# Patient Record
Sex: Female | Born: 1935 | Race: White | Hispanic: No | Marital: Married | Smoking: Former smoker
Health system: Southern US, Community
[De-identification: ages and names within clinical notes are randomized; demographics above are authoritative.]

## PROBLEM LIST (undated history)

## (undated) DIAGNOSIS — M48061 Spinal stenosis, lumbar region without neurogenic claudication: Secondary | ICD-10-CM

## (undated) DIAGNOSIS — Z972 Presence of dental prosthetic device (complete) (partial): Secondary | ICD-10-CM

## (undated) DIAGNOSIS — R112 Nausea with vomiting, unspecified: Secondary | ICD-10-CM

## (undated) DIAGNOSIS — Z973 Presence of spectacles and contact lenses: Secondary | ICD-10-CM

## (undated) DIAGNOSIS — K219 Gastro-esophageal reflux disease without esophagitis: Secondary | ICD-10-CM

## (undated) DIAGNOSIS — E1140 Type 2 diabetes mellitus with diabetic neuropathy, unspecified: Secondary | ICD-10-CM

## (undated) DIAGNOSIS — E7800 Pure hypercholesterolemia, unspecified: Secondary | ICD-10-CM

## (undated) DIAGNOSIS — E039 Hypothyroidism, unspecified: Secondary | ICD-10-CM

## (undated) DIAGNOSIS — Z9889 Other specified postprocedural states: Secondary | ICD-10-CM

## (undated) DIAGNOSIS — I10 Essential (primary) hypertension: Secondary | ICD-10-CM

## (undated) DIAGNOSIS — M1990 Unspecified osteoarthritis, unspecified site: Secondary | ICD-10-CM

## (undated) HISTORY — PX: DILATION AND CURETTAGE OF UTERUS: SHX78

## (undated) HISTORY — PX: KNEE ARTHROSCOPY W/ MENISCAL REPAIR: SHX1877

## (undated) HISTORY — PX: JOINT REPLACEMENT: SHX530

## (undated) HISTORY — PX: TOOTH EXTRACTION: SHX859

## (undated) HISTORY — PX: COLONOSCOPY: SHX174

## (undated) HISTORY — PX: COLONOSCOPY W/ POLYPECTOMY: SHX1380

---

## 1976-02-23 HISTORY — PX: ABDOMINAL HYSTERECTOMY: SHX81

## 2004-08-20 ENCOUNTER — Ambulatory Visit: Payer: Self-pay | Admitting: Unknown Physician Specialty

## 2005-10-07 ENCOUNTER — Ambulatory Visit: Payer: Self-pay | Admitting: Unknown Physician Specialty

## 2005-11-01 ENCOUNTER — Ambulatory Visit: Payer: Self-pay | Admitting: Gastroenterology

## 2006-09-27 ENCOUNTER — Ambulatory Visit: Payer: Self-pay | Admitting: Unknown Physician Specialty

## 2006-11-10 ENCOUNTER — Ambulatory Visit: Payer: Self-pay | Admitting: Unknown Physician Specialty

## 2008-01-04 ENCOUNTER — Ambulatory Visit: Payer: Self-pay | Admitting: Unknown Physician Specialty

## 2008-12-04 ENCOUNTER — Ambulatory Visit: Payer: Self-pay | Admitting: Unknown Physician Specialty

## 2009-01-07 ENCOUNTER — Ambulatory Visit: Payer: Self-pay | Admitting: Unknown Physician Specialty

## 2009-01-22 ENCOUNTER — Encounter: Admission: RE | Admit: 2009-01-22 | Discharge: 2009-01-22 | Payer: Self-pay | Admitting: Neurosurgery

## 2009-03-21 ENCOUNTER — Encounter: Admission: RE | Admit: 2009-03-21 | Discharge: 2009-03-21 | Payer: Self-pay | Admitting: Neurosurgery

## 2009-05-30 ENCOUNTER — Encounter: Admission: RE | Admit: 2009-05-30 | Discharge: 2009-05-30 | Payer: Self-pay | Admitting: Neurosurgery

## 2009-07-14 ENCOUNTER — Ambulatory Visit: Payer: Self-pay | Admitting: General Practice

## 2009-08-04 ENCOUNTER — Ambulatory Visit: Payer: Self-pay | Admitting: General Practice

## 2009-08-08 ENCOUNTER — Ambulatory Visit: Payer: Self-pay | Admitting: General Practice

## 2010-02-13 ENCOUNTER — Encounter
Admission: RE | Admit: 2010-02-13 | Discharge: 2010-02-13 | Payer: Self-pay | Source: Home / Self Care | Attending: Orthopedic Surgery | Admitting: Orthopedic Surgery

## 2010-02-18 ENCOUNTER — Ambulatory Visit: Payer: Self-pay | Admitting: Unknown Physician Specialty

## 2010-03-30 ENCOUNTER — Ambulatory Visit: Payer: Self-pay | Admitting: Anesthesiology

## 2010-04-03 ENCOUNTER — Ambulatory Visit: Payer: Self-pay | Admitting: General Practice

## 2010-11-16 ENCOUNTER — Ambulatory Visit: Payer: Self-pay | Admitting: General Practice

## 2010-12-02 ENCOUNTER — Inpatient Hospital Stay: Payer: Self-pay | Admitting: General Practice

## 2011-03-02 ENCOUNTER — Ambulatory Visit: Payer: Self-pay | Admitting: Unknown Physician Specialty

## 2011-06-09 ENCOUNTER — Ambulatory Visit: Payer: Self-pay | Admitting: Internal Medicine

## 2011-06-10 ENCOUNTER — Other Ambulatory Visit: Payer: Self-pay | Admitting: Rheumatology

## 2011-06-10 ENCOUNTER — Other Ambulatory Visit: Payer: Self-pay | Admitting: *Deleted

## 2011-06-10 DIAGNOSIS — M79651 Pain in right thigh: Secondary | ICD-10-CM

## 2011-06-15 ENCOUNTER — Ambulatory Visit
Admission: RE | Admit: 2011-06-15 | Discharge: 2011-06-15 | Disposition: A | Discharge status: Home / Self Care | Payer: Medicare Other | Source: Ambulatory Visit | Attending: *Deleted | Admitting: *Deleted

## 2011-06-15 DIAGNOSIS — M79651 Pain in right thigh: Secondary | ICD-10-CM

## 2011-06-30 ENCOUNTER — Other Ambulatory Visit: Payer: Self-pay | Admitting: Rheumatology

## 2011-06-30 DIAGNOSIS — M79651 Pain in right thigh: Secondary | ICD-10-CM

## 2011-07-08 ENCOUNTER — Other Ambulatory Visit: Payer: Self-pay | Admitting: Physical Medicine and Rehabilitation

## 2011-07-08 DIAGNOSIS — M545 Low back pain: Secondary | ICD-10-CM

## 2011-07-13 ENCOUNTER — Ambulatory Visit
Admission: RE | Admit: 2011-07-13 | Discharge: 2011-07-13 | Disposition: A | Discharge status: Home / Self Care | Payer: Medicare Other | Source: Ambulatory Visit | Attending: Physical Medicine and Rehabilitation | Admitting: Physical Medicine and Rehabilitation

## 2011-07-13 DIAGNOSIS — M545 Low back pain: Secondary | ICD-10-CM

## 2011-08-13 ENCOUNTER — Encounter (HOSPITAL_COMMUNITY): Payer: Self-pay | Admitting: Pharmacy Technician

## 2011-08-13 ENCOUNTER — Other Ambulatory Visit: Payer: Self-pay | Admitting: Neurosurgery

## 2011-08-16 ENCOUNTER — Encounter (HOSPITAL_COMMUNITY)
Admission: RE | Admit: 2011-08-16 | Discharge: 2011-08-16 | Disposition: A | Discharge status: Home / Self Care | Payer: Medicare Other | Source: Ambulatory Visit | Attending: Neurosurgery | Admitting: Neurosurgery

## 2011-08-16 ENCOUNTER — Encounter (HOSPITAL_COMMUNITY): Payer: Self-pay

## 2011-08-16 HISTORY — DX: Pure hypercholesterolemia, unspecified: E78.00

## 2011-08-16 HISTORY — DX: Other specified postprocedural states: Z98.890

## 2011-08-16 HISTORY — DX: Essential (primary) hypertension: I10

## 2011-08-16 HISTORY — DX: Other specified postprocedural states: R11.2

## 2011-08-16 HISTORY — DX: Gastro-esophageal reflux disease without esophagitis: K21.9

## 2011-08-16 HISTORY — DX: Unspecified osteoarthritis, unspecified site: M19.90

## 2011-08-16 LAB — BASIC METABOLIC PANEL
CO2: 26 mEq/L (ref 19–32)
Calcium: 10 mg/dL (ref 8.4–10.5)
Creatinine, Ser: 1.17 mg/dL — ABNORMAL HIGH (ref 0.50–1.10)
GFR calc Af Amer: 52 mL/min — ABNORMAL LOW (ref >90)
GFR calc non Af Amer: 45 mL/min — ABNORMAL LOW (ref >90)

## 2011-08-16 LAB — CBC
MCV: 89.3 fL (ref 78.0–100.0)
Platelets: 397 10*3/uL (ref 150–400)
RDW: 14.1 % (ref 11.5–15.5)
WBC: 9.7 10*3/uL (ref 4.0–10.5)

## 2011-08-16 LAB — SURGICAL PCR SCREEN: MRSA, PCR: NEGATIVE

## 2011-08-16 NOTE — Pre-Procedure Instructions (Signed)
22 Paula Hansen  08/16/2011   Your procedure is scheduled on:  Tuesday August 17, 2011.  Report to [facility] at 0830 AM.  Call this number if you have problems the morning of surgery: [phone]   Remember:   Do not eat food:After Midnight.    Take these medicines the morning of surgery with A SIP OF WATER: Esomeprazole (Nexium), Gabapentin (Neurontin), and Tapentadol.   Do not wear jewelry, make-up or nail polish.  Do not wear lotions, powders, or perfumes.   Do not shave 48 hours prior to surgery.   Do not bring valuables to the hospital.  Contacts, dentures or bridgework may not be worn into surgery.  Leave suitcase in the car. After surgery it may be brought to your room.  For patients admitted to the hospital, checkout time is 11:00 AM the day of discharge.   Patients discharged the day of surgery will not be allowed to drive home.  Name and phone number of your driver:   Special Instructions: CHG Shower Use Special Wash: 1/2 bottle night before surgery and 1/2 bottle morning of surgery.   Please read over the following fact sheets that you were given: Pain Booklet, Coughing and Deep Breathing, MRSA Information and Surgical Site Infection Prevention

## 2011-08-17 ENCOUNTER — Encounter (HOSPITAL_COMMUNITY): Payer: Self-pay | Admitting: *Deleted

## 2011-08-17 ENCOUNTER — Encounter (HOSPITAL_COMMUNITY): Payer: Self-pay | Admitting: Surgery

## 2011-08-17 ENCOUNTER — Inpatient Hospital Stay (HOSPITAL_COMMUNITY)
Admission: RE | Admit: 2011-08-17 | Discharge: 2011-08-18 | DRG: 491 | Disposition: A | Discharge status: Home / Self Care | Payer: Medicare Other | Source: Ambulatory Visit | Attending: Neurosurgery | Admitting: Neurosurgery

## 2011-08-17 ENCOUNTER — Encounter (HOSPITAL_COMMUNITY)
Admission: RE | Disposition: A | Discharge status: Home / Self Care | Payer: Self-pay | Source: Ambulatory Visit | Attending: Neurosurgery

## 2011-08-17 ENCOUNTER — Inpatient Hospital Stay (HOSPITAL_COMMUNITY): Payer: Medicare Other | Admitting: *Deleted

## 2011-08-17 ENCOUNTER — Inpatient Hospital Stay (HOSPITAL_COMMUNITY): Payer: Medicare Other

## 2011-08-17 DIAGNOSIS — E119 Type 2 diabetes mellitus without complications: Secondary | ICD-10-CM | POA: Diagnosis present

## 2011-08-17 DIAGNOSIS — Z01812 Encounter for preprocedural laboratory examination: Secondary | ICD-10-CM

## 2011-08-17 DIAGNOSIS — M5126 Other intervertebral disc displacement, lumbar region: Principal | ICD-10-CM | POA: Diagnosis present

## 2011-08-17 DIAGNOSIS — K219 Gastro-esophageal reflux disease without esophagitis: Secondary | ICD-10-CM | POA: Diagnosis present

## 2011-08-17 DIAGNOSIS — I10 Essential (primary) hypertension: Secondary | ICD-10-CM | POA: Diagnosis present

## 2011-08-17 HISTORY — PX: LUMBAR LAMINECTOMY/DECOMPRESSION MICRODISCECTOMY: SHX5026

## 2011-08-17 LAB — URINE MICROSCOPIC-ADD ON

## 2011-08-17 LAB — PROTIME-INR: INR: 0.98 (ref 0.00–1.49)

## 2011-08-17 LAB — DIFFERENTIAL
Basophils Relative: 0 % (ref 0–1)
Eosinophils Absolute: 0.1 10*3/uL (ref 0.0–0.7)
Eosinophils Relative: 1 % (ref 0–5)
Monocytes Relative: 12 % (ref 3–12)
Neutrophils Relative %: 70 % (ref 43–77)

## 2011-08-17 LAB — URINALYSIS, ROUTINE W REFLEX MICROSCOPIC
Glucose, UA: NEGATIVE mg/dL
Hgb urine dipstick: NEGATIVE
Ketones, ur: 15 mg/dL — AB
Nitrite: NEGATIVE
pH: 5.5 (ref 5.0–8.0)

## 2011-08-17 LAB — GLUCOSE, CAPILLARY
Glucose-Capillary: 139 mg/dL — ABNORMAL HIGH (ref 70–99)
Glucose-Capillary: 98 mg/dL (ref 70–99)

## 2011-08-17 SURGERY — LUMBAR LAMINECTOMY/DECOMPRESSION MICRODISCECTOMY 1 LEVEL
Anesthesia: General | Site: Back | Laterality: Right | Wound class: Clean

## 2011-08-17 MED ORDER — FENTANYL CITRATE 0.05 MG/ML IJ SOLN
INTRAMUSCULAR | Status: DC | PRN
  Administered 2011-08-17 (×2): 50 ug via INTRAVENOUS
  Administered 2011-08-17: 25 ug via INTRAVENOUS
  Administered 2011-08-17: 50 ug via INTRAVENOUS
  Administered 2011-08-17: 25 ug via INTRAVENOUS
  Administered 2011-08-17: 100 ug via INTRAVENOUS
  Administered 2011-08-17 (×2): 50 ug via INTRAVENOUS
  Administered 2011-08-17: 100 ug via INTRAVENOUS

## 2011-08-17 MED ORDER — HEMOSTATIC AGENTS (NO CHARGE) OPTIME
TOPICAL | Status: DC | PRN
  Administered 2011-08-17: 1 via TOPICAL

## 2011-08-17 MED ORDER — OXYCODONE-ACETAMINOPHEN 5-325 MG PO TABS: 1.0000 | ORAL_TABLET | ORAL | Status: DC | PRN

## 2011-08-17 MED ORDER — VANCOMYCIN HCL 1000 MG IV SOLR
750.0000 mg | Freq: Two times a day (BID) | INTRAVENOUS | Status: DC
  Administered 2011-08-18: 750 mg via INTRAVENOUS
  Filled 2011-08-17 (×4): qty 750

## 2011-08-17 MED ORDER — PROMETHAZINE HCL 25 MG/ML IJ SOLN: 12.5000 mg | INTRAMUSCULAR | Status: DC | PRN

## 2011-08-17 MED ORDER — METFORMIN HCL 500 MG PO TABS
500.0000 mg | ORAL_TABLET | Freq: Two times a day (BID) | ORAL | Status: DC
  Administered 2011-08-17 – 2011-08-18 (×2): 500 mg via ORAL
  Filled 2011-08-17 (×4): qty 1

## 2011-08-17 MED ORDER — CYCLOBENZAPRINE HCL 10 MG PO TABS: 10.0000 mg | ORAL_TABLET | Freq: Three times a day (TID) | ORAL | Status: DC | PRN

## 2011-08-17 MED ORDER — ROCURONIUM BROMIDE 100 MG/10ML IV SOLN
INTRAVENOUS | Status: DC | PRN
  Administered 2011-08-17: 50 mg via INTRAVENOUS

## 2011-08-17 MED ORDER — 0.9 % SODIUM CHLORIDE (POUR BTL) OPTIME
TOPICAL | Status: DC | PRN
  Administered 2011-08-17: 1000 mL

## 2011-08-17 MED ORDER — SODIUM CHLORIDE 0.9 % IV SOLN
INTRAVENOUS | Status: AC
  Filled 2011-08-17: qty 500

## 2011-08-17 MED ORDER — METHOCARBAMOL 100 MG/ML IJ SOLN
500.0000 mg | Freq: Four times a day (QID) | INTRAMUSCULAR | Status: DC | PRN
  Filled 2011-08-17: qty 5

## 2011-08-17 MED ORDER — PANTOPRAZOLE SODIUM 40 MG PO TBEC
80.0000 mg | DELAYED_RELEASE_TABLET | Freq: Every day | ORAL | Status: DC
  Administered 2011-08-18: 80 mg via ORAL
  Filled 2011-08-17: qty 2

## 2011-08-17 MED ORDER — DROPERIDOL 2.5 MG/ML IJ SOLN: 0.6250 mg | INTRAMUSCULAR | Status: DC | PRN

## 2011-08-17 MED ORDER — KETOROLAC TROMETHAMINE 30 MG/ML IJ SOLN
30.0000 mg | Freq: Four times a day (QID) | INTRAMUSCULAR | Status: DC
  Administered 2011-08-17 – 2011-08-18 (×3): 30 mg via INTRAVENOUS
  Filled 2011-08-17 (×4): qty 1

## 2011-08-17 MED ORDER — BENAZEPRIL HCL 20 MG PO TABS
20.0000 mg | ORAL_TABLET | Freq: Every day | ORAL | Status: DC
  Administered 2011-08-17 – 2011-08-18 (×2): 20 mg via ORAL
  Filled 2011-08-17 (×2): qty 1

## 2011-08-17 MED ORDER — METHOCARBAMOL 500 MG PO TABS
500.0000 mg | ORAL_TABLET | Freq: Four times a day (QID) | ORAL | Status: DC | PRN
  Administered 2011-08-17: 500 mg via ORAL
  Filled 2011-08-17: qty 1

## 2011-08-17 MED ORDER — GABAPENTIN 300 MG PO CAPS
300.0000 mg | ORAL_CAPSULE | Freq: Three times a day (TID) | ORAL | Status: DC
  Administered 2011-08-17 – 2011-08-18 (×3): 300 mg via ORAL
  Filled 2011-08-17 (×5): qty 1

## 2011-08-17 MED ORDER — ADULT MULTIVITAMIN W/MINERALS CH
1.0000 | ORAL_TABLET | Freq: Every day | ORAL | Status: DC
  Administered 2011-08-17 – 2011-08-18 (×2): 1 via ORAL
  Filled 2011-08-17 (×2): qty 1

## 2011-08-17 MED ORDER — HYDROMORPHONE HCL PF 1 MG/ML IJ SOLN
0.2500 mg | INTRAMUSCULAR | Status: DC | PRN
  Administered 2011-08-17: 0.5 mg via INTRAVENOUS

## 2011-08-17 MED ORDER — GLYCOPYRROLATE 0.2 MG/ML IJ SOLN
INTRAMUSCULAR | Status: DC | PRN
  Administered 2011-08-17: .4 mg via INTRAVENOUS

## 2011-08-17 MED ORDER — ATORVASTATIN CALCIUM 20 MG PO TABS
20.0000 mg | ORAL_TABLET | Freq: Every day | ORAL | Status: DC
  Filled 2011-08-17 (×2): qty 1

## 2011-08-17 MED ORDER — AMLODIPINE BESYLATE 10 MG PO TABS
10.0000 mg | ORAL_TABLET | Freq: Every day | ORAL | Status: DC
  Administered 2011-08-17 – 2011-08-18 (×2): 10 mg via ORAL
  Filled 2011-08-17 (×2): qty 1

## 2011-08-17 MED ORDER — NEOSTIGMINE METHYLSULFATE 1 MG/ML IJ SOLN
INTRAMUSCULAR | Status: DC | PRN
  Administered 2011-08-17: 3 mg via INTRAVENOUS

## 2011-08-17 MED ORDER — ONDANSETRON HCL 4 MG/2ML IJ SOLN
INTRAMUSCULAR | Status: DC | PRN
  Administered 2011-08-17: 4 mg via INTRAVENOUS

## 2011-08-17 MED ORDER — ZOLPIDEM TARTRATE 5 MG PO TABS: 10.0000 mg | ORAL_TABLET | Freq: Every evening | ORAL | Status: DC | PRN

## 2011-08-17 MED ORDER — GLIPIZIDE 5 MG PO TABS
5.0000 mg | ORAL_TABLET | Freq: Two times a day (BID) | ORAL | Status: DC
  Administered 2011-08-17 – 2011-08-18 (×2): 5 mg via ORAL
  Filled 2011-08-17 (×4): qty 1

## 2011-08-17 MED ORDER — SODIUM CHLORIDE 0.9 % IJ SOLN: 3.0000 mL | INTRAMUSCULAR | Status: DC | PRN

## 2011-08-17 MED ORDER — ONDANSETRON HCL 4 MG/2ML IJ SOLN: 4.0000 mg | INTRAMUSCULAR | Status: DC | PRN

## 2011-08-17 MED ORDER — AMLODIPINE BESY-BENAZEPRIL HCL 10-20 MG PO CAPS: 1.0000 | ORAL_CAPSULE | Freq: Every day | ORAL | Status: DC

## 2011-08-17 MED ORDER — ACETAMINOPHEN 325 MG PO TABS: 650.0000 mg | ORAL_TABLET | ORAL | Status: DC | PRN

## 2011-08-17 MED ORDER — MIDAZOLAM HCL 5 MG/5ML IJ SOLN
INTRAMUSCULAR | Status: DC | PRN
  Administered 2011-08-17 (×2): 1 mg via INTRAVENOUS

## 2011-08-17 MED ORDER — POTASSIUM CHLORIDE IN NACL 20-0.9 MEQ/L-% IV SOLN
INTRAVENOUS | Status: DC
  Filled 2011-08-17 (×3): qty 1000

## 2011-08-17 MED ORDER — TRAMADOL-ACETAMINOPHEN 37.5-325 MG PO TABS
1.0000 | ORAL_TABLET | ORAL | Status: DC | PRN
  Administered 2011-08-17: 1 via ORAL
  Filled 2011-08-17: qty 2

## 2011-08-17 MED ORDER — FENTANYL 25 MCG/HR TD PT72
25.0000 ug | MEDICATED_PATCH | TRANSDERMAL | Status: DC
  Administered 2011-08-17: 25 ug via TRANSDERMAL
  Filled 2011-08-17: qty 1

## 2011-08-17 MED ORDER — DOCUSATE SODIUM 100 MG PO CAPS
100.0000 mg | ORAL_CAPSULE | Freq: Two times a day (BID) | ORAL | Status: DC
  Administered 2011-08-17 – 2011-08-18 (×3): 100 mg via ORAL
  Filled 2011-08-17 (×3): qty 1

## 2011-08-17 MED ORDER — HYDROCODONE-ACETAMINOPHEN 5-325 MG PO TABS: 1.0000 | ORAL_TABLET | ORAL | Status: DC | PRN

## 2011-08-17 MED ORDER — MAGNESIUM HYDROXIDE 400 MG/5ML PO SUSP: 30.0000 mL | Freq: Every day | ORAL | Status: DC | PRN

## 2011-08-17 MED ORDER — LIDOCAINE HCL (CARDIAC) 20 MG/ML IV SOLN
INTRAVENOUS | Status: DC | PRN
  Administered 2011-08-17: 100 mg via INTRAVENOUS

## 2011-08-17 MED ORDER — PROPOFOL 10 MG/ML IV EMUL
INTRAVENOUS | Status: DC | PRN
  Administered 2011-08-17: 180 mg via INTRAVENOUS

## 2011-08-17 MED ORDER — THROMBIN 5000 UNITS EX KIT
PACK | CUTANEOUS | Status: DC | PRN
  Administered 2011-08-17 (×2): 5000 [IU] via TOPICAL

## 2011-08-17 MED ORDER — SODIUM CHLORIDE 0.9 % IR SOLN
Status: DC | PRN
  Administered 2011-08-17: 13:00:00

## 2011-08-17 MED ORDER — ACETAMINOPHEN 650 MG RE SUPP: 650.0000 mg | RECTAL | Status: DC | PRN

## 2011-08-17 MED ORDER — HYDROMORPHONE HCL PF 1 MG/ML IJ SOLN
INTRAMUSCULAR | Status: AC
  Filled 2011-08-17: qty 1

## 2011-08-17 MED ORDER — VANCOMYCIN HCL IN DEXTROSE 1-5 GM/200ML-% IV SOLN
INTRAVENOUS | Status: AC
  Administered 2011-08-17: 1000 mg via INTRAVENOUS
  Filled 2011-08-17: qty 200

## 2011-08-17 MED ORDER — VANCOMYCIN HCL 750 MG IV SOLR: 750.0000 mg | Freq: Two times a day (BID) | INTRAVENOUS | Status: DC

## 2011-08-17 MED ORDER — SODIUM CHLORIDE 0.9 % IJ SOLN
3.0000 mL | Freq: Two times a day (BID) | INTRAMUSCULAR | Status: DC
  Administered 2011-08-17 – 2011-08-18 (×3): 3 mL via INTRAVENOUS

## 2011-08-17 MED ORDER — BACITRACIN 50000 UNITS IM SOLR
INTRAMUSCULAR | Status: AC
  Filled 2011-08-17: qty 1

## 2011-08-17 MED ORDER — DEXTROSE 5 % IV SOLN
INTRAVENOUS | Status: DC | PRN
  Administered 2011-08-17: 12:00:00 via INTRAVENOUS

## 2011-08-17 MED ORDER — LACTATED RINGERS IV SOLN
INTRAVENOUS | Status: DC | PRN
  Administered 2011-08-17 (×2): via INTRAVENOUS

## 2011-08-17 MED ORDER — LIDOCAINE-EPINEPHRINE 1 %-1:100000 IJ SOLN
INTRAMUSCULAR | Status: DC | PRN
  Administered 2011-08-17: 20 mL

## 2011-08-17 MED ORDER — ASPIRIN EC 81 MG PO TBEC
81.0000 mg | DELAYED_RELEASE_TABLET | Freq: Every day | ORAL | Status: DC
  Administered 2011-08-17 – 2011-08-18 (×2): 81 mg via ORAL
  Filled 2011-08-17 (×2): qty 1

## 2011-08-17 MED ORDER — CEFAZOLIN SODIUM 1-5 GM-% IV SOLN: 1.0000 g | INTRAVENOUS | Status: DC

## 2011-08-17 MED ORDER — KETOROLAC TROMETHAMINE 30 MG/ML IJ SOLN
INTRAMUSCULAR | Status: AC
  Administered 2011-08-17: 30 mg
  Filled 2011-08-17: qty 1

## 2011-08-17 MED ORDER — LORATADINE 10 MG PO TABS
10.0000 mg | ORAL_TABLET | Freq: Every day | ORAL | Status: DC
  Administered 2011-08-17 – 2011-08-18 (×2): 10 mg via ORAL
  Filled 2011-08-17 (×2): qty 1

## 2011-08-17 MED ORDER — PROMETHAZINE HCL 25 MG PO TABS: 12.5000 mg | ORAL_TABLET | ORAL | Status: DC | PRN

## 2011-08-17 SURGICAL SUPPLY — 53 items
BAG DECANTER FOR FLEXI CONT (MISCELLANEOUS) ×2 IMPLANT
BENZOIN TINCTURE PRP APPL 2/3 (GAUZE/BANDAGES/DRESSINGS) ×2 IMPLANT
BLADE SURG ROTATE 9660 (MISCELLANEOUS) IMPLANT
BUR ROUND FLUTED 5 RND (BURR) ×2 IMPLANT
CANISTER SUCTION 2500CC (MISCELLANEOUS) ×2 IMPLANT
CLOTH BEACON ORANGE TIMEOUT ST (SAFETY) ×2 IMPLANT
CONT SPEC 4OZ CLIKSEAL STRL BL (MISCELLANEOUS) ×2 IMPLANT
DRAPE LAPAROTOMY 100X72X124 (DRAPES) ×2 IMPLANT
DRAPE MICROSCOPE LEICA (MISCELLANEOUS) IMPLANT
DRAPE MICROSCOPE ZEISS OPMI (DRAPES) ×2 IMPLANT
DRAPE POUCH INSTRU U-SHP 10X18 (DRAPES) ×2 IMPLANT
DRAPE SURG 17X23 STRL (DRAPES) ×2 IMPLANT
DRESSING TELFA 8X3 (GAUZE/BANDAGES/DRESSINGS) ×2 IMPLANT
DURAPREP 26ML APPLICATOR (WOUND CARE) ×2 IMPLANT
ELECT REM PT RETURN 9FT ADLT (ELECTROSURGICAL) ×2
ELECTRODE REM PT RTRN 9FT ADLT (ELECTROSURGICAL) ×1 IMPLANT
GAUZE SPONGE 4X4 16PLY XRAY LF (GAUZE/BANDAGES/DRESSINGS) IMPLANT
GLOVE ECLIPSE 7.5 STRL STRAW (GLOVE) ×6 IMPLANT
GLOVE EXAM NITRILE LRG STRL (GLOVE) IMPLANT
GLOVE EXAM NITRILE MD LF STRL (GLOVE) IMPLANT
GLOVE EXAM NITRILE XL STR (GLOVE) IMPLANT
GLOVE EXAM NITRILE XS STR PU (GLOVE) IMPLANT
GLOVE INDICATOR 7.0 STRL GRN (GLOVE) ×6 IMPLANT
GLOVE INDICATOR 7.5 STRL GRN (GLOVE) ×2 IMPLANT
GLOVE SURG SS PI 6.5 STRL IVOR (GLOVE) ×4 IMPLANT
GLOVE SURG SS PI 7.0 STRL IVOR (GLOVE) ×4 IMPLANT
GOWN BRE IMP SLV AUR LG STRL (GOWN DISPOSABLE) ×4 IMPLANT
GOWN BRE IMP SLV AUR XL STRL (GOWN DISPOSABLE) ×8 IMPLANT
GOWN STRL REIN 2XL LVL4 (GOWN DISPOSABLE) IMPLANT
KIT BASIN OR (CUSTOM PROCEDURE TRAY) ×2 IMPLANT
KIT ROOM TURNOVER OR (KITS) ×2 IMPLANT
NEEDLE HYPO 18GX1.5 BLUNT FILL (NEEDLE) IMPLANT
NEEDLE HYPO 22GX1.5 SAFETY (NEEDLE) ×4 IMPLANT
NS IRRIG 1000ML POUR BTL (IV SOLUTION) ×2 IMPLANT
PACK LAMINECTOMY NEURO (CUSTOM PROCEDURE TRAY) ×2 IMPLANT
PAD ARMBOARD 7.5X6 YLW CONV (MISCELLANEOUS) ×6 IMPLANT
PATTIES SURGICAL .75X.75 (GAUZE/BANDAGES/DRESSINGS) ×2 IMPLANT
RUBBERBAND STERILE (MISCELLANEOUS) ×4 IMPLANT
SPONGE GAUZE 4X4 12PLY (GAUZE/BANDAGES/DRESSINGS) ×2 IMPLANT
SPONGE LAP 4X18 X RAY DECT (DISPOSABLE) IMPLANT
SPONGE SURGIFOAM ABS GEL SZ50 (HEMOSTASIS) ×2 IMPLANT
STRIP CLOSURE SKIN 1/2X4 (GAUZE/BANDAGES/DRESSINGS) ×2 IMPLANT
SUT PROLENE 6 0 BV (SUTURE) IMPLANT
SUT VIC AB 0 CT1 18XCR BRD8 (SUTURE) ×1 IMPLANT
SUT VIC AB 0 CT1 8-18 (SUTURE) ×1
SUT VIC AB 2-0 CP2 18 (SUTURE) ×2 IMPLANT
SUT VIC AB 3-0 SH 8-18 (SUTURE) ×2 IMPLANT
SYR 20CC LL (SYRINGE) ×2 IMPLANT
SYR 5ML LL (SYRINGE) IMPLANT
TAPE CLOTH SURG 4X10 WHT LF (GAUZE/BANDAGES/DRESSINGS) ×2 IMPLANT
TOWEL OR 17X24 6PK STRL BLUE (TOWEL DISPOSABLE) ×2 IMPLANT
TOWEL OR 17X26 10 PK STRL BLUE (TOWEL DISPOSABLE) ×2 IMPLANT
WATER STERILE IRR 1000ML POUR (IV SOLUTION) ×2 IMPLANT

## 2011-08-17 NOTE — Transfer of Care (Signed)
Immediate Anesthesia Transfer of Care Note  Patient: Paula Hansen  Procedure(s) Performed: Procedure(s) (LRB): LUMBAR LAMINECTOMY/DECOMPRESSION MICRODISCECTOMY 1 LEVEL (Right)  Patient Location: PACU  Anesthesia Type: General  Level of Consciousness: awake, alert  and oriented  Airway & Oxygen Therapy: Patient Spontanous Breathing and Patient connected to nasal cannula oxygen  Post-op Assessment: Report given to PACU RN and Post -op Vital signs reviewed and stable  Post vital signs: Reviewed and stable  Complications: No apparent anesthesia complications

## 2011-08-17 NOTE — Anesthesia Preprocedure Evaluation (Signed)
Anesthesia Evaluation  Patient identified by MRN, date of birth, ID band Patient awake    Reviewed: Allergy & Precautions, H&P , NPO status , Patient's Chart, lab work & pertinent test results  History of Anesthesia Complications (+) PONV  Airway Mallampati: I TM Distance: >3 FB Neck ROM: Full    Dental  (+) Teeth Intact, Caps, Missing and Dental Advisory Given   Pulmonary  breath sounds clear to auscultation  Pulmonary exam normal       Cardiovascular hypertension, Pt. on medications Rhythm:Regular Rate:Normal     Neuro/Psych negative neurological ROS     GI/Hepatic Neg liver ROS, GERD-  Medicated and Controlled,  Endo/Other  Diabetes mellitus-, Type 2, Oral Hypoglycemic Agents  Renal/GU negative Renal ROS     Musculoskeletal   Abdominal   Peds  Hematology   Anesthesia Other Findings   Reproductive/Obstetrics                           Anesthesia Physical Anesthesia Plan  ASA: III  Anesthesia Plan: General   Post-op Pain Management:    Induction: Intravenous  Airway Management Planned: Oral ETT  Additional Equipment:   Intra-op Plan:   Post-operative Plan:   Informed Consent: I have reviewed the patients History and Physical, chart, labs and discussed the procedure including the risks, benefits and alternatives for the proposed anesthesia with the patient or authorized representative who has indicated his/her understanding and acceptance.   Dental advisory given  Plan Discussed with: CRNA, Anesthesiologist and Surgeon  Anesthesia Plan Comments:         Anesthesia Quick Evaluation

## 2011-08-17 NOTE — H&P (Signed)
See H& P.

## 2011-08-17 NOTE — Preoperative (Signed)
Beta Blockers   Reason not to administer Beta Blockers:Not Applicable 

## 2011-08-17 NOTE — Op Note (Signed)
08/17/2011  1:44 PM  PATIENT:  Paula Hansen  77 y.o. female  PRE-OPERATIVE DIAGNOSIS:  Lumbar spondylosis, HNP, radiculopathy Right L2-3  POST-OPERATIVE DIAGNOSIS:  Lumbar spondylosis, HNP, radiculopathy Right L2-3  PROCEDURE:  Procedure(s): LUMBAR LAMINECTOMY/DECOMPRESSION MICRODISCECTOMY 1 LEVEL, right L2-3, microdisection  SURGEON:  Surgeon(s): Clydene Fake, MD    ANESTHESIA:   general  EBL:  Total I/O In: 1800 [I.V.:1800] Out: 25 [Blood:25]  BLOOD ADMINISTERED:none  DRAINS: none   SPECIMEN:  No Specimen  DICTATION: Patient with back and right leg pain rating and anterior thigh to the knee. MRI was done showing a large disc herniation at L2-3 after discussion options for her surgery for lamina discectomy she did have some weakness in right hip flexion reflex on the right side.  Patient brought into operating room general anesthesia induced patient placed in prone position Wilson frame all pressure points padded. She was prepped draped sterile fashion 7 incision injected 19 cc 1% lidocaine with epinephrine. He was placed in interspace x-rays obtained and he was putting at the 23 level incision was made centered with needle was incision taken the fascia hemostasis obtained with Bovie cauterization fascia incised and subperiosteal dissection done with 2 and 3 spinous process lamina to the facets are became retractors placed markers placed interspace another x-ray was obtained confirming or positioning at L2-3. Microscope was brought in for microdissection this point. High-speed drill was used to starting semi-hemilaminectomy medial facetectomy is completed with Kerrison punches. Explored the epidural space and found large free fragments of disc upon the L3 nerve root to caudal to the to 3 disc space these were removed with hooks and his removing this work her way up into the disc space were was a large annular tear and we entered the to incised the space without having incised and  discectomy done with pituitary rongeurs and curettes. More foraminotomy over the L3 roots we then checked the area with hooks we did decompression central canal and the L3 nerve root well decompressed. Good hemostasis we. About solution retractors removed fascia closed with 0 Vicryl interrupted sutures excess tissue closed with 021 through Vicryl interrupted sutures skin closed benzoin Steri-Strips dressing was placed patient placed back in the supine position woken from anesthesia and transferred to recovery room.  PLAN OF CARE: Admit to inpatient   PATIENT DISPOSITION:  PACU - hemodynamically stable.

## 2011-08-17 NOTE — Anesthesia Procedure Notes (Signed)
Procedure Name: Intubation Date/Time: 08/17/2011 11:52 AM Performed by: Quentin Ore Pre-anesthesia Checklist: Patient identified, Emergency Drugs available, Suction available, Patient being monitored and Timeout performed Patient Re-evaluated:Patient Re-evaluated prior to inductionOxygen Delivery Method: Circle system utilized Preoxygenation: Pre-oxygenation with 100% oxygen Intubation Type: IV induction Ventilation: Mask ventilation without difficulty Laryngoscope Size: Miller and 2 Grade View: Grade I Tube type: Oral Tube size: 7.0 mm Number of attempts: 1 Airway Equipment and Method: Stylet and LTA kit utilized Placement Confirmation: ETT inserted through vocal cords under direct vision,  positive ETCO2 and breath sounds checked- equal and bilateral Secured at: 21 cm Tube secured with: Tape Dental Injury: Teeth and Oropharynx as per pre-operative assessment

## 2011-08-17 NOTE — Consult Note (Signed)
ANTIBIOTIC CONSULT NOTE - INITIAL  Pharmacy Consult for Vancomycin Indication: s/p Lumbar Laminectomy/Decompression  Allergies  Allergen Reactions  . Amoxicillin Er Other (See Comments)    unknown  . Codeine Other (See Comments)    Chest pain  . Morphine And Related Nausea And Vomiting  . Oxycodone Nausea And Vomiting  . Shellfish Allergy Nausea And Vomiting  . Sulfa Antibiotics Other (See Comments)    unknown    Patient Measurements: Height: 5\' 4"  (162.6 cm) Weight: 156 lb (70.761 kg) IBW/kg (Calculated) : 54.7    Vital Signs: Temp: 97.5 F (36.4 C) (06/25 1455) Temp src: Oral (06/25 1455) BP: 163/68 mmHg (06/25 1455) Pulse Rate: 92  (06/25 1455)  Labs:  Basename 08/16/11 1420  WBC 9.7  HGB 13.0  PLT 397  LABCREA --  CREATININE 1.17*   Estimated Creatinine Clearance: 40.7 ml/min (by C-G formula based on Cr of 1.17).  Microbiology: Recent Results (from the past 720 hour(s))  SURGICAL PCR SCREEN     Status: Normal   Collection Time   08/16/11  2:23 PM      Component Value Range Status Comment   MRSA, PCR NEGATIVE  NEGATIVE Final    Staphylococcus aureus NEGATIVE  NEGATIVE Final     Medical History: Past Medical History  Diagnosis Date  . PONV (postoperative nausea and vomiting)   . Hypertension   . Arthritis   . Diabetes mellitus   . GERD (gastroesophageal reflux disease)   . Hypercholesteremia     Medications:  Prescriptions prior to admission  Medication Sig Dispense Refill  . amLODipine-benazepril (LOTREL) 10-20 MG per capsule Take 1 capsule by mouth daily.      Marland Kitchen aspirin EC 81 MG tablet Take 81 mg by mouth daily.      . Calcium Carbonate-Vit D-Min (CALTRATE PLUS PO) Take 1 tablet by mouth daily.      . cetirizine (ZYRTEC) 10 MG tablet Take 10 mg by mouth daily.      . Coenzyme Q10 (CO Q 10 PO) Take 1 tablet by mouth daily.      Marland Kitchen esomeprazole (NEXIUM) 40 MG capsule Take 40 mg by mouth daily before breakfast.      . fentaNYL (DURAGESIC - DOSED  MCG/HR) 50 MCG/HR Place 0.5 patches onto the skin every 3 (three) days.      Marland Kitchen FLAXSEED, LINSEED, PO Take 1 capsule by mouth daily.      Marland Kitchen gabapentin (NEURONTIN) 300 MG capsule Take 300 mg by mouth 3 (three) times daily.      Marland Kitchen glipiZIDE (GLUCOTROL) 5 MG tablet Take 5 mg by mouth 2 (two) times daily before a meal.      . metFORMIN (GLUCOPHAGE) 500 MG tablet Take 500 mg by mouth 2 (two) times daily with a meal.      . Multiple Vitamin (MULTIVITAMIN WITH MINERALS) TABS Take 1 tablet by mouth daily.      . rosuvastatin (CRESTOR) 10 MG tablet Take 10 mg by mouth every other day.      . Tapentadol HCl (NUCYNTA) 100 MG TABS Take 1 tablet by mouth 3 (three) times daily.      Marland Kitchen VITAMIN D, ERGOCALCIFEROL, PO Take 1 tablet by mouth daily.      Marland Kitchen VITAMIN E PO Take 1 capsule by mouth daily.       Scheduled:    . amLODipine  10 mg Oral Daily  . aspirin EC  81 mg Oral Daily  . atorvastatin  20 mg Oral  [id number]  . bacitracin      . benazepril  20 mg Oral Daily  . docusate sodium  100 mg Oral BID  . fentaNYL  25 mcg Transdermal Q72H  . gabapentin  300 mg Oral TID  . glipiZIDE  5 mg Oral BID AC  . HYDROmorphone      . ketorolac  30 mg Intravenous Q6H  . ketorolac      . loratadine  10 mg Oral Daily  . metFORMIN  500 mg Oral BID WC  . multivitamin with minerals  1 tablet Oral Daily  . pantoprazole  80 mg Oral Q1200  . sodium chloride      . sodium chloride  3 mL Intravenous Q12H  . vancomycin      . DISCONTD: amLODipine-benazepril  1 capsule Oral Daily  . DISCONTD:  ceFAZolin (ANCEF) IV  1 g Intravenous 60 min Pre-Op   Infusions:    . 0.9 % NaCl with KCl 20 mEq / L     Anti-infectives     Start     Dose/Rate Route Frequency Ordered Stop   08/17/11 1242   bacitracin 50,000 Units in sodium chloride irrigation 0.9 % 500 mL irrigation  Status:  Discontinued          As needed 08/17/11 1242 08/17/11 1335   08/17/11 1136   bacitracin 60454 UNITS injection     Comments: DAY, DORY: cabinet override           08/17/11 1136 08/17/11 2344   08/17/11 1128   vancomycin (VANCOCIN) 1 GM/200ML IVPB     Comments: MIRARCHI, ANGIE: cabinet override         08/17/11 1128 08/17/11 1155   08/17/11 0917   ceFAZolin (ANCEF) IVPB 1 g/50 mL premix  Status:  Discontinued        1 g 100 mL/hr over 30 Minutes Intravenous 60 min pre-op 08/17/11 0981 08/17/11 1443          Assessment:  77 y/o female s/p Lumbar Laminectomy/Decompression Microdiscectomy.  Vancomycin ordered post-op x 3 doses (36 hrs post-op antibiotic coverage).  Dose will need renal adjustment.  Estimated Creatinine Clearance: 40.7 ml/min (by C-G formula based on Cr of 1.17).   Goal of Therapy:   Vancomycin trough level 15-20 mcg/ml  Plan:   Vancomycin 750 mg IV q 12 hours x 3 doses, then discontinue.  Jaaziel Peatross, Elisha Headland, Pharm.D. 08/17/2011 3:32 PM

## 2011-08-17 NOTE — Plan of Care (Signed)
Problem: Consults Goal: Diagnosis - Spinal Surgery Outcome: Completed/Met Date Met:  08/17/11 Lumbar Laminectomy (Complex)

## 2011-08-17 NOTE — Anesthesia Postprocedure Evaluation (Signed)
Anesthesia Post Note  Patient: Paula Hansen  Procedure(s) Performed: Procedure(s) (LRB): LUMBAR LAMINECTOMY/DECOMPRESSION MICRODISCECTOMY 1 LEVEL (Right)  Anesthesia type: general  Patient location: PACU  Post pain: Pain level controlled  Post assessment: Patient's Cardiovascular Status Stable  Last Vitals:  Filed Vitals:   08/17/11 1615  BP: 157/69  Pulse: 93  Temp: 36.9 C  Resp: 18    Post vital signs: Reviewed and stable  Level of consciousness: sedated  Complications: No apparent anesthesia complications

## 2011-08-17 NOTE — Interval H&P Note (Signed)
History and Physical Interval Note:  08/17/2011 11:08 AM  Paula Hansen  has presented today for surgery, with the diagnosis of Lumbar spondylosis  The various methods of treatment have been discussed with the patient and family. After consideration of risks, benefits and other options for treatment, the patient has consented to  Procedure(s) (LRB): LUMBAR LAMINECTOMY/DECOMPRESSION MICRODISCECTOMY 1 LEVEL (Right) as a surgical intervention .  The patient's history has been reviewed, patient examined, no change in status, stable for surgery.  I have reviewed the patients' chart and labs.  Questions were answered to the patient's satisfaction.     [name] R

## 2011-08-18 MED ORDER — TRAMADOL-ACETAMINOPHEN 37.5-325 MG PO TABS: 1.0000 | ORAL_TABLET | ORAL | Status: AC | PRN

## 2011-08-18 NOTE — Discharge Instructions (Signed)
Wound Care Keep incision covered and dry for one week.  If you shower prior to then, cover incision with plastic wrap.  You may remove outer bandage after one week and shower.  Do not put any creams, lotions, or ointments on incision. Leave steri-strips on.  They will fall off by themselves. Activity Walk each and every day, increasing distance each day. No lifting greater than 5 lbs.  Avoid bending, arching, or twisting. No driving for 2 weeks; may ride as a passenger locally. If provided with back brace, wear when out of bed.  It is not necessary to wear brace in bed. Diet Resume your normal diet.  Return to Work Will be discussed at you follow up appointment. Call Your Doctor If Any of These Occur Redness, drainage, or swelling at the wound.  Temperature greater than 101 degrees. Severe pain not relieved by pain medication. Incision starts to come apart. Follow Up Appt Call today for appointment in 3-4 weeks ([phone]) or for problems.  If you have any hardware placed in your spine, you will need an x-ray before your appointment.

## 2011-08-18 NOTE — Discharge Summary (Signed)
Physician Discharge Summary  Patient ID: Paula Hansen MRN: [id number] DOB/AGE: [birth year] 77 y.o.  Admit date: 08/17/2011 Discharge date: 08/18/2011  Admission Diagnoses:Lumbar spondylosis, HNP, radiculopathy Right L2-3   Discharge Diagnoses: Lumbar spondylosis, HNP, radiculopathy Right L2-3  Active Problems:  * No active hospital problems. *    Discharged Condition: good  Hospital Course: pt admitted day of surgery - had procedure below - pt with much less leg pain - ambulating well - voiding and eating well  Consults: None  Significant Diagnostic Studies: none  Treatments: surgery: LUMBAR LAMINECTOMY/DECOMPRESSION MICRODISCECTOMY 1 LEVEL, right L2-3, microdisection   Discharge Exam: Blood pressure 119/57, pulse 88, temperature 98.9 F (37.2 C), temperature source Oral, resp. rate 16, height 5\' 4"  (1.626 m), weight 70.761 kg (156 lb), SpO2 94.00%. Wound:c/d/i  Disposition: home   Medication List  As of 08/18/2011  8:19 AM   STOP taking these medications         fentaNYL 50 MCG/HR         TAKE these medications         amLODipine-benazepril 10-20 MG per capsule   Commonly known as: LOTREL   Take 1 capsule by mouth daily.      aspirin EC 81 MG tablet   Take 81 mg by mouth daily.      CALTRATE PLUS PO   Take 1 tablet by mouth daily.      cetirizine 10 MG tablet   Commonly known as: ZYRTEC   Take 10 mg by mouth daily.      CO Q 10 PO   Take 1 tablet by mouth daily.      esomeprazole 40 MG capsule   Commonly known as: NEXIUM   Take 40 mg by mouth daily before breakfast.      FLAXSEED (LINSEED) PO   Take 1 capsule by mouth daily.      gabapentin 300 MG capsule   Commonly known as: NEURONTIN   Take 300 mg by mouth 3 (three) times daily.      glipiZIDE 5 MG tablet   Commonly known as: GLUCOTROL   Take 5 mg by mouth 2 (two) times daily before a meal.      metFORMIN 500 MG tablet   Commonly known as: GLUCOPHAGE   Take 500 mg by mouth 2 (two) times  daily with a meal.      multivitamin with minerals Tabs   Take 1 tablet by mouth daily.      NUCYNTA 100 MG Tabs   Generic drug: Tapentadol HCl   Take 1 tablet by mouth 3 (three) times daily.      rosuvastatin 10 MG tablet   Commonly known as: CRESTOR   Take 10 mg by mouth every other day.      traMADol-acetaminophen 37.5-325 MG per tablet   Commonly known as: ULTRACET   Take 1-2 tablets by mouth every 4 (four) hours as needed for pain.      VITAMIN D (ERGOCALCIFEROL) PO   Take 1 tablet by mouth daily.      VITAMIN E PO   Take 1 capsule by mouth daily.             SignedClydene Fake, MD 08/18/2011, 8:19 AM

## 2011-08-19 ENCOUNTER — Encounter (HOSPITAL_COMMUNITY): Payer: Self-pay | Admitting: Neurosurgery

## 2012-06-09 ENCOUNTER — Ambulatory Visit: Payer: Self-pay | Admitting: Unknown Physician Specialty

## 2013-08-13 DIAGNOSIS — E039 Hypothyroidism, unspecified: Secondary | ICD-10-CM | POA: Insufficient documentation

## 2013-08-22 ENCOUNTER — Ambulatory Visit: Payer: Self-pay | Admitting: Internal Medicine

## 2014-02-06 DIAGNOSIS — K219 Gastro-esophageal reflux disease without esophagitis: Secondary | ICD-10-CM | POA: Insufficient documentation

## 2014-02-06 DIAGNOSIS — E782 Mixed hyperlipidemia: Secondary | ICD-10-CM | POA: Insufficient documentation

## 2014-08-19 DIAGNOSIS — E119 Type 2 diabetes mellitus without complications: Secondary | ICD-10-CM | POA: Insufficient documentation

## 2014-08-20 ENCOUNTER — Other Ambulatory Visit: Payer: Self-pay | Admitting: Internal Medicine

## 2014-08-20 DIAGNOSIS — Z1231 Encounter for screening mammogram for malignant neoplasm of breast: Secondary | ICD-10-CM

## 2014-08-29 ENCOUNTER — Other Ambulatory Visit: Payer: Self-pay | Admitting: Internal Medicine

## 2014-08-29 ENCOUNTER — Ambulatory Visit
Admission: RE | Admit: 2014-08-29 | Discharge: 2014-08-29 | Disposition: A | Discharge status: Home / Self Care | Payer: Medicare Other | Source: Ambulatory Visit | Attending: Internal Medicine | Admitting: Internal Medicine

## 2014-08-29 DIAGNOSIS — Z1231 Encounter for screening mammogram for malignant neoplasm of breast: Secondary | ICD-10-CM

## 2014-12-01 DIAGNOSIS — Z96652 Presence of left artificial knee joint: Secondary | ICD-10-CM | POA: Insufficient documentation

## 2015-09-04 ENCOUNTER — Other Ambulatory Visit: Payer: Self-pay | Admitting: Neurological Surgery

## 2015-09-04 DIAGNOSIS — M545 Low back pain: Secondary | ICD-10-CM

## 2015-09-14 ENCOUNTER — Ambulatory Visit
Admission: RE | Admit: 2015-09-14 | Discharge: 2015-09-14 | Disposition: A | Discharge status: Home / Self Care | Payer: Medicare Other | Source: Ambulatory Visit | Attending: Neurological Surgery | Admitting: Neurological Surgery

## 2015-09-14 DIAGNOSIS — M545 Low back pain: Secondary | ICD-10-CM

## 2015-09-19 ENCOUNTER — Other Ambulatory Visit: Payer: Self-pay | Admitting: Neurological Surgery

## 2015-10-15 ENCOUNTER — Encounter (HOSPITAL_COMMUNITY): Payer: Self-pay

## 2015-10-15 ENCOUNTER — Ambulatory Visit (HOSPITAL_COMMUNITY)
Admission: RE | Admit: 2015-10-15 | Discharge: 2015-10-15 | Disposition: A | Discharge status: Home / Self Care | Payer: Medicare Other | Source: Ambulatory Visit | Attending: Neurological Surgery | Admitting: Neurological Surgery

## 2015-10-15 ENCOUNTER — Encounter (HOSPITAL_COMMUNITY)
Admission: RE | Admit: 2015-10-15 | Discharge: 2015-10-15 | Disposition: A | Discharge status: Home / Self Care | Payer: Medicare Other | Source: Ambulatory Visit | Attending: Neurological Surgery | Admitting: Neurological Surgery

## 2015-10-15 DIAGNOSIS — M48061 Spinal stenosis, lumbar region without neurogenic claudication: Secondary | ICD-10-CM

## 2015-10-15 DIAGNOSIS — Z01810 Encounter for preprocedural cardiovascular examination: Secondary | ICD-10-CM | POA: Insufficient documentation

## 2015-10-15 DIAGNOSIS — I700 Atherosclerosis of aorta: Secondary | ICD-10-CM | POA: Insufficient documentation

## 2015-10-15 DIAGNOSIS — M4806 Spinal stenosis, lumbar region: Secondary | ICD-10-CM | POA: Insufficient documentation

## 2015-10-15 HISTORY — DX: Presence of spectacles and contact lenses: Z97.3

## 2015-10-15 HISTORY — DX: Presence of dental prosthetic device (complete) (partial): Z97.2

## 2015-10-15 HISTORY — DX: Type 2 diabetes mellitus with diabetic neuropathy, unspecified: E11.40

## 2015-10-15 HISTORY — DX: Hypothyroidism, unspecified: E03.9

## 2015-10-15 HISTORY — DX: Spinal stenosis, lumbar region without neurogenic claudication: M48.061

## 2015-10-15 LAB — BASIC METABOLIC PANEL
ANION GAP: 9 (ref 5–15)
BUN: 25 mg/dL — AB (ref 6–20)
CO2: 23 mmol/L (ref 22–32)
Calcium: 9.7 mg/dL (ref 8.9–10.3)
Chloride: 106 mmol/L (ref 101–111)
Creatinine, Ser: 1.34 mg/dL — ABNORMAL HIGH (ref 0.44–1.00)
GFR calc Af Amer: 42 mL/min — ABNORMAL LOW (ref >60)
GFR calc non Af Amer: 37 mL/min — ABNORMAL LOW (ref >60)
GLUCOSE: 160 mg/dL — AB (ref 65–99)
POTASSIUM: 4.6 mmol/L (ref 3.5–5.1)
Sodium: 138 mmol/L (ref 135–145)

## 2015-10-15 LAB — CBC WITH DIFFERENTIAL/PLATELET
BASOS ABS: 0 10*3/uL (ref 0.0–0.1)
Basophils Relative: 0 %
EOS PCT: 2 %
Eosinophils Absolute: 0.1 10*3/uL (ref 0.0–0.7)
HEMATOCRIT: 44 % (ref 36.0–46.0)
Hemoglobin: 14.3 g/dL (ref 12.0–15.0)
LYMPHS ABS: 3 10*3/uL (ref 0.7–4.0)
LYMPHS PCT: 33 %
MCH: 31.5 pg (ref 26.0–34.0)
MCHC: 32.5 g/dL (ref 30.0–36.0)
MCV: 96.9 fL (ref 78.0–100.0)
MONO ABS: 1 10*3/uL (ref 0.1–1.0)
MONOS PCT: 10 %
NEUTROS ABS: 5.1 10*3/uL (ref 1.7–7.7)
Neutrophils Relative %: 55 %
Platelets: 327 10*3/uL (ref 150–400)
RBC: 4.54 MIL/uL (ref 3.87–5.11)
RDW: 12.4 % (ref 11.5–15.5)
WBC: 9.2 10*3/uL (ref 4.0–10.5)

## 2015-10-15 LAB — SURGICAL PCR SCREEN
MRSA, PCR: NEGATIVE
Staphylococcus aureus: NEGATIVE

## 2015-10-15 LAB — PROTIME-INR
INR: 0.98
Prothrombin Time: 13 seconds (ref 11.4–15.2)

## 2015-10-15 LAB — GLUCOSE, CAPILLARY: Glucose-Capillary: 192 mg/dL — ABNORMAL HIGH (ref 65–99)

## 2015-10-15 NOTE — Pre-Procedure Instructions (Signed)
Laurene Footmaneggy J Rotondo  10/15/2015      [facility] [place] - [id number] AT [facility] [address] [place] [id number] Phone: [phone] Fax: [phone]  [id number] [place], [place] - [address] [place] [id number] Phone: [phone] Fax: [phone]    Your procedure is scheduled on  Wednesday, October 22, 2015  [facility] Ad[facility] at 10:00 A.M.  Call this number if you have problems the morning of surgery:  [phone]   Remember:  Do not eat food or drink liquids after midnight Tuesday, October 21, 2015  Take these medicines the morning of surgery with A SIP OF WATER : Nexium, Gabapentin, Levothyroxine  ( Synthroid) if needed: Tramadol ( Ultram) for pain  Stop taking Aspirin, all  Vitamins, and herbal medications CO Q 10, Probiotic, Biotin, Flaxseed. Do not take any NSAIDs ie: Ibuprofen, Motrin Advil, Naproxen, BC and Goody or any medication containing Aspirin; stop now.    How to Manage Your Diabetes Before and After Surgery  Why is it important to control my blood sugar before and after surgery? . Improving blood sugar levels before and after surgery helps healing and can limit problems. . A way of improving blood sugar control is eating a healthy diet by: o  Eating less sugar and carbohydrates o  Increasing activity/exercise o  Talking with your doctor about reaching your blood sugar goals . High blood sugars (greater than 180 mg/dL) can raise your risk of infections and slow your recovery, so you will need to focus on controlling your diabetes during the weeks before surgery. . Make sure that the doctor who takes care of your diabetes knows about your planned surgery including the date and location.  How do I manage my blood sugar before surgery? . Check your blood sugar at least 4 times a day, starting 2 days before surgery, to make sure that the  level is not too high or low. o Check your blood sugar the morning of your surgery when you wake up and every 2 hours until you get to the Short Stay unit. . If your blood sugar is less than 70 mg/dL, you will need to treat for low blood sugar: o Do not take insulin. o Treat a low blood sugar (less than 70 mg/dL) with  cup of clear juice (cranberry or apple), 4 glucose tablets, OR glucose gel. o Recheck blood sugar in 15 minutes after treatment (to make sure it is greater than 70 mg/dL). If your blood sugar is not greater than 70 mg/dL on recheck, call [phone] for further instructions. . Report your blood sugar to the short stay nurse when you get to Short Stay.  . If you are admitted to the hospital after surgery: o Your blood sugar will be checked by the staff and you will probably be given insulin after surgery (instead of oral diabetes medicines) to make sure you have good blood sugar levels. o The goal for blood sugar control after surgery is 80-180 mg/d     WHAT DO I DO ABOUT MY DIABETES MEDICATION?   Marland Kitchen. Do not take oral diabetes medicines (pills) the morning of surgery such as Metformin ( Glucophage) and Glipizide  ( Glucotrol)  Other Instructions: Do not take Glipizide  (Glucotrol) the night before surgery.   Patient Signature:  Date:   Nurse Signature:  Date:   Reviewed and Endorsed by  [facility] Patient Education Committee, August 2015  Do not wear jewelry, make-up or nail polish.  Do not wear lotions, powders, or perfumes, or deoderant.  Do not shave 48 hours prior to surgery.  Men may shave face and neck.  Do not bring valuables to the hospital.  [facility] is not responsible for any belongings or valuables.  Contacts, dentures or bridgework may not be worn into surgery.  Leave your suitcase in the car.  After surgery it may be brought to your room.  For patients admitted to the hospital, discharge time will be determined by your treatment team.  Patients  discharged the day of surgery will not be allowed to drive home.   Name and phone number of your driver:   Special instructions: Shower the night before surgery and the morning of surgery with CHG  Please read over the following fact sheets that you were given. Pain Booklet, Coughing and Deep Breathing, MRSA Information and Surgical Site Infection Prevention

## 2015-10-15 NOTE — Progress Notes (Signed)
Pt denies SOB, chest pain, and being under the care of a cardiologist. Pt denies having a stress test, echo and cardiac cath. Pt denies having a chest x ray and EKG within the last year. Pt denies having labs within the last 2 weeks but stated that an A1c was recently done at PCP, [facility] The[facility]ore [facility]insSingh, [facility]; records requested. Pt stated that her fasting blood glucose usually ranges between 80'2-120's. Pt chart forwarded to anesthesia to review chest x ray, " calcification in the wall of the the thoracic aorta."

## 2015-10-17 NOTE — Progress Notes (Signed)
Anesthesia Chart Review: Patient is a 81 year old female scheduled for laminectomy and laminotomy L3, L4 on 10/22/15 by Dr. Yetta BarreJones.   History includes former smoker, post-operatively N/V, HTN, DM2 with neuropathy, GERD, hypercholesterolemia, hypothyroidism, left TKA '12, hysterectomy, L2-3 microdiscectomy '13.  PCP is Dr. Leotis ShamesJasmine Singh with [facility] (see Care Everywhere).  Meds include Lotrel, aspirin, Nexium, flaxseed oil, Neurontin, glipizide, levothyroxine, metformin, Crestor, Ultram.  BP 136/68   Pulse 84   Temp 36.6 C   Resp 20   Ht 5\' 2"  (1.575 m)   Wt 155 lb (70.3 kg)   SpO2 96%   BMI 28.35 kg/m   10/15/15 EKG: NSR.  10/15/15 CXR: IMPRESSION: There is no active cardiopulmonary disease. Aortic atherosclerosis.  Preoperative labs noted. BUN 25, Cr 1.34. CBC, PT/INR WNL. Glucose 160. A1c 8.4 on 08/13/15 (Care Everywhere). She reports fasting CBG of 80's-120's.  If no acute changes then I anticipate that she can proceed as planned.  Paula Ochsllison Hagar Sadiq, PA-C [facility] Phone [phone] 10/17/2015 3:05 PM

## 2015-10-22 ENCOUNTER — Ambulatory Visit (HOSPITAL_COMMUNITY): Payer: Medicare Other | Admitting: Vascular Surgery

## 2015-10-22 ENCOUNTER — Encounter (HOSPITAL_COMMUNITY)
Admission: RE | Disposition: A | Discharge status: Home / Self Care | Payer: Self-pay | Source: Ambulatory Visit | Attending: Neurological Surgery

## 2015-10-22 ENCOUNTER — Ambulatory Visit (HOSPITAL_COMMUNITY): Payer: Medicare Other | Admitting: Anesthesiology

## 2015-10-22 ENCOUNTER — Ambulatory Visit (HOSPITAL_COMMUNITY)
Admission: RE | Admit: 2015-10-22 | Discharge: 2015-10-22 | Disposition: A | Discharge status: Home / Self Care | Payer: Medicare Other | Source: Ambulatory Visit | Attending: Neurological Surgery | Admitting: Neurological Surgery

## 2015-10-22 ENCOUNTER — Ambulatory Visit (HOSPITAL_COMMUNITY): Payer: Medicare Other

## 2015-10-22 ENCOUNTER — Encounter (HOSPITAL_COMMUNITY): Payer: Self-pay | Admitting: Neurological Surgery

## 2015-10-22 DIAGNOSIS — M4806 Spinal stenosis, lumbar region: Secondary | ICD-10-CM | POA: Diagnosis present

## 2015-10-22 DIAGNOSIS — Z96652 Presence of left artificial knee joint: Secondary | ICD-10-CM | POA: Insufficient documentation

## 2015-10-22 DIAGNOSIS — E1140 Type 2 diabetes mellitus with diabetic neuropathy, unspecified: Secondary | ICD-10-CM | POA: Insufficient documentation

## 2015-10-22 DIAGNOSIS — Z7982 Long term (current) use of aspirin: Secondary | ICD-10-CM | POA: Insufficient documentation

## 2015-10-22 DIAGNOSIS — Z880 Allergy status to penicillin: Secondary | ICD-10-CM | POA: Insufficient documentation

## 2015-10-22 DIAGNOSIS — Z419 Encounter for procedure for purposes other than remedying health state, unspecified: Secondary | ICD-10-CM

## 2015-10-22 DIAGNOSIS — Z7984 Long term (current) use of oral hypoglycemic drugs: Secondary | ICD-10-CM | POA: Insufficient documentation

## 2015-10-22 DIAGNOSIS — I10 Essential (primary) hypertension: Secondary | ICD-10-CM | POA: Diagnosis not present

## 2015-10-22 DIAGNOSIS — M1990 Unspecified osteoarthritis, unspecified site: Secondary | ICD-10-CM | POA: Insufficient documentation

## 2015-10-22 DIAGNOSIS — E039 Hypothyroidism, unspecified: Secondary | ICD-10-CM | POA: Diagnosis not present

## 2015-10-22 DIAGNOSIS — Z9889 Other specified postprocedural states: Secondary | ICD-10-CM

## 2015-10-22 DIAGNOSIS — K219 Gastro-esophageal reflux disease without esophagitis: Secondary | ICD-10-CM | POA: Insufficient documentation

## 2015-10-22 DIAGNOSIS — E7800 Pure hypercholesterolemia, unspecified: Secondary | ICD-10-CM | POA: Insufficient documentation

## 2015-10-22 DIAGNOSIS — Z87891 Personal history of nicotine dependence: Secondary | ICD-10-CM | POA: Diagnosis not present

## 2015-10-22 HISTORY — PX: LUMBAR LAMINECTOMY/DECOMPRESSION MICRODISCECTOMY: SHX5026

## 2015-10-22 LAB — GLUCOSE, CAPILLARY
GLUCOSE-CAPILLARY: 103 mg/dL — AB (ref 65–99)
Glucose-Capillary: 161 mg/dL — ABNORMAL HIGH (ref 65–99)

## 2015-10-22 SURGERY — LUMBAR LAMINECTOMY/DECOMPRESSION MICRODISCECTOMY 1 LEVEL
Anesthesia: General | Site: Back | Laterality: Left

## 2015-10-22 MED ORDER — MEPERIDINE HCL 25 MG/ML IJ SOLN: 6.2500 mg | INTRAMUSCULAR | Status: DC | PRN

## 2015-10-22 MED ORDER — BUPIVACAINE HCL (PF) 0.25 % IJ SOLN
INTRAMUSCULAR | Status: DC | PRN
  Administered 2015-10-22: 4 mL

## 2015-10-22 MED ORDER — VANCOMYCIN HCL IN DEXTROSE 1-5 GM/200ML-% IV SOLN: 1000.0000 mg | INTRAVENOUS | Status: DC

## 2015-10-22 MED ORDER — FENTANYL CITRATE (PF) 100 MCG/2ML IJ SOLN
INTRAMUSCULAR | Status: AC
  Filled 2015-10-22: qty 2

## 2015-10-22 MED ORDER — SODIUM CHLORIDE 0.9 % IR SOLN
Status: DC | PRN
  Administered 2015-10-22: 500 mL

## 2015-10-22 MED ORDER — 0.9 % SODIUM CHLORIDE (POUR BTL) OPTIME
TOPICAL | Status: DC | PRN
  Administered 2015-10-22: 1000 mL

## 2015-10-22 MED ORDER — ONDANSETRON HCL 4 MG/2ML IJ SOLN
INTRAMUSCULAR | Status: DC | PRN
  Administered 2015-10-22: 4 mg via INTRAVENOUS

## 2015-10-22 MED ORDER — PHENYLEPHRINE HCL 10 MG/ML IJ SOLN
INTRAMUSCULAR | Status: DC | PRN
  Administered 2015-10-22 (×3): 80 ug via INTRAVENOUS

## 2015-10-22 MED ORDER — AMLODIPINE BESY-BENAZEPRIL HCL 10-20 MG PO CAPS: 1.0000 | ORAL_CAPSULE | Freq: Every day | ORAL | Status: DC

## 2015-10-22 MED ORDER — ROCURONIUM BROMIDE 10 MG/ML (PF) SYRINGE
PREFILLED_SYRINGE | INTRAVENOUS | Status: AC
  Filled 2015-10-22: qty 20

## 2015-10-22 MED ORDER — VANCOMYCIN HCL IN DEXTROSE 1-5 GM/200ML-% IV SOLN
1000.0000 mg | Freq: Once | INTRAVENOUS | Status: DC
  Filled 2015-10-22: qty 200

## 2015-10-22 MED ORDER — PROPOFOL 10 MG/ML IV BOLUS
INTRAVENOUS | Status: DC | PRN
  Administered 2015-10-22: 100 mg via INTRAVENOUS

## 2015-10-22 MED ORDER — ACETAMINOPHEN 650 MG RE SUPP: 650.0000 mg | RECTAL | Status: DC | PRN

## 2015-10-22 MED ORDER — ACETAMINOPHEN 325 MG PO TABS: 650.0000 mg | ORAL_TABLET | ORAL | Status: DC | PRN

## 2015-10-22 MED ORDER — VANCOMYCIN HCL IN DEXTROSE 1-5 GM/200ML-% IV SOLN
INTRAVENOUS | Status: AC
  Filled 2015-10-22: qty 200

## 2015-10-22 MED ORDER — SODIUM CHLORIDE 0.9% FLUSH: 3.0000 mL | Freq: Two times a day (BID) | INTRAVENOUS | Status: DC

## 2015-10-22 MED ORDER — CHLORHEXIDINE GLUCONATE CLOTH 2 % EX PADS: 6.0000 | MEDICATED_PAD | Freq: Once | CUTANEOUS | Status: DC

## 2015-10-22 MED ORDER — PHENYLEPHRINE 40 MCG/ML (10ML) SYRINGE FOR IV PUSH (FOR BLOOD PRESSURE SUPPORT)
PREFILLED_SYRINGE | INTRAVENOUS | Status: AC
  Filled 2015-10-22: qty 10

## 2015-10-22 MED ORDER — ONDANSETRON HCL 4 MG/2ML IJ SOLN
INTRAMUSCULAR | Status: AC
  Filled 2015-10-22: qty 2

## 2015-10-22 MED ORDER — METFORMIN HCL ER 500 MG PO TB24: 1000.0000 mg | ORAL_TABLET | Freq: Two times a day (BID) | ORAL | Status: DC

## 2015-10-22 MED ORDER — LEVOTHYROXINE SODIUM 50 MCG PO TABS: 50.0000 ug | ORAL_TABLET | Freq: Every day | ORAL | Status: DC

## 2015-10-22 MED ORDER — PHENYLEPHRINE 40 MCG/ML (10ML) SYRINGE FOR IV PUSH (FOR BLOOD PRESSURE SUPPORT)
PREFILLED_SYRINGE | INTRAVENOUS | Status: AC
  Filled 2015-10-22: qty 30

## 2015-10-22 MED ORDER — THROMBIN 5000 UNITS EX SOLR
CUTANEOUS | Status: DC | PRN
  Administered 2015-10-22 (×2): 5000 [IU] via TOPICAL

## 2015-10-22 MED ORDER — GELATIN ABSORBABLE MT POWD
OROMUCOSAL | Status: DC | PRN
  Administered 2015-10-22: 13:00:00 via TOPICAL

## 2015-10-22 MED ORDER — BENAZEPRIL HCL 20 MG PO TABS
20.0000 mg | ORAL_TABLET | Freq: Every day | ORAL | Status: DC
  Administered 2015-10-22: 20 mg via ORAL
  Filled 2015-10-22: qty 1

## 2015-10-22 MED ORDER — DEXAMETHASONE SODIUM PHOSPHATE 10 MG/ML IJ SOLN
10.0000 mg | INTRAMUSCULAR | Status: AC
  Administered 2015-10-22: 10 mg via INTRAVENOUS
  Filled 2015-10-22: qty 1

## 2015-10-22 MED ORDER — GLIPIZIDE ER 10 MG PO TB24
10.0000 mg | ORAL_TABLET | Freq: Two times a day (BID) | ORAL | Status: DC
  Filled 2015-10-22: qty 1

## 2015-10-22 MED ORDER — PHENOL 1.4 % MT LIQD: 1.0000 | OROMUCOSAL | Status: DC | PRN

## 2015-10-22 MED ORDER — EPHEDRINE 5 MG/ML INJ
INTRAVENOUS | Status: AC
  Filled 2015-10-22: qty 10

## 2015-10-22 MED ORDER — LIDOCAINE HCL (CARDIAC) 20 MG/ML IV SOLN
INTRAVENOUS | Status: DC | PRN
  Administered 2015-10-22: 100 mg via INTRAVENOUS

## 2015-10-22 MED ORDER — TRAMADOL HCL 50 MG PO TABS
50.0000 mg | ORAL_TABLET | Freq: Three times a day (TID) | ORAL | Status: DC | PRN
  Administered 2015-10-22: 50 mg via ORAL
  Filled 2015-10-22: qty 1

## 2015-10-22 MED ORDER — ROCURONIUM BROMIDE 10 MG/ML (PF) SYRINGE
PREFILLED_SYRINGE | INTRAVENOUS | Status: AC
  Filled 2015-10-22: qty 10

## 2015-10-22 MED ORDER — LIDOCAINE 2% (20 MG/ML) 5 ML SYRINGE
INTRAMUSCULAR | Status: AC
  Filled 2015-10-22: qty 5

## 2015-10-22 MED ORDER — GABAPENTIN 300 MG PO CAPS
300.0000 mg | ORAL_CAPSULE | Freq: Three times a day (TID) | ORAL | Status: DC
  Administered 2015-10-22: 300 mg via ORAL
  Filled 2015-10-22: qty 1

## 2015-10-22 MED ORDER — SUCCINYLCHOLINE CHLORIDE 200 MG/10ML IV SOSY
PREFILLED_SYRINGE | INTRAVENOUS | Status: AC
  Filled 2015-10-22: qty 10

## 2015-10-22 MED ORDER — METFORMIN HCL 500 MG PO TABS: 1000.0000 mg | ORAL_TABLET | Freq: Two times a day (BID) | ORAL | Status: DC

## 2015-10-22 MED ORDER — HYDROMORPHONE HCL 1 MG/ML IJ SOLN: 0.5000 mg | INTRAMUSCULAR | Status: DC | PRN

## 2015-10-22 MED ORDER — KETOROLAC TROMETHAMINE 30 MG/ML IJ SOLN
INTRAMUSCULAR | Status: AC
  Filled 2015-10-22: qty 2

## 2015-10-22 MED ORDER — LIDOCAINE 2% (20 MG/ML) 5 ML SYRINGE
INTRAMUSCULAR | Status: AC
  Filled 2015-10-22: qty 15

## 2015-10-22 MED ORDER — MENTHOL 3 MG MT LOZG: 1.0000 | LOZENGE | OROMUCOSAL | Status: DC | PRN

## 2015-10-22 MED ORDER — PROPOFOL 10 MG/ML IV BOLUS
INTRAVENOUS | Status: AC
  Filled 2015-10-22: qty 20

## 2015-10-22 MED ORDER — SODIUM CHLORIDE 0.9% FLUSH: 3.0000 mL | INTRAVENOUS | Status: DC | PRN

## 2015-10-22 MED ORDER — ROCURONIUM BROMIDE 10 MG/ML (PF) SYRINGE
PREFILLED_SYRINGE | INTRAVENOUS | Status: DC | PRN
  Administered 2015-10-22: 50 mg via INTRAVENOUS

## 2015-10-22 MED ORDER — FENTANYL CITRATE (PF) 100 MCG/2ML IJ SOLN
25.0000 ug | INTRAMUSCULAR | Status: DC | PRN
  Administered 2015-10-22 (×2): 50 ug via INTRAVENOUS

## 2015-10-22 MED ORDER — SODIUM CHLORIDE 0.9 % IV SOLN: 250.0000 mL | INTRAVENOUS | Status: DC

## 2015-10-22 MED ORDER — METFORMIN HCL 500 MG PO TABS
1000.0000 mg | ORAL_TABLET | Freq: Two times a day (BID) | ORAL | Status: DC
  Administered 2015-10-22: 1000 mg via ORAL
  Filled 2015-10-22: qty 2

## 2015-10-22 MED ORDER — ONDANSETRON HCL 4 MG/2ML IJ SOLN: 4.0000 mg | INTRAMUSCULAR | Status: DC | PRN

## 2015-10-22 MED ORDER — ONDANSETRON HCL 4 MG/2ML IJ SOLN: 4.0000 mg | Freq: Once | INTRAMUSCULAR | Status: DC | PRN

## 2015-10-22 MED ORDER — TRAMADOL HCL 50 MG PO TABS: 50.0000 mg | ORAL_TABLET | Freq: Four times a day (QID) | ORAL | 1 refills | Status: DC | PRN

## 2015-10-22 MED ORDER — AMLODIPINE BESYLATE 10 MG PO TABS
10.0000 mg | ORAL_TABLET | Freq: Every day | ORAL | Status: DC
  Administered 2015-10-22: 10 mg via ORAL
  Filled 2015-10-22: qty 1

## 2015-10-22 MED ORDER — LACTATED RINGERS IV SOLN
INTRAVENOUS | Status: DC
  Administered 2015-10-22: 10:00:00 via INTRAVENOUS

## 2015-10-22 MED ORDER — BIOTIN 2.5 MG PO TABS: 2.5000 mg | ORAL_TABLET | Freq: Every day | ORAL | Status: DC

## 2015-10-22 MED ORDER — SUGAMMADEX SODIUM 200 MG/2ML IV SOLN
INTRAVENOUS | Status: AC
  Filled 2015-10-22: qty 2

## 2015-10-22 MED ORDER — CEFAZOLIN SODIUM-DEXTROSE 2-3 GM-% IV SOLR
INTRAVENOUS | Status: DC | PRN
  Administered 2015-10-22: 2 g via INTRAVENOUS

## 2015-10-22 MED ORDER — POTASSIUM CHLORIDE IN NACL 20-0.9 MEQ/L-% IV SOLN
INTRAVENOUS | Status: DC
  Filled 2015-10-22: qty 1000

## 2015-10-22 MED ORDER — HEMOSTATIC AGENTS (NO CHARGE) OPTIME
TOPICAL | Status: DC | PRN
  Administered 2015-10-22: 1 via TOPICAL

## 2015-10-22 MED ORDER — FENTANYL CITRATE (PF) 100 MCG/2ML IJ SOLN
INTRAMUSCULAR | Status: DC | PRN
  Administered 2015-10-22 (×2): 50 ug via INTRAVENOUS
  Administered 2015-10-22: 100 ug via INTRAVENOUS

## 2015-10-22 SURGICAL SUPPLY — 38 items
BAG DECANTER FOR FLEXI CONT (MISCELLANEOUS) ×3 IMPLANT
BENZOIN TINCTURE PRP APPL 2/3 (GAUZE/BANDAGES/DRESSINGS) ×3 IMPLANT
BUR MATCHSTICK NEURO 3.0 LAGG (BURR) ×3 IMPLANT
CANISTER SUCT 3000ML PPV (MISCELLANEOUS) ×3 IMPLANT
CLOSURE WOUND 1/2 X4 (GAUZE/BANDAGES/DRESSINGS) ×1
DRAPE LAPAROTOMY 100X72X124 (DRAPES) ×3 IMPLANT
DRAPE MICROSCOPE LEICA (MISCELLANEOUS) ×3 IMPLANT
DRAPE POUCH INSTRU U-SHP 10X18 (DRAPES) ×3 IMPLANT
DRAPE SURG 17X23 STRL (DRAPES) ×3 IMPLANT
DRSG OPSITE 4X5.5 SM (GAUZE/BANDAGES/DRESSINGS) ×3 IMPLANT
DURAPREP 26ML APPLICATOR (WOUND CARE) ×3 IMPLANT
ELECT REM PT RETURN 9FT ADLT (ELECTROSURGICAL) ×3
ELECTRODE REM PT RTRN 9FT ADLT (ELECTROSURGICAL) ×1 IMPLANT
GAUZE SPONGE 4X4 16PLY XRAY LF (GAUZE/BANDAGES/DRESSINGS) IMPLANT
GLOVE BIO SURGEON STRL SZ8 (GLOVE) ×3 IMPLANT
GOWN STRL REUS W/ TWL LRG LVL3 (GOWN DISPOSABLE) IMPLANT
GOWN STRL REUS W/ TWL XL LVL3 (GOWN DISPOSABLE) ×1 IMPLANT
GOWN STRL REUS W/TWL 2XL LVL3 (GOWN DISPOSABLE) IMPLANT
GOWN STRL REUS W/TWL LRG LVL3 (GOWN DISPOSABLE)
GOWN STRL REUS W/TWL XL LVL3 (GOWN DISPOSABLE) ×2
HEMOSTAT POWDER KIT SURGIFOAM (HEMOSTASIS) IMPLANT
KIT BASIN OR (CUSTOM PROCEDURE TRAY) ×3 IMPLANT
KIT ROOM TURNOVER OR (KITS) ×3 IMPLANT
NEEDLE HYPO 25X1 1.5 SAFETY (NEEDLE) ×3 IMPLANT
NEEDLE SPNL 20GX3.5 QUINCKE YW (NEEDLE) IMPLANT
NS IRRIG 1000ML POUR BTL (IV SOLUTION) ×3 IMPLANT
PACK LAMINECTOMY NEURO (CUSTOM PROCEDURE TRAY) ×3 IMPLANT
PAD ARMBOARD 7.5X6 YLW CONV (MISCELLANEOUS) ×9 IMPLANT
RUBBERBAND STERILE (MISCELLANEOUS) ×6 IMPLANT
SPONGE SURGIFOAM ABS GEL SZ50 (HEMOSTASIS) ×3 IMPLANT
STRIP CLOSURE SKIN 1/2X4 (GAUZE/BANDAGES/DRESSINGS) ×2 IMPLANT
SUT VIC AB 0 CT1 18XCR BRD8 (SUTURE) ×1 IMPLANT
SUT VIC AB 0 CT1 8-18 (SUTURE) ×2
SUT VIC AB 2-0 CP2 18 (SUTURE) ×3 IMPLANT
SUT VIC AB 3-0 SH 8-18 (SUTURE) ×3 IMPLANT
TOWEL OR 17X24 6PK STRL BLUE (TOWEL DISPOSABLE) ×3 IMPLANT
TOWEL OR 17X26 10 PK STRL BLUE (TOWEL DISPOSABLE) ×3 IMPLANT
WATER STERILE IRR 1000ML POUR (IV SOLUTION) ×3 IMPLANT

## 2015-10-22 NOTE — Anesthesia Procedure Notes (Signed)
Procedure Name: Intubation Date/Time: 10/22/2015 12:15 PM Performed by: Glo HerringLEE, Aldonia Keeven B Pre-anesthesia Checklist: Patient identified, Emergency Drugs available, Suction available, Patient being monitored and Timeout performed Patient Re-evaluated:Patient Re-evaluated prior to inductionOxygen Delivery Method: Circle system utilized Preoxygenation: Pre-oxygenation with 100% oxygen Intubation Type: IV induction Ventilation: Mask ventilation without difficulty Laryngoscope Size: Mac and 3 Grade View: Grade I Tube type: Oral Tube size: 7.0 mm Number of attempts: 1 Airway Equipment and Method: Stylet Placement Confirmation: CO2 detector,  breath sounds checked- equal and bilateral,  positive ETCO2 and ETT inserted through vocal cords under direct vision Secured at: 21 cm Tube secured with: Tape Dental Injury: Teeth and Oropharynx as per pre-operative assessment

## 2015-10-22 NOTE — Progress Notes (Signed)
Pt being discharged from hospital per orders from MD. Pt educated on discharge instructions. Pt verbalized understanding of instructions. All questions and concerns were addressed. Pt's IV was removed prior to discharge.

## 2015-10-22 NOTE — Anesthesia Preprocedure Evaluation (Signed)
Anesthesia Evaluation  Patient identified by MRN, date of birth, ID band Patient awake    Reviewed: Allergy & Precautions, H&P , NPO status , Patient's Chart, lab work & pertinent test results  History of Anesthesia Complications (+) PONV and history of anesthetic complications  Airway Mallampati: I  TM Distance: >3 FB Neck ROM: Full    Dental  (+) Teeth Intact, Caps, Missing, Dental Advisory Given   Pulmonary former smoker,    Pulmonary exam normal breath sounds clear to auscultation       Cardiovascular hypertension, Pt. on medications Normal cardiovascular exam Rhythm:Regular Rate:Normal     Neuro/Psych negative neurological ROS     GI/Hepatic Neg liver ROS, GERD  Medicated and Controlled,  Endo/Other  diabetes, Type 2, Oral Hypoglycemic Agents  Renal/GU negative Renal ROS     Musculoskeletal   Abdominal   Peds  Hematology   Anesthesia Other Findings   Reproductive/Obstetrics                             Anesthesia Physical  Anesthesia Plan  ASA: III  Anesthesia Plan: General   Post-op Pain Management:    Induction: Intravenous  Airway Management Planned: Oral ETT  Additional Equipment:   Intra-op Plan:   Post-operative Plan:   Informed Consent: I have reviewed the patients History and Physical, chart, labs and discussed the procedure including the risks, benefits and alternatives for the proposed anesthesia with the patient or authorized representative who has indicated his/her understanding and acceptance.   Dental advisory given  Plan Discussed with: CRNA, Anesthesiologist and Surgeon  Anesthesia Plan Comments:                                          Anesthesia Evaluation  Patient identified by MRN, date of birth, ID band Patient awake    Reviewed: Allergy & Precautions, H&P , NPO status , Patient's Chart, lab work & pertinent test results  History of  Anesthesia Complications (+) PONV  Airway Mallampati: I TM Distance: >3 FB Neck ROM: Full    Dental  (+) Teeth Intact, Caps, Missing and Dental Advisory Given   Pulmonary  breath sounds clear to auscultation  Pulmonary exam normal       Cardiovascular hypertension, Pt. on medications Rhythm:Regular Rate:Normal     Neuro/Psych negative neurological ROS     GI/Hepatic Neg liver ROS, GERD-  Medicated and Controlled,  Endo/Other  Diabetes mellitus-, Type 2, Oral Hypoglycemic Agents  Renal/GU negative Renal ROS     Musculoskeletal   Abdominal   Peds  Hematology   Anesthesia Other Findings   Reproductive/Obstetrics                           Anesthesia Physical Anesthesia Plan  ASA: III  Anesthesia Plan: General   Post-op Pain Management:    Induction: Intravenous  Airway Management Planned: Oral ETT  Additional Equipment:   Intra-op Plan:   Post-operative Plan:   Informed Consent: I have reviewed the patients History and Physical, chart, labs and discussed the procedure including the risks, benefits and alternatives for the proposed anesthesia with the patient or authorized representative who has indicated his/her understanding and acceptance.   Dental advisory given  Plan Discussed with: CRNA, Anesthesiologist and Surgeon  Anesthesia Plan Comments:  Anesthesia Quick Evaluation  Anesthesia Quick Evaluation

## 2015-10-22 NOTE — Op Note (Signed)
10/22/2015  1:13 PM  PATIENT:  Paula Hansen  81 y.o. female  PRE-OPERATIVE DIAGNOSIS:  Lumbar spinal stenosis L3-4  POST-OPERATIVE DIAGNOSIS:  same  PROCEDURE:  Decompressive lumbar hemilaminectomy, medial facetectomy and foraminotomy L3-4 on the left with sublaminar decompression  SURGEON:  Marikay Alaravid Stella Bortle, MD  ASSISTANTS: Dr. Lovell SheehanJenkins  ANESTHESIA:   General  EBL: 50 ml  Total I/O In: 800 [I.V.:800] Out: 50 [Blood:50]  BLOOD ADMINISTERED:none  DRAINS: None   SPECIMEN:  No Specimen  INDICATION FOR PROCEDURE: This patient presented with back and left leg pain in L4 distribution. MRI showed spinal stenosis L3-4. She tried medical management without relief. Recommended decompressive laminectomy L3-4. Patient understood the risks, benefits, and alternatives and potential outcomes and wished to proceed.  PROCEDURE DETAILS: The patient was taken to the operating room and after induction of adequate generalized endotracheal anesthesia, the patient was rolled into the prone position on the Wilson frame and all pressure points were padded. The lumbar region was cleaned and then prepped with DuraPrep and draped in the usual sterile fashion. 5 cc of local anesthesia was injected and then a dorsal midline incision was made and carried down to the lumbo sacral fascia. The fascia was opened and the paraspinous musculature was taken down in a subperiosteal fashion to expose L3-4 on the left. Intraoperative x-ray confirmed my level, and then I used a combination of the high-speed drill and the Kerrison punches to perform a hemilaminectomy, medial facetectomy, and foraminotomy at L3-4 on the left. The underlying yellow ligament was opened and removed in a piecemeal fashion to expose the underlying dura and exiting nerve root. I undercut the lateral recess and dissected down until I was medial to and distal to the pedicle. The nerve root was well decompressed. We then gently retracted the nerve root  medially with a retractor, coagulated the epidural venous vasculature, and inspected the disc space. I found no disc herniation. I drilled up under the spinous process and performed a sublaminar decompression with the 3 mm Kerrison punch. I then palpated with a coronary dilator along the nerve root and into the foramen to assure adequate decompression. I felt no more compression of the nerve root. I irrigated with saline solution containing bacitracin. Achieved hemostasis with bipolar cautery, lined the dura with Gelfoam, and then closed the fascia with 0 Vicryl. I closed the subcutaneous tissues with 2-0 Vicryl and the subcuticular tissues with 3-0 Vicryl. The skin was then closed with benzoin and Steri-Strips. The drapes were removed, a sterile dressing was applied. The patient was awakened from general anesthesia and transferred to the recovery room in stable condition. At the end of the procedure all sponge, needle and instrument counts were correct.   PLAN OF CARE: Admit for overnight observation  PATIENT DISPOSITION:  PACU - hemodynamically stable.   Delay start of Pharmacological VTE agent (>24hrs) due to surgical blood loss or risk of bleeding:  yes

## 2015-10-22 NOTE — Progress Notes (Signed)
Post-op vancomycin IV 1000 mg dose x 1, no drain Pharmacy will sign off   Thank you for allowing us to participate in this patients care. Signe Coltonya C Shuntia Exton, PharmD Pager: [phone]

## 2015-10-22 NOTE — Discharge Summary (Signed)
Physician Discharge Summary  Patient ID: Paula Hansen MRN: [id number] DOB/AGE: 04-02-1934 81 y.o.  Admit date: 10/22/2015 Discharge date: 10/22/2015  Admission Diagnoses: lumbar stenosis   Discharge Diagnoses: same   Discharged Condition: good  Hospital Course: The patient was admitted on 10/22/2015 and taken to the operating room where the patient underwent DLL L3-4. The patient tolerated the procedure well and was taken to the recovery room and then to the floor in stable condition. The hospital course was routine. There were no complications. The wound remained clean dry and intact. Pt had appropriate back soreness. No complaints of leg pain or new N/T/W. The patient remained afebrile with stable vital signs, and tolerated a regular diet. The patient continued to increase activities, and pain was well controlled with oral pain medications.   Consults: None  Significant Diagnostic Studies:  Results for orders placed or performed during the hospital encounter of 10/22/15  Glucose, capillary  Result Value Ref Range   Glucose-Capillary 161 (H) 65 - 99 mg/dL   Comment 1 Notify RN    Comment 2 Document in Chart   Glucose, capillary  Result Value Ref Range   Glucose-Capillary 103 (H) 65 - 99 mg/dL    Chest 2 View  Result Date: 10/15/2015 CLINICAL DATA:  Preoperative examination prior to laminectomy. No cardiopulmonary symptoms, former smoker, history of hypertension and hyperlipidemia. EXAM: CHEST  2 VIEW COMPARISON:  PA and lateral chest x-ray of August 16, 2011. FINDINGS: The lungs are adequately inflated and clear. The heart and pulmonary vascularity are normal. There is calcification in the wall of the thoracic aorta. The mediastinum is normal in width. There is mild multilevel degenerative disc disease of the thoracic spine. IMPRESSION: There is no active cardiopulmonary disease. Aortic atherosclerosis. Electronically Signed   By: Elana Jian  [place] M.D.   On: 10/15/2015 15:04     Antibiotics:  Anti-infectives    Start     Dose/Rate Route Frequency Ordered Stop   10/22/15 1327  bacitracin 50,000 Units in sodium chloride irrigation 0.9 % 500 mL irrigation  Status:  Discontinued       As needed 10/22/15 1327 10/22/15 1329   10/22/15 1145  vancomycin (VANCOCIN) IVPB 1000 mg/200 mL premix  Status:  Discontinued     1,000 mg 200 mL/hr over 60 Minutes Intravenous On call to O.R. 10/22/15 0951 10/22/15 1528   10/22/15 0953  vancomycin (VANCOCIN) 1-5 GM/200ML-% IVPB    Comments:  Domenica Fail   : cabinet override      10/22/15 0953 10/22/15 2159      Discharge Exam: Blood pressure (!) 156/70, pulse 81, temperature 98.2 F (36.8 C), temperature source Oral, resp. rate 20, height 5\' 2"  (1.575 m), weight 70.3 kg (155 lb), SpO2 96 %. Neurologic: Grossly normal Dressing dry  Discharge Medications:     Medication List    TAKE these medications   ALIGN PO Take by mouth.   amLODipine-benazepril 10-20 MG capsule Commonly known as:  LOTREL Take 1 capsule by mouth daily.   aspirin EC 81 MG tablet Take 81 mg by mouth every other day.   Biotin 2.5 MG Tabs Take 2,500 mcg by mouth daily.   CALTRATE PLUS PO Take 1 tablet by mouth daily.   cetirizine 10 MG tablet Commonly known as:  ZYRTEC Take 10 mg by mouth daily.   Coenzyme Q10 10 MG capsule Take 10 mg by mouth daily.   esomeprazole 40 MG capsule Commonly known as:  NEXIUM Take 40 mg by mouth  daily before breakfast.   FLAXSEED (LINSEED) PO Take 1 capsule by mouth daily.   Flaxseed Oil 1000 MG Caps Take 1,000 mg by mouth daily.   gabapentin 300 MG capsule Commonly known as:  NEURONTIN Take 300 mg by mouth 3 (three) times daily.   glipiZIDE 10 MG 24 hr tablet Commonly known as:  GLUCOTROL XL Take 10 mg by mouth 2 (two) times daily.   levothyroxine 50 MCG tablet Commonly known as:  SYNTHROID, LEVOTHROID Take 50 mcg by mouth daily before breakfast.   metFORMIN 500 MG tablet Commonly known  as:  GLUCOPHAGE Take 1,000 mg by mouth 2 (two) times daily with a meal.   metFORMIN 500 MG 24 hr tablet Commonly known as:  GLUCOPHAGE-XR Take 1,000 mg by mouth 2 (two) times daily.   multivitamin with minerals Tabs tablet Take 1 tablet by mouth daily.   naproxen sodium 220 MG tablet Commonly known as:  ANAPROX Take 220 mg by mouth 2 (two) times daily as needed (for pain).   rosuvastatin 10 MG tablet Commonly known as:  CRESTOR Take 10 mg by mouth every other day.   traMADol 50 MG tablet Commonly known as:  ULTRAM Take 1 tablet (50 mg total) by mouth every 6 (six) hours as needed for moderate pain. What changed:  when to take this   VITAMIN D-1000 MAX ST 1000 units tablet Generic drug:  Cholecalciferol Take 1,000 Units by mouth daily.   vitamin E 400 UNIT capsule Take 400 Units by mouth daily.       Disposition: home   Final Dx: lumbar lam L3-4  Discharge Instructions     Remove dressing in 72 hours    Complete by:  As directed   Call MD for:  difficulty breathing, headache or visual disturbances    Complete by:  As directed   Call MD for:  persistant nausea and vomiting    Complete by:  As directed   Call MD for:  redness, tenderness, or signs of infection (pain, swelling, redness, odor or green/yellow discharge around incision site)    Complete by:  As directed   Call MD for:  severe uncontrolled pain    Complete by:  As directed   Call MD for:  temperature >100.4    Complete by:  As directed   Diet - low sodium heart healthy    Complete by:  As directed   Increase activity slowly    Complete by:  As directed         Signed: Jaianna Nicoll S 10/22/2015, 4:17 PM

## 2015-10-22 NOTE — H&P (Signed)
Subjective: Patient is a 81 y.o. female admitted for lumbar stenosis. Onset of symptoms was a few months ago, gradually worsening since that time.  The pain is rated severe, and is located at the across the lower back and radiates to leg. The pain is described as aching and occurs all day. The symptoms have been progressive. Symptoms are exacerbated by exercise. MRI or CT showed stenosis   Past Medical History:  Diagnosis Date  . Arthritis   . Diabetes mellitus   . Diabetic neuropathy (HCC)   . GERD (gastroesophageal reflux disease)   . Hypercholesteremia   . Hypertension   . Hypothyroidism   . Lumbar stenosis    L3-L4 left  . PONV (postoperative nausea and vomiting)   . Wears glasses   . Wears partial dentures    lower    Past Surgical History:  Procedure Laterality Date  . ABDOMINAL HYSTERECTOMY  1978  . COLONOSCOPY    . COLONOSCOPY W/ POLYPECTOMY    . DILATION AND CURETTAGE OF UTERUS    . JOINT REPLACEMENT     left knee October 2012.  Marland Kitchen KNEE ARTHROSCOPY W/ MENISCAL REPAIR     left knee  . LUMBAR LAMINECTOMY/DECOMPRESSION MICRODISCECTOMY  08/17/2011   Procedure: LUMBAR LAMINECTOMY/DECOMPRESSION MICRODISCECTOMY 1 LEVEL;  Surgeon: Clydene Fake, MD;  Location: [facility];  Service: Neurosurgery;  Laterality: Right;  Right Lumbar Two-Three Laminectomy/Diskectomy  . TOOTH EXTRACTION      Prior to Admission medications   Medication Sig Start Date End Date Taking? Authorizing Provider  amLODipine-benazepril (LOTREL) 10-20 MG per capsule Take 1 capsule by mouth daily.   Yes Historical Provider, MD  aspirin EC 81 MG tablet Take 81 mg by mouth every other day.    Yes Historical Provider, MD  Biotin 2.5 MG TABS Take 2,500 mcg by mouth daily.    Yes Historical Provider, MD  Calcium Carbonate-Vit D-Min (CALTRATE PLUS PO) Take 1 tablet by mouth daily.   Yes Historical Provider, MD  cetirizine (ZYRTEC) 10 MG tablet Take 10 mg by mouth daily.   Yes Historical Provider, MD   Cholecalciferol (VITAMIN D-1000 MAX ST) 1000 units tablet Take 1,000 Units by mouth daily.    Yes Historical Provider, MD  Coenzyme Q10 10 MG capsule Take 10 mg by mouth daily.   Yes Historical Provider, MD  esomeprazole (NEXIUM) 40 MG capsule Take 40 mg by mouth daily before breakfast.   Yes Historical Provider, MD  Flaxseed, Linseed, (FLAXSEED OIL) 1000 MG CAPS Take 1,000 mg by mouth daily.   Yes Historical Provider, MD  FLAXSEED, LINSEED, PO Take 1 capsule by mouth daily.   Yes Historical Provider, MD  gabapentin (NEURONTIN) 300 MG capsule Take 300 mg by mouth 3 (three) times daily.   Yes Historical Provider, MD  glipiZIDE (GLUCOTROL XL) 10 MG 24 hr tablet Take 10 mg by mouth 2 (two) times daily.  08/02/15  Yes Historical Provider, MD  levothyroxine (SYNTHROID, LEVOTHROID) 50 MCG tablet Take 50 mcg by mouth daily before breakfast.  08/19/15  Yes Historical Provider, MD  metFORMIN (GLUCOPHAGE) 500 MG tablet Take 1,000 mg by mouth 2 (two) times daily with a meal.    Yes Historical Provider, MD  metFORMIN (GLUCOPHAGE-XR) 500 MG 24 hr tablet Take 1,000 mg by mouth 2 (two) times daily. 05/12/15 05/11/16 Yes Historical Provider, MD  Multiple Vitamin (MULTIVITAMIN WITH MINERALS) TABS Take 1 tablet by mouth daily.   Yes Historical Provider, MD  naproxen sodium (ANAPROX) 220 MG tablet Take 220  mg by mouth 2 (two) times daily as needed (for pain).    Yes Historical Provider, MD  Probiotic Product (ALIGN PO) Take by mouth.   Yes Historical Provider, MD  rosuvastatin (CRESTOR) 10 MG tablet Take 10 mg by mouth every other day.   Yes Historical Provider, MD  traMADol (ULTRAM) 50 MG tablet Take 50 mg by mouth 3 (three) times daily as needed for moderate pain.  09/19/15  Yes Historical Provider, MD  vitamin E 400 UNIT capsule Take 400 Units by mouth daily.   Yes Historical Provider, MD   Allergies  Allergen Reactions  . Nickel Other (See Comments)    BREAKING OUT IN MOUTH IRRITATION RINGING IN THE EARS  .  Atorvastatin Other (See Comments)    Muscle Pain  . Ezetimibe-Simvastatin Other (See Comments)    MYALGIAS  . Pravastatin Other (See Comments)    MYALGIAS  . Simvastatin Other (See Comments)    MYALGIAS  . Amoxicillin Other (See Comments)    UNSPECIFIED REACTION   . Codeine Other (See Comments)    Chest pain  . Morphine And Related Nausea And Vomiting  . Oxycodone Nausea And Vomiting  . Shellfish Allergy Nausea And Vomiting  . Sulfa Antibiotics Nausea Only and Other (See Comments)    Upset stomach with burning     Social History  Substance Use Topics  . Smoking status: Former Smoker    Years: 30.00    Types: Cigarettes  . Smokeless tobacco: Never Used     Comment: quit  in 2000-2001  . Alcohol use No    Family History  Problem Relation Age of Onset  . Breast cancer Maternal Aunt     50's  . Stroke Mother   . Stroke Father   . Cancer - Other Brother      Review of Systems  Positive ROS: neg  All other systems have been reviewed and were otherwise negative with the exception of those mentioned in the HPI and as above.  Objective: Vital signs in last 24 hours:    General Appearance: Alert, cooperative, no distress, appears stated age Head: Normocephalic, without obvious abnormality, atraumatic Eyes: PERRL, conjunctiva/corneas clear, EOM's intact    Neck: Supple, symmetrical, trachea midline Back: Symmetric, no curvature, ROM normal, no CVA tenderness Lungs:  respirations unlabored Heart: Regular rate and rhythm Abdomen: Soft, non-tender Extremities: Extremities normal, atraumatic, no cyanosis or edema Pulses: 2+ and symmetric all extremities Skin: Skin color, texture, turgor normal, no rashes or lesions  NEUROLOGIC:   Mental status: Alert and oriented x4,  no aphasia, good attention span, fund of knowledge, and memory Motor Exam - grossly normal Sensory Exam - grossly normal Reflexes: 1= Coordination - grossly normal Gait - grossly normal Balance -  grossly normal Cranial Nerves: I: smell Not tested  II: visual acuity  OS: nl    OD: nl  II: visual fields Full to confrontation  II: pupils Equal, round, reactive to light  III,VII: ptosis None  III,IV,VI: extraocular muscles  Full ROM  V: mastication Normal  V: facial light touch sensation  Normal  V,VII: corneal reflex  Present  VII: facial muscle function - upper  Normal  VII: facial muscle function - lower Normal  VIII: hearing Not tested  IX: soft palate elevation  Normal  IX,X: gag reflex Present  XI: trapezius strength  5/5  XI: sternocleidomastoid strength 5/5  XI: neck flexion strength  5/5  XII: tongue strength  Normal    Data Review Lab  Results  Component Value Date   WBC 9.2 10/15/2015   HGB 14.3 10/15/2015   HCT 44.0 10/15/2015   MCV 96.9 10/15/2015   PLT 327 10/15/2015   Lab Results  Component Value Date   NA 138 10/15/2015   K 4.6 10/15/2015   CL 106 10/15/2015   CO2 23 10/15/2015   BUN 25 (H) 10/15/2015   CREATININE 1.34 (H) 10/15/2015   GLUCOSE 160 (H) 10/15/2015   Lab Results  Component Value Date   INR 0.98 10/15/2015    Assessment/Plan: Patient admitted for LL L3-4 for stenosis. Patient has failed a reasonable attempt at conservative therapy.  I explained the condition and procedure to the patient and answered any questions.  Patient wishes to proceed with procedure as planned. Understands risks/ benefits and typical outcomes of procedure.   Paula Hansen S 10/22/2015 9:50 AM

## 2015-10-22 NOTE — Anesthesia Postprocedure Evaluation (Signed)
Anesthesia Post Note  Patient: Paula Hansen  Procedure(s) Performed: Procedure(s) (LRB): Laminectomy and Foraminotomy - Lumbar three-lumbar four, left (Left)  Patient location during evaluation: PACU Anesthesia Type: General Level of consciousness: awake and alert Pain management: pain level controlled Vital Signs Assessment: post-procedure vital signs reviewed and stable Respiratory status: spontaneous breathing, nonlabored ventilation, respiratory function stable and patient connected to nasal cannula oxygen Cardiovascular status: blood pressure returned to baseline and stable Postop Assessment: no signs of nausea or vomiting Anesthetic complications: no    Last Vitals:  Vitals:   10/22/15 1500 10/22/15 1537  BP: 137/66 (!) 156/70  Pulse: 86 81  Resp: (!) 22 20  Temp:  36.8 C    Last Pain:  Vitals:   10/22/15 1537  TempSrc: Oral  PainSc:                  Jiles GarterJACKSON,[name] EDWARD

## 2015-10-22 NOTE — Transfer of Care (Signed)
Immediate Anesthesia Transfer of Care Note  Patient: Paula Hansen  Procedure(s) Performed: Procedure(s): Laminectomy and Foraminotomy - Lumbar three-lumbar four, left (Left)  Patient Location: PACU  Anesthesia Type:General  Level of Consciousness: awake, alert  and oriented  Airway & Oxygen Therapy: Patient Spontanous Breathing and Patient connected to nasal cannula oxygen  Post-op Assessment: Report given to RN and Post -op Vital signs reviewed and stable  Post vital signs: Reviewed and stable  Last Vitals:  Vitals:   10/22/15 1006  BP: (!) 148/75  Pulse: 85  Resp: 20  Temp: 36.6 C    Last Pain:  Vitals:   10/22/15 1006  TempSrc: Oral      Patients Stated Pain Goal: 0 (10/22/15 1012)  Complications: No apparent anesthesia complications

## 2015-10-23 ENCOUNTER — Encounter (HOSPITAL_COMMUNITY): Payer: Self-pay | Admitting: Neurological Surgery

## 2015-11-13 ENCOUNTER — Other Ambulatory Visit: Payer: Self-pay | Admitting: Internal Medicine

## 2015-11-13 DIAGNOSIS — Z1231 Encounter for screening mammogram for malignant neoplasm of breast: Secondary | ICD-10-CM

## 2015-12-01 ENCOUNTER — Ambulatory Visit
Admission: RE | Admit: 2015-12-01 | Discharge: 2015-12-01 | Disposition: A | Discharge status: Home / Self Care | Payer: Medicare Other | Source: Ambulatory Visit | Attending: Internal Medicine | Admitting: Internal Medicine

## 2015-12-01 DIAGNOSIS — Z1231 Encounter for screening mammogram for malignant neoplasm of breast: Secondary | ICD-10-CM | POA: Insufficient documentation

## 2016-08-12 ENCOUNTER — Ambulatory Visit: Payer: Medicare Other | Admitting: Family

## 2016-10-21 ENCOUNTER — Other Ambulatory Visit: Payer: Self-pay | Admitting: Internal Medicine

## 2016-10-21 DIAGNOSIS — Z1231 Encounter for screening mammogram for malignant neoplasm of breast: Secondary | ICD-10-CM

## 2016-12-06 ENCOUNTER — Ambulatory Visit
Admission: RE | Admit: 2016-12-06 | Discharge: 2016-12-06 | Disposition: A | Discharge status: Home / Self Care | Payer: Medicare Other | Source: Ambulatory Visit | Attending: Internal Medicine | Admitting: Internal Medicine

## 2016-12-06 DIAGNOSIS — Z1231 Encounter for screening mammogram for malignant neoplasm of breast: Secondary | ICD-10-CM | POA: Diagnosis not present

## 2017-10-31 ENCOUNTER — Other Ambulatory Visit: Payer: Self-pay | Admitting: Internal Medicine

## 2017-10-31 DIAGNOSIS — Z1231 Encounter for screening mammogram for malignant neoplasm of breast: Secondary | ICD-10-CM

## 2017-12-08 ENCOUNTER — Ambulatory Visit
Admission: RE | Admit: 2017-12-08 | Discharge: 2017-12-08 | Disposition: A | Discharge status: Home / Self Care | Payer: Medicare Other | Source: Ambulatory Visit | Attending: Internal Medicine | Admitting: Internal Medicine

## 2017-12-08 DIAGNOSIS — Z1231 Encounter for screening mammogram for malignant neoplasm of breast: Secondary | ICD-10-CM | POA: Insufficient documentation

## 2017-12-22 DIAGNOSIS — N183 Chronic kidney disease, stage 3 unspecified: Secondary | ICD-10-CM | POA: Insufficient documentation

## 2018-07-19 ENCOUNTER — Emergency Department
Admission: EM | Admit: 2018-07-19 | Discharge: 2018-07-19 | Disposition: A | Discharge status: Other Acute Inpatient Hospital | Payer: Medicare Other | Attending: Emergency Medicine | Admitting: Emergency Medicine

## 2018-07-19 ENCOUNTER — Emergency Department: Payer: Medicare Other

## 2018-07-19 ENCOUNTER — Encounter: Payer: Self-pay | Admitting: Emergency Medicine

## 2018-07-19 DIAGNOSIS — E1140 Type 2 diabetes mellitus with diabetic neuropathy, unspecified: Secondary | ICD-10-CM | POA: Diagnosis not present

## 2018-07-19 DIAGNOSIS — Y92009 Unspecified place in unspecified non-institutional (private) residence as the place of occurrence of the external cause: Secondary | ICD-10-CM | POA: Insufficient documentation

## 2018-07-19 DIAGNOSIS — S06341A Traumatic hemorrhage of right cerebrum with loss of consciousness of 30 minutes or less, initial encounter: Secondary | ICD-10-CM | POA: Diagnosis not present

## 2018-07-19 DIAGNOSIS — Z7982 Long term (current) use of aspirin: Secondary | ICD-10-CM | POA: Insufficient documentation

## 2018-07-19 DIAGNOSIS — S0990XA Unspecified injury of head, initial encounter: Secondary | ICD-10-CM | POA: Diagnosis present

## 2018-07-19 DIAGNOSIS — Y9301 Activity, walking, marching and hiking: Secondary | ICD-10-CM | POA: Insufficient documentation

## 2018-07-19 DIAGNOSIS — W0110XA Fall on same level from slipping, tripping and stumbling with subsequent striking against unspecified object, initial encounter: Secondary | ICD-10-CM | POA: Diagnosis not present

## 2018-07-19 DIAGNOSIS — Z79899 Other long term (current) drug therapy: Secondary | ICD-10-CM | POA: Diagnosis not present

## 2018-07-19 DIAGNOSIS — Z87891 Personal history of nicotine dependence: Secondary | ICD-10-CM | POA: Insufficient documentation

## 2018-07-19 DIAGNOSIS — Z20828 Contact with and (suspected) exposure to other viral communicable diseases: Secondary | ICD-10-CM | POA: Diagnosis not present

## 2018-07-19 DIAGNOSIS — W19XXXA Unspecified fall, initial encounter: Secondary | ICD-10-CM

## 2018-07-19 DIAGNOSIS — E039 Hypothyroidism, unspecified: Secondary | ICD-10-CM | POA: Insufficient documentation

## 2018-07-19 DIAGNOSIS — I10 Essential (primary) hypertension: Secondary | ICD-10-CM | POA: Insufficient documentation

## 2018-07-19 DIAGNOSIS — Z7984 Long term (current) use of oral hypoglycemic drugs: Secondary | ICD-10-CM | POA: Diagnosis not present

## 2018-07-19 DIAGNOSIS — Y999 Unspecified external cause status: Secondary | ICD-10-CM | POA: Diagnosis not present

## 2018-07-19 LAB — LIPASE, BLOOD: Lipase: 22 U/L (ref 11–51)

## 2018-07-19 LAB — BASIC METABOLIC PANEL
Anion gap: 13 (ref 5–15)
BUN: 34 mg/dL — ABNORMAL HIGH (ref 8–23)
CO2: 24 mmol/L (ref 22–32)
Calcium: 10.1 mg/dL (ref 8.9–10.3)
Chloride: 101 mmol/L (ref 98–111)
Creatinine, Ser: 1.04 mg/dL — ABNORMAL HIGH (ref 0.44–1.00)
GFR calc Af Amer: 58 mL/min — ABNORMAL LOW (ref >60)
GFR calc non Af Amer: 50 mL/min — ABNORMAL LOW (ref >60)
Glucose, Bld: 264 mg/dL — ABNORMAL HIGH (ref 70–99)
Potassium: 4.2 mmol/L (ref 3.5–5.1)
Sodium: 138 mmol/L (ref 135–145)

## 2018-07-19 LAB — LACTIC ACID, PLASMA: Lactic Acid, Venous: 1.5 mmol/L (ref 0.5–1.9)

## 2018-07-19 LAB — CBC
HCT: 43 % (ref 36.0–46.0)
Hemoglobin: 14.5 g/dL (ref 12.0–15.0)
MCH: 31.8 pg (ref 26.0–34.0)
MCHC: 33.7 g/dL (ref 30.0–36.0)
MCV: 94.3 fL (ref 80.0–100.0)
Platelets: 384 10*3/uL (ref 150–400)
RBC: 4.56 MIL/uL (ref 3.87–5.11)
RDW: 12.3 % (ref 11.5–15.5)
WBC: 18.4 10*3/uL — ABNORMAL HIGH (ref 4.0–10.5)
nRBC: 0 % (ref 0.0–0.2)

## 2018-07-19 LAB — URINALYSIS, COMPLETE (UACMP) WITH MICROSCOPIC
Bilirubin Urine: NEGATIVE
Glucose, UA: >=500 mg/dL — AB
Ketones, ur: 20 mg/dL — AB
Nitrite: NEGATIVE
Protein, ur: 100 mg/dL — AB
RBC / HPF: >50 RBC/hpf — ABNORMAL HIGH (ref 0–5)
Specific Gravity, Urine: 1.016 (ref 1.005–1.030)
pH: 6 (ref 5.0–8.0)

## 2018-07-19 LAB — PROTIME-INR
INR: 0.9 (ref 0.8–1.2)
Prothrombin Time: 12.2 seconds (ref 11.4–15.2)

## 2018-07-19 LAB — SARS CORONAVIRUS 2 BY RT PCR (HOSPITAL ORDER, PERFORMED IN ~~LOC~~ HOSPITAL LAB): SARS Coronavirus 2: NEGATIVE

## 2018-07-19 LAB — TSH: TSH: 1.168 u[IU]/mL (ref 0.350–4.500)

## 2018-07-19 LAB — GLUCOSE, CAPILLARY: Glucose-Capillary: 248 mg/dL — ABNORMAL HIGH (ref 70–99)

## 2018-07-19 LAB — TROPONIN I: Troponin I: 0.15 ng/mL (ref <0.03)

## 2018-07-19 LAB — APTT: aPTT: 33 seconds (ref 24–36)

## 2018-07-19 MED ORDER — SODIUM CHLORIDE 0.9 % IV SOLN
1.0000 g | Freq: Once | INTRAVENOUS | Status: AC
  Administered 2018-07-19: 1 g via INTRAVENOUS
  Filled 2018-07-19: qty 10

## 2018-07-19 MED ORDER — SODIUM CHLORIDE 0.9 % IV SOLN
Freq: Once | INTRAVENOUS | Status: AC
  Administered 2018-07-19: 16:00:00 via INTRAVENOUS

## 2018-07-19 MED ORDER — ONDANSETRON HCL 4 MG/2ML IJ SOLN
4.0000 mg | Freq: Once | INTRAMUSCULAR | Status: AC
  Administered 2018-07-19: 4 mg via INTRAVENOUS
  Filled 2018-07-19: qty 2

## 2018-07-19 NOTE — ED Notes (Signed)
Sats down to 88% on RA, placed on 2L via [place]. Pt requesting water at this time, pt still NPO, given oral swabs.

## 2018-07-19 NOTE — ED Provider Notes (Signed)
[facility] Emergency Department Provider Note  ____________________________________________  Time seen: Approximately 3:51 PM  I have reviewed the triage vital signs and the nursing notes.   HISTORY  Chief Complaint Fall    HPI Paula Hansen is a 84 y.o. female who presents the emergency department complaining of significant weakness, nausea and vomiting, multiple falls.  Patient reports that yesterday she felt her normal self.  Her and her husband went and ate dinner last night at a restaurant and they typically frequent.  She reports that during the night she began to feel nauseated and went to the restroom to vomit.  Patient reports that she was excessively weak did not make it to the bathroom before falling.  Patient states that she fell multiple times last night due to weakness while attempting to go to the bathroom to vomit.  Patient believes that she may have passed out a few times as well.  Patient did hit her head at least once during falls, she has also hit her chest.  Patient reports that she has multiple bruises to her upper extremities, chest wall and chin from her falls.  Patient denies any headache, neck pain, chest pain, shortness of breath, abdominal pain.  She denies any diarrhea or constipation.  She denies any urinary complaints of dysuria, polyuria, hematuria.  Patient reports that emesis is clear.  No hematic emesis.  No bilious vomit.  Patient denies any nasal congestion, sore throat, cough, shortness of breath.  Patient does have a history of diabetes, GERD, hypercholesterolemia, hypertension, hypothyroidism.        Past Medical History:  Diagnosis Date  . Arthritis   . Diabetes mellitus   . Diabetic neuropathy (HCC)   . GERD (gastroesophageal reflux disease)   . Hypercholesteremia   . Hypertension   . Hypothyroidism   . Lumbar stenosis    L3-L4 left  . PONV (postoperative nausea and vomiting)   . Wears glasses   . Wears partial  dentures    lower    Patient Active Problem List   Diagnosis Date Noted  . S/P lumbar laminectomy 10/22/2015    Past Surgical History:  Procedure Laterality Date  . ABDOMINAL HYSTERECTOMY  1978  . COLONOSCOPY    . COLONOSCOPY W/ POLYPECTOMY    . DILATION AND CURETTAGE OF UTERUS    . JOINT REPLACEMENT     left knee October 2012.  Marland Kitchen KNEE ARTHROSCOPY W/ MENISCAL REPAIR     left knee  . LUMBAR LAMINECTOMY/DECOMPRESSION MICRODISCECTOMY  08/17/2011   Procedure: LUMBAR LAMINECTOMY/DECOMPRESSION MICRODISCECTOMY 1 LEVEL;  Surgeon: Clydene Fake, MD;  Location: [facility];  Service: Neurosurgery;  Laterality: Right;  Right Lumbar Two-Three Laminectomy/Diskectomy  . LUMBAR LAMINECTOMY/DECOMPRESSION MICRODISCECTOMY Left 10/22/2015   Procedure: Laminectomy and Foraminotomy - Lumbar three-lumbar four, left;  Surgeon: Tia Alert, MD;  Location: [facility];  Service: Neurosurgery;  Laterality: Left;  . TOOTH EXTRACTION      Prior to Admission medications   Medication Sig Start Date End Date Taking? Authorizing Provider  amLODipine-benazepril (LOTREL) 10-20 MG per capsule Take 1 capsule by mouth daily.    [provider]  aspirin EC 81 MG tablet Take 81 mg by mouth every other day.     [provider]  Biotin 2.5 MG TABS Take 2,500 mcg by mouth daily.     [provider]  Calcium Carbonate-Vit D-Min (CALTRATE PLUS PO) Take 1 tablet by mouth daily.    [provider]  cetirizine (ZYRTEC) 10 MG tablet Take 10 mg by mouth daily.    [provider]  Cholecalciferol (VITAMIN D-1000 MAX ST) 1000 units tablet Take 1,000 Units by mouth daily.     [provider]  Coenzyme Q10 10 MG capsule Take 10 mg by mouth daily.    [provider]  esomeprazole (NEXIUM) 40 MG capsule Take 40 mg by mouth daily before breakfast.    [provider]  Flaxseed, Linseed, (FLAXSEED OIL) 1000 MG CAPS Take 1,000 mg by mouth daily.    [provider]  FLAXSEED, LINSEED, PO Take 1 capsule by mouth daily.    [provider]  gabapentin (NEURONTIN) 300 MG capsule Take 300 mg by mouth 3 (three) times daily.    [provider]  glipiZIDE (GLUCOTROL XL) 10 MG 24 hr tablet Take 10 mg by mouth 2 (two) times daily.  08/02/15   [provider]  levothyroxine (SYNTHROID, LEVOTHROID) 50 MCG tablet Take 50 mcg by mouth daily before breakfast.  08/19/15   [provider]  metFORMIN (GLUCOPHAGE) 500 MG tablet Take 1,000 mg by mouth 2 (two) times daily with a meal.     [provider]  metFORMIN (GLUCOPHAGE-XR) 500 MG 24 hr tablet Take 1,000 mg by mouth 2 (two) times daily. 05/12/15 05/11/16  [provider]  Multiple Vitamin (MULTIVITAMIN WITH MINERALS) TABS Take 1 tablet by mouth daily.    [provider]  naproxen sodium (ANAPROX) 220 MG tablet Take 220 mg by mouth 2 (two) times daily as needed (for pain).     [provider]  Probiotic Product (ALIGN PO) Take by mouth.    [provider]  rosuvastatin (CRESTOR) 10 MG tablet Take 10 mg by mouth every other day.    [provider]  traMADol (ULTRAM) 50 MG tablet Take 1 tablet (50 mg total) by mouth every 6 (six) hours as needed for moderate pain. 10/22/15   Tia Alert, MD  vitamin E 400 UNIT capsule Take 400 Units by mouth daily.    [provider]    Allergies Nickel; Atorvastatin; Ezetimibe-simvastatin; Pravastatin; Simvastatin; Amoxicillin; Codeine; Morphine and related; Oxycodone; Shellfish allergy; and Sulfa antibiotics  Family History  Problem Relation Age of Onset  . Breast cancer Maternal Aunt        50's  . Stroke Mother   . Stroke Father   . Cancer - Other Brother   . Breast cancer Cousin     Social History Social History   Tobacco Use  . Smoking status: Former Smoker    Years: 30.00    Types: Cigarettes  . Smokeless tobacco: Never Used  . Tobacco comment: quit  in  2000-2001  Substance Use Topics  . Alcohol use: No  . Drug use: No     Review of Systems  Constitutional: No fever/chills Eyes: No visual changes. No discharge ENT: No upper respiratory complaints. Cardiovascular: no chest pain. Respiratory: no cough. No SOB. Gastrointestinal: No abdominal pain.  Positive for nausea and vomiting.  No diarrhea.  No constipation. Genitourinary: Negative for dysuria. No hematuria Musculoskeletal: Negative for musculoskeletal pain. Skin: Negative for rash, abrasions, lacerations, ecchymosis. Neurological: Negative for headaches, focal weakness or numbness. 10-point ROS otherwise negative.  ____________________________________________   PHYSICAL EXAM:  VITAL SIGNS: ED Triage Vitals  Enc Vitals Group     BP 07/19/18 1517 (!) 201/113     Pulse --      Resp 07/19/18 1517 20  Temp 07/19/18 1520 97.6 F (36.4 C)     Temp Source 07/19/18 1520 Oral     SpO2 07/19/18 1514 91 %     Weight 07/19/18 1518 160 lb (72.6 kg)     Height 07/19/18 1518  (1.6 m)     Head Circumference --      Peak Flow --      Pain Score 07/19/18 1518 0     Pain Loc --      Pain Edu? --      Excl. in GC? --      Constitutional: Alert and oriented. Well appearing and in no acute distress. Eyes: Conjunctivae are normal. PERRL. EOMI. Head: Patient does have ecchymosis to the left chin.  No open wounds.  Patient is able to move the mandible appropriately.  Palpation reveals no tenderness over ecchymosis.  No palpable abnormality underlying ecchymosis.  No other visible signs of trauma to the skull or face.  No tenderness to palpation of the osseous structures of the skull.  No battle signs, raccoon eyes, serosanguineous fluid drainage from the ears or nares. ENT:      Ears:       Nose: No congestion/rhinnorhea.      Mouth/Throat: Mucous membranes are dry.  Oropharynx is nonerythematous and nonedematous.  Uvula is midline. Neck: No stridor.  Neck is supple full range  of motion.  No tenderness to palpation along the cervical spine.  No palpable abnormality.  Radial pulse intact bilateral upper extremities.  Sensation intact and equal in all dermatomal distributions bilateral upper extremities. Hematological/Lymphatic/Immunilogical: No cervical lymphadenopathy. Cardiovascular: Normal rate, regular rhythm. Normal S1 and S2.  Good peripheral circulation. Respiratory: Normal respiratory effort without tachypnea or retractions. Lungs CTAB. Good air entry to the bases with no decreased or absent breath sounds. Gastrointestinal: Bowel sounds 4 quadrants. Soft and nontender to palpation. No guarding or rigidity. No palpable masses. No distention. No CVA tenderness. Musculoskeletal: Full range of motion to all extremities. No gross deformities appreciated.  Patient with multiple areas of ecchymosis to bilateral upper extremities.  No deformity.  Full range of motion to bilateral shoulders, bilateral elbows, bilateral wrist.  Patient is nontender to palpation over the osseous structures of bilateral upper extremities.  No palpable abnormalities.  Patient is superficial skin tear to the right forearm.  No active bleeding.  No foreign body. Neurologic:  Normal speech and language. No gross focal neurologic deficits are appreciated.  Cranial nerves II through XII grossly intact.  Negative Romberg's and pronator drift.  Sensation intact in bilateral upper extremities and bilateral lower extremities in all dermatomal distributions. Skin:  Skin is warm, dry and intact. No rash noted. Psychiatric: Mood and affect are normal. Speech and behavior are normal. Patient exhibits appropriate insight and judgement.   ____________________________________________   LABS (all labs ordered are listed, but only abnormal results are displayed)  Labs Reviewed  BASIC METABOLIC PANEL - Abnormal; Notable for the following components:      Result Value   Glucose, Bld 264 (*)    BUN 34 (*)     Creatinine, Ser 1.04 (*)    GFR calc non Af Amer 50 (*)    GFR calc Af Amer 58 (*)    All other components within normal limits  CBC - Abnormal; Notable for the following components:   WBC 18.4 (*)    All other components within normal limits  URINALYSIS, COMPLETE (UACMP) WITH MICROSCOPIC - Abnormal; Notable for the following components:  Color, Urine YELLOW (*)    APPearance HAZY (*)    Glucose, UA >=500 (*)    Hgb urine dipstick MODERATE (*)    Ketones, ur 20 (*)    Protein, ur 100 (*)    Leukocytes,Ua TRACE (*)    RBC / HPF >50 (*)    Bacteria, UA RARE (*)    All other components within normal limits  TROPONIN I - Abnormal; Notable for the following components:   Troponin I 0.15 (*)    All other components within normal limits  GLUCOSE, CAPILLARY - Abnormal; Notable for the following components:   Glucose-Capillary 248 (*)    All other components within normal limits  SARS CORONAVIRUS 2 (HOSPITAL ORDER, PERFORMED IN [facility])  URINE CULTURE  CULTURE, BLOOD (ROUTINE X 2)  CULTURE, BLOOD (ROUTINE X 2)  LIPASE, BLOOD  TSH  LACTIC ACID, PLASMA  PROTIME-INR  APTT  T3  T4  LACTIC ACID, PLASMA  URINALYSIS, ROUTINE W REFLEX MICROSCOPIC  CBG MONITORING, ED   ____________________________________________  EKG  ED ECG REPORT I, Delorise Royals Elmin Wiederholt,  personally viewed and interpreted this ECG.   Date: 07/19/2018  EKG Time: 1519 hrs.  Rate: 99 bpm  Rhythm: normal sinus rhythm, occasional PVC noted, unifocal  Axis: Left axis deviation  Intervals:Prolonged PR interval  ST&T Change: No ST elevation or depression noted  Normal sinus rhythm.  UVC identified.  Slightly prolonged PR interval.  No STEMI.  ____________________________________________  RADIOLOGY I personally viewed and evaluated these images as part of my medical decision making, as well as reviewing the written report by the radiologist.  I reviewed imaging with radiologist and agree with  parenchymal hematoma measuring 4 x 2 x 2.5 cm.  No midline shift.  Dg Chest 2 View  Result Date: 07/19/2018 CLINICAL DATA:  Multiple falls.  Chest contusion. EXAM: CHEST - 2 VIEW COMPARISON:  10/15/2015 FINDINGS: There is mild elevation of the right hemidiaphragm. The lungs are essentially clear. There are some scattered areas of scarring or atelectasis that appear chronic. Aortic calcifications are noted. No pneumothorax. There is a mild irregularity of the anterior third rib on the left that is likely old or artifact. IMPRESSION: No active cardiopulmonary disease. Electronically Signed   By: Katherine Mantle M.D.   On: 07/19/2018 17:14   Ct Head Wo Contrast  Result Date: 07/19/2018 CLINICAL DATA:  Multiple falls.  Hit head.  Fell in bathroom. EXAM: CT HEAD WITHOUT CONTRAST CT CERVICAL SPINE WITHOUT CONTRAST TECHNIQUE: Multidetector CT imaging of the head and cervical spine was performed following the standard protocol without intravenous contrast. Multiplanar CT image reconstructions of the cervical spine were also generated. COMPARISON:  None. FINDINGS: CT HEAD FINDINGS Brain: There is a 4.2 x 2.5 cm intraparenchymal hematoma noted in the right cerebellar hemisphere near the middle cerebellar peduncle. There is slight mass effect on the fourth ventricle. No subarachnoid hemorrhage is identified. No other intraparenchymal hematomas are seen. The ventricles are in the midline without mass effect or shift. They are normal in size and configuration. There is moderate periventricular white matter disease and mild age related cerebral atrophy. No masses identified. Vascular: Vascular calcifications are noted but no definite aneurysm or hyperdense vessels. Skull: No skull fractures identified.  No bone lesions. Sinuses/Orbits: The paranasal sinuses and mastoid air cells are clear. The globes are intact. Other: There is a small right posterior parietal scalp hematoma without underlying skull fracture. CT  CERVICAL SPINE FINDINGS Alignment: Normal overall alignment. Skull  base and vertebrae: Degenerative changes at C1-2 with mild pannus formation and possible erosions. No acute cervical spine fracture is identified. The facets are normally aligned. No facet or laminar fractures. Advanced multilevel facet disease. Soft tissues and spinal canal: The spinal canal is quite generous. No canal stenosis. No abnormal prevertebral soft tissue swelling. Disc levels: Multilevel foraminal narrowing due to facet disease and uncinate spurring. No large disc protrusions or significant canal compromise. Upper chest: The lung apices are grossly clear. Other: Advanced calcifications involving the carotid arteries bilaterally. IMPRESSION: 1. Right cerebellar intraparenchymal hematoma measuring 4.2 x 2.5 cm. Mild mass effect on the fourth ventricle. 2. Right posterior parietal scalp hematoma but no skull fractures. 3. Advanced degenerative cervical spondylosis with multilevel disc disease and facet disease but no acute cervical spine fracture. These results were called by telephone at the time of interpretation on 07/19/2018 at 5:10 pm to [facility] [place] [place] , who verbally acknowledged these results. Electronically Signed   By: Rudie Meyer M.D.   On: 07/19/2018 17:11   Ct Cervical Spine Wo Contrast  Result Date: 07/19/2018 CLINICAL DATA:  Multiple falls.  Hit head.  Fell in bathroom. EXAM: CT HEAD WITHOUT CONTRAST CT CERVICAL SPINE WITHOUT CONTRAST TECHNIQUE: Multidetector CT imaging of the head and cervical spine was performed following the standard protocol without intravenous contrast. Multiplanar CT image reconstructions of the cervical spine were also generated. COMPARISON:  None. FINDINGS: CT HEAD FINDINGS Brain: There is a 4.2 x 2.5 cm intraparenchymal hematoma noted in the right cerebellar hemisphere near the middle cerebellar peduncle. There is slight mass effect on the fourth ventricle. No subarachnoid hemorrhage is  identified. No other intraparenchymal hematomas are seen. The ventricles are in the midline without mass effect or shift. They are normal in size and configuration. There is moderate periventricular white matter disease and mild age related cerebral atrophy. No masses identified. Vascular: Vascular calcifications are noted but no definite aneurysm or hyperdense vessels. Skull: No skull fractures identified.  No bone lesions. Sinuses/Orbits: The paranasal sinuses and mastoid air cells are clear. The globes are intact. Other: There is a small right posterior parietal scalp hematoma without underlying skull fracture. CT CERVICAL SPINE FINDINGS Alignment: Normal overall alignment. Skull base and vertebrae: Degenerative changes at C1-2 with mild pannus formation and possible erosions. No acute cervical spine fracture is identified. The facets are normally aligned. No facet or laminar fractures. Advanced multilevel facet disease. Soft tissues and spinal canal: The spinal canal is quite generous. No canal stenosis. No abnormal prevertebral soft tissue swelling. Disc levels: Multilevel foraminal narrowing due to facet disease and uncinate spurring. No large disc protrusions or significant canal compromise. Upper chest: The lung apices are grossly clear. Other: Advanced calcifications involving the carotid arteries bilaterally. IMPRESSION: 1. Right cerebellar intraparenchymal hematoma measuring 4.2 x 2.5 cm. Mild mass effect on the fourth ventricle. 2. Right posterior parietal scalp hematoma but no skull fractures. 3. Advanced degenerative cervical spondylosis with multilevel disc disease and facet disease but no acute cervical spine fracture. These results were called by telephone at the time of interpretation on 07/19/2018 at 5:10 pm to [facility] [place] , who verbally acknowledged these results. Electronically Signed   By: Rudie Meyer M.D.   On: 07/19/2018 17:11     ____________________________________________    PROCEDURES  Procedure(s) performed:    .Critical Care Performed by: Racheal Patches, PA-C Authorized by: Racheal Patches, PA-C   Critical care provider statement:    Critical care time (minutes):  45   Critical care time was exclusive of:  Separately billable procedures and treating other patients   Critical care was necessary to treat or prevent imminent or life-threatening deterioration of the following conditions:  CNS failure or compromise   Critical care was time spent personally by me on the following activities:  Development of treatment plan with patient or surrogate, discussions with consultants, evaluation of patient's response to treatment, examination of patient, obtaining history from patient or surrogate, ordering and review of laboratory studies, ordering and review of radiographic studies, re-evaluation of patient's condition and review of old charts      Medications  ondansetron (ZOFRAN) injection 4 mg (4 mg Intravenous Given 07/19/18 1608)  0.9 %  sodium chloride infusion ( Intravenous New Bag/Given 07/19/18 1612)  cefTRIAXone (ROCEPHIN) 1 g in sodium chloride 0.9 % 100 mL IVPB (0 g Intravenous Stopped 07/19/18 1908)     ____________________________________________   INITIAL IMPRESSION / ASSESSMENT AND PLAN / ED COURSE  Pertinent labs & imaging results that were available during my care of the patient were reviewed by me and considered in my medical decision making (see chart for details).  Review of the [place] CSRS was performed in accordance of the [facility] prior to dispensing any controlled drugs.  Clinical Course as of Jul 19 1934  Wed Jul 19, 2018  1716 Patient presents emergency department complaining of weakness, multiple falls, nausea vomiting.  Patient reports that last night she was her normal self, but developed nausea vomiting throughout the night.  She has had multiple falls striking her  head and her chest during falls.  Patient does have an elevated troponin at 0.15, findings on urinalysis concerning for urinary tract infection, elevated blood glucose reading.  Critical result was called to me by radiologist regarding parenchymal hematoma.  At this time, patient will receive some IV fluid given dry mucous membranes.  Patient will also be given antibiotics for UTI.   [JC]    Clinical Course User Index [JC] Serene Kopf, Delorise RoyalsJonathan D, PA-C          Patient's diagnosis is consistent with fall at home resulting in intraparenchymal cerebral hemorrhage, multiple contusions, UTI.  Patient presented to the emergency department complaining of weakness, nausea and vomiting.  Patient reports that symptoms began overnight, she fell multiple times while ambulating to the bathroom to vomit.  Patient reportedly struck her head at least twice, chest on the third encounter.  Patient was neurologically intact, alert and oriented.  Patient does have multiple areas of ecchymosis to the chest, bilateral upper extremities, chin.  Given patient's history, physical exam findings patient received imaging of the head and neck, chest as well as labs.  Patient does have findings consistent/concerning for urinary tract infection which would explain nausea and vomiting and weakness.  Patient was treated with antibiotics and mild fluids as patient was hypertensive.  CT returns with intraparenchymal hemorrhage in the right cerebellar region.  Given this finding, I contacted [facility] trauma for transfer.  Neurosurgery, Dr. Lynwood DawleyHadar accepts the patient.  Patient will be transferred from emergency department to emergency department.. [facility]    ____________________________________________  FINAL CLINICAL IMPRESSION(S) / ED DIAGNOSES  Final diagnoses:  Fall in home, initial encounter  Traumatic hemorrhage of right cerebrum with loss of consciousness of 30 minutes or less, initial encounter  (HCC)      NEW MEDICATIONS STARTED DURING THIS VISIT:  ED Discharge Orders    None  This chart was dictated using voice recognition software/Dragon. Despite best efforts to proofread, errors can occur which can change the meaning. Any change was purely unintentional.    Lanette Hampshire 07/19/18 2039    Jene Every, MD 07/19/18 [phone]

## 2018-07-19 NOTE — ED Triage Notes (Signed)
Pt ate BBQ yesterday and since last night has been vomiting. Pt denies diarrhea or fevers. Pt is diaphoretic on arrival and vomiting. [facility] called out for a fall. Pt fell while vomiting in the bathroom. Pt has skin tears to her RUE. Pt denies blood thinners and LOC

## 2018-07-19 NOTE — ED Notes (Signed)
Pt unable to sign transfer sheet due to diagnosis and condition.

## 2018-07-20 LAB — T3: T3, Total: 99 ng/dL (ref 71–180)

## 2018-07-20 LAB — T4: T4, Total: 9.1 ug/dL (ref 4.5–12.0)

## 2018-07-22 LAB — URINE CULTURE
Culture: >=100000 — AB
Special Requests: NORMAL

## 2018-07-24 DIAGNOSIS — I614 Nontraumatic intracerebral hemorrhage in cerebellum: Secondary | ICD-10-CM | POA: Insufficient documentation

## 2018-07-24 LAB — CULTURE, BLOOD (ROUTINE X 2)
Culture: NO GROWTH
Culture: NO GROWTH
Special Requests: ADEQUATE

## 2018-11-06 ENCOUNTER — Other Ambulatory Visit: Payer: Self-pay | Admitting: Internal Medicine

## 2018-11-06 DIAGNOSIS — Z1231 Encounter for screening mammogram for malignant neoplasm of breast: Secondary | ICD-10-CM

## 2018-12-13 ENCOUNTER — Ambulatory Visit
Admission: RE | Admit: 2018-12-13 | Discharge: 2018-12-13 | Disposition: A | Discharge status: Home / Self Care | Payer: Medicare Other | Source: Ambulatory Visit | Attending: Internal Medicine | Admitting: Internal Medicine

## 2018-12-13 DIAGNOSIS — Z1231 Encounter for screening mammogram for malignant neoplasm of breast: Secondary | ICD-10-CM | POA: Diagnosis present

## 2019-05-11 DIAGNOSIS — E113293 Type 2 diabetes mellitus with mild nonproliferative diabetic retinopathy without macular edema, bilateral: Secondary | ICD-10-CM | POA: Insufficient documentation

## 2019-09-13 ENCOUNTER — Emergency Department: Payer: Medicare Other

## 2019-09-13 ENCOUNTER — Emergency Department
Admission: EM | Admit: 2019-09-13 | Discharge: 2019-09-13 | Disposition: A | Discharge status: Home / Self Care | Payer: Medicare Other | Attending: Emergency Medicine | Admitting: Emergency Medicine

## 2019-09-13 ENCOUNTER — Other Ambulatory Visit: Payer: Self-pay

## 2019-09-13 ENCOUNTER — Encounter: Payer: Self-pay | Admitting: Emergency Medicine

## 2019-09-13 DIAGNOSIS — W01198A Fall on same level from slipping, tripping and stumbling with subsequent striking against other object, initial encounter: Secondary | ICD-10-CM | POA: Insufficient documentation

## 2019-09-13 DIAGNOSIS — Y999 Unspecified external cause status: Secondary | ICD-10-CM | POA: Insufficient documentation

## 2019-09-13 DIAGNOSIS — E1140 Type 2 diabetes mellitus with diabetic neuropathy, unspecified: Secondary | ICD-10-CM | POA: Insufficient documentation

## 2019-09-13 DIAGNOSIS — Z79899 Other long term (current) drug therapy: Secondary | ICD-10-CM | POA: Diagnosis not present

## 2019-09-13 DIAGNOSIS — I10 Essential (primary) hypertension: Secondary | ICD-10-CM | POA: Insufficient documentation

## 2019-09-13 DIAGNOSIS — E039 Hypothyroidism, unspecified: Secondary | ICD-10-CM | POA: Diagnosis not present

## 2019-09-13 DIAGNOSIS — Z7984 Long term (current) use of oral hypoglycemic drugs: Secondary | ICD-10-CM | POA: Diagnosis not present

## 2019-09-13 DIAGNOSIS — S0003XA Contusion of scalp, initial encounter: Secondary | ICD-10-CM | POA: Insufficient documentation

## 2019-09-13 DIAGNOSIS — Z87891 Personal history of nicotine dependence: Secondary | ICD-10-CM | POA: Insufficient documentation

## 2019-09-13 DIAGNOSIS — S0990XA Unspecified injury of head, initial encounter: Secondary | ICD-10-CM

## 2019-09-13 DIAGNOSIS — Y92009 Unspecified place in unspecified non-institutional (private) residence as the place of occurrence of the external cause: Secondary | ICD-10-CM | POA: Diagnosis not present

## 2019-09-13 DIAGNOSIS — I609 Nontraumatic subarachnoid hemorrhage, unspecified: Secondary | ICD-10-CM

## 2019-09-13 DIAGNOSIS — Z7982 Long term (current) use of aspirin: Secondary | ICD-10-CM | POA: Insufficient documentation

## 2019-09-13 DIAGNOSIS — W19XXXA Unspecified fall, initial encounter: Secondary | ICD-10-CM

## 2019-09-13 DIAGNOSIS — Y9389 Activity, other specified: Secondary | ICD-10-CM | POA: Diagnosis not present

## 2019-09-13 DIAGNOSIS — S066X0A Traumatic subarachnoid hemorrhage without loss of consciousness, initial encounter: Secondary | ICD-10-CM | POA: Insufficient documentation

## 2019-09-13 LAB — BASIC METABOLIC PANEL
Anion gap: 9 (ref 5–15)
BUN: 32 mg/dL — ABNORMAL HIGH (ref 8–23)
CO2: 25 mmol/L (ref 22–32)
Calcium: 9.7 mg/dL (ref 8.9–10.3)
Chloride: 104 mmol/L (ref 98–111)
Creatinine, Ser: 1.28 mg/dL — ABNORMAL HIGH (ref 0.44–1.00)
GFR calc Af Amer: 45 mL/min — ABNORMAL LOW (ref >60)
GFR calc non Af Amer: 39 mL/min — ABNORMAL LOW (ref >60)
Glucose, Bld: 169 mg/dL — ABNORMAL HIGH (ref 70–99)
Potassium: 4.4 mmol/L (ref 3.5–5.1)
Sodium: 138 mmol/L (ref 135–145)

## 2019-09-13 LAB — CBC
HCT: 41.2 % (ref 36.0–46.0)
Hemoglobin: 13.5 g/dL (ref 12.0–15.0)
MCH: 32.1 pg (ref 26.0–34.0)
MCHC: 32.8 g/dL (ref 30.0–36.0)
MCV: 98.1 fL (ref 80.0–100.0)
Platelets: 370 10*3/uL (ref 150–400)
RBC: 4.2 MIL/uL (ref 3.87–5.11)
RDW: 13.3 % (ref 11.5–15.5)
WBC: 11.5 10*3/uL — ABNORMAL HIGH (ref 4.0–10.5)
nRBC: 0 % (ref 0.0–0.2)

## 2019-09-13 LAB — PROTIME-INR
INR: 0.8 (ref 0.8–1.2)
Prothrombin Time: 11.2 seconds — ABNORMAL LOW (ref 11.4–15.2)

## 2019-09-13 LAB — APTT: aPTT: 34 seconds (ref 24–36)

## 2019-09-13 MED ORDER — HYDRALAZINE HCL 50 MG PO TABS
25.0000 mg | ORAL_TABLET | Freq: Once | ORAL | Status: AC
  Administered 2019-09-13: 25 mg via ORAL
  Filled 2019-09-13: qty 1

## 2019-09-13 MED ORDER — HYDRALAZINE HCL 20 MG/ML IJ SOLN
10.0000 mg | Freq: Once | INTRAMUSCULAR | Status: AC
  Administered 2019-09-13: 10 mg via INTRAVENOUS
  Filled 2019-09-13: qty 1

## 2019-09-13 NOTE — Consult Note (Signed)
Referring Physician:  No referring provider defined for this encounter.  Primary Physician:  Paula Nurse, MD  Chief Complaint:  ICH after fall History of Present Illness:Paula Hansen is a 85 y.o. right-handed  female with prior history of intracerebral hemorrhage in June 2020, CKD, DM, GERD, Hypothyroidism, HTN,  squamous cell carcinoma of the tongue, who presents to the [place] ED after suffering a fall earlier this morning.  She reports that she was at her sister-in-law's house and while she was exiting the house she slipped off the concrete steps and fell, striking her head on the bumper of her car.  She denies any loss of consciousness, speech changes, blurry vision, or headache afterwards.   She presented to the [facility] ED and a head CT showed a "A tiny focus of subarachnoid hemorrhage, however, is identified at the vertex adjacent to the left precentral sulcus, best noted on axial image # 24 in sagittal image # 32. No associated mass effect or midline Shift."   Cervical spine CT was also completed which was negative for any acute fracture.  Paula Hansen did take aspirin today.  On exam, she is alert, oriented x3, and without any neurologic deficit.  She denies any headache, however she does feel some "soreness" to the right posterior skull where she has a small amount of bruising and acute bleeding to the site. Her blood pressure is elevated to the 200s systolic on my interview.  Prior to admission, Paula Hansen has been fully independent in activities of daily living.  She lives at home with her husband. In regard to her prior ICH in 2020, she has been followed by [facility] and reports that she will return to the for continued follow-up.  Review of Systems:  A 10 point review of systems is negative, except for the pertinent positives and negatives detailed in the HPI.  Past Medical History: Past Medical History:  Diagnosis Date  . Arthritis   . Diabetes mellitus   . Diabetic neuropathy  (HCC)   . GERD (gastroesophageal reflux disease)   . Hypercholesteremia   . Hypertension   . Hypothyroidism   . Lumbar stenosis    L3-L4 left  . PONV (postoperative nausea and vomiting)   . Wears glasses   . Wears partial dentures    lower    Past Surgical History: Past Surgical History:  Procedure Laterality Date  . ABDOMINAL HYSTERECTOMY  1978  . COLONOSCOPY    . COLONOSCOPY W/ POLYPECTOMY    . DILATION AND CURETTAGE OF UTERUS    . JOINT REPLACEMENT     left knee October 2012.  Marland Kitchen KNEE ARTHROSCOPY W/ MENISCAL REPAIR     left knee  . LUMBAR LAMINECTOMY/DECOMPRESSION MICRODISCECTOMY  08/17/2011   Procedure: LUMBAR LAMINECTOMY/DECOMPRESSION MICRODISCECTOMY 1 LEVEL;  Surgeon: Clydene Fake, MD;  Location: [facility];  Service: Neurosurgery;  Laterality: Right;  Right Lumbar Two-Three Laminectomy/Diskectomy  . LUMBAR LAMINECTOMY/DECOMPRESSION MICRODISCECTOMY Left 10/22/2015   Procedure: Laminectomy and Foraminotomy - Lumbar three-lumbar four, left;  Surgeon: Tia Alert, MD;  Location: [facility];  Service: Neurosurgery;  Laterality: Left;  . TOOTH EXTRACTION      Allergies: Allergies as of 09/13/2019 - Review Complete 09/13/2019  Allergen Reaction Noted  . Nickel Other (See Comments) 10/21/2015  . Atorvastatin Other (See Comments) 08/13/2013  . Ezetimibe-simvastatin Other (See Comments) 08/13/2013  . Pravastatin Other (See Comments) 08/13/2013  . Simvastatin Other (See Comments) 08/13/2013  . Amoxicillin Other (See Comments) 08/13/2011  . Codeine  Other (See Comments) 08/13/2011  . Morphine and related Nausea And Vomiting 08/13/2011  . Oxycodone Nausea And Vomiting 08/13/2011  . Shellfish allergy Nausea And Vomiting 08/16/2011  . Sulfa antibiotics Nausea Only and Other (See Comments) 08/13/2011    Medications:  Current Facility-Administered Medications:  .  hydrALAZINE (APRESOLINE) tablet 25 mg, 25 mg, Oral, Once, Delton Prairie, MD  Current Outpatient Medications:   .  amLODipine-benazepril (LOTREL) 10-20 MG per capsule, Take 1 capsule by mouth daily., Disp: , Rfl:  .  aspirin EC 81 MG tablet, Take 81 mg by mouth every other day. , Disp: , Rfl:  .  Biotin 2.5 MG TABS, Take 2,500 mcg by mouth daily. , Disp: , Rfl:  .  Calcium Carbonate-Vit D-Min (CALTRATE PLUS PO), Take 1 tablet by mouth daily., Disp: , Rfl:  .  cetirizine (ZYRTEC) 10 MG tablet, Take 10 mg by mouth daily., Disp: , Rfl:  .  Cholecalciferol (VITAMIN D-1000 MAX ST) 1000 units tablet, Take 1,000 Units by mouth daily. , Disp: , Rfl:  .  Coenzyme Q10 10 MG capsule, Take 10 mg by mouth daily., Disp: , Rfl:  .  esomeprazole (NEXIUM) 40 MG capsule, Take 40 mg by mouth daily before breakfast., Disp: , Rfl:  .  Flaxseed, Linseed, (FLAXSEED OIL) 1000 MG CAPS, Take 1,000 mg by mouth daily., Disp: , Rfl:  .  FLAXSEED, LINSEED, PO, Take 1 capsule by mouth daily., Disp: , Rfl:  .  gabapentin (NEURONTIN) 300 MG capsule, Take 300 mg by mouth 3 (three) times daily., Disp: , Rfl:  .  glipiZIDE (GLUCOTROL XL) 10 MG 24 hr tablet, Take 10 mg by mouth 2 (two) times daily. , Disp: , Rfl:  .  levothyroxine (SYNTHROID, LEVOTHROID) 50 MCG tablet, Take 50 mcg by mouth daily before breakfast. , Disp: , Rfl:  .  metFORMIN (GLUCOPHAGE) 500 MG tablet, Take 1,000 mg by mouth 2 (two) times daily with a meal. , Disp: , Rfl:  .  metFORMIN (GLUCOPHAGE-XR) 500 MG 24 hr tablet, Take 1,000 mg by mouth 2 (two) times daily., Disp: , Rfl:  .  Multiple Vitamin (MULTIVITAMIN WITH MINERALS) TABS, Take 1 tablet by mouth daily., Disp: , Rfl:  .  naproxen sodium (ANAPROX) 220 MG tablet, Take 220 mg by mouth 2 (two) times daily as needed (for pain). , Disp: , Rfl:  .  Probiotic Product (ALIGN PO), Take by mouth., Disp: , Rfl:  .  rosuvastatin (CRESTOR) 10 MG tablet, Take 10 mg by mouth every other day., Disp: , Rfl:  .  traMADol (ULTRAM) 50 MG tablet, Take 1 tablet (50 mg total) by mouth every 6 (six) hours as needed for moderate pain.,  Disp: 30 tablet, Rfl: 1 .  vitamin E 400 UNIT capsule, Take 400 Units by mouth daily., Disp: , Rfl:    Social History: Social History   Tobacco Use  . Smoking status: Former Smoker    Years: 30.00    Types: Cigarettes  . Smokeless tobacco: Never Used  . Tobacco comment: quit  in 2000-2001  Substance Use Topics  . Alcohol use: No  . Drug use: No    Family Medical History: Family History  Problem Relation Age of Onset  . Breast cancer Maternal Aunt        50's  . Stroke Mother   . Stroke Father   . Cancer - Other Brother   . Breast cancer Cousin     Physical Examination: Vitals:   09/13/19 1428 09/13/19 1700  BP: 113/75 (!) 183/72  Pulse: 86 92  Resp: 18 19  Temp: 98.2 F (36.8 C)   SpO2: 95% 97%     General: Patient is well developed, well nourished, calm, collected, and in no apparent distress.  Psychiatric: Patient is non-anxious.  Head:  Pupils equal, round, and reactive to light.  Mild right parietal scalp hematoma noted, with acute bleeding.  ENT:  Oral mucosa appears well hydrated.  Neck:   Supple.  Full range of motion.  No tenderness to palpation over cervical spine  Respiratory: Patient is breathing without any difficulty.   NEUROLOGICAL:  General: In no acute distress.   Awake, alert, oriented to person, place, and time.  Able to name 3 out of 3 objects correctly.  Repeats simple phrases.  Performs simple calculations.  Speech is fluid and clear although she does wear partial dentures, which do affect her speech, however with removal her speech is clear. Pupils equal round and reactive to light.  EOMI. Facial tone is symmetric.  Tongue protrusion is midline.  There is no pronator drift. Finger-to-nose testing without ataxia or dysmetria bilaterally Shoulder shrug symmetric Her strength is full and symmetric to bilateral upper and lower extremities SI LT to bilateral upper and lower extremities.  Imaging:  FINDINGS: CT HEAD  FINDINGS  Brain: Normal anatomic configuration. Small focus of encephalomalacia within the right cerebellar hemisphere related to previous intraparenchymal hematoma, now resolved. A tiny focus of subarachnoid hemorrhage, however, is identified at the vertex adjacent to the left precentral sulcus, best noted on axial image # 24 in sagittal image # 32. No associated mass effect or midline shift. Moderate periventricular white matter changes are again identified most in keeping with the sequela of small vessel ischemia. Remote lacunar infarct noted within the right putamen.  No abnormal intra or extra-axial mass lesion or fluid collection. No abnormal mass effect or midline shift. No evidence of acute infarct. Ventricular size is normal. Cerebellum unremarkable.  Vascular: Extensive atherosclerotic calcifications are noted within the carotid siphons bilaterally. No hyperdense vasculature at the skull base.  Skull: Intact  Sinuses/Orbits: Paranasal sinuses are clear. Orbits are unremarkable.  Other: Mastoid air cells and middle ear cavities are clear. Moderate right parietal scalp hematoma is present.    Assessment and Plan: Ms. Flom is a pleasant 85 y.o. female with past medical history of intracerebral hemorrhage in June 2020, treated at [facility].  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  She was placed on aspirin as well as a statin for this.  She continues to receive follow-up from [facility] neurology.  She is seen today after suffering a traumatic SAH after a fall.   Reassuringly, her neurologic exam is intact.  Therefore, there is no acute neurosurgical intervention required at this time.  - Please repeat head CT in 6 hours from prior (approximately 9 PM) - Please maintain patient's blood pressure at less than or equal to 160 mmHg to prevent expansion of ICH - Please hold aspirin and  all other anticoagulation or blood thinning medication  Patsey Berthold, NP Dept. of Neurosurgery

## 2019-09-13 NOTE — ED Notes (Signed)
Pt presents following a mechanical fall w/ a wound to the back of the head. Pt denies LOC and is AxO x 4.

## 2019-09-13 NOTE — ED Notes (Signed)
NSGY at bedside

## 2019-09-13 NOTE — ED Notes (Signed)
To CT scan. AAOx3.  Skin warm and dry.  NAD

## 2019-09-13 NOTE — ED Triage Notes (Signed)
Patient presents to the ED with fall today. Patient states she slipped on a step of a carport.  Patient states her head hit the front bumper of her friend's car.  Patient is in no obvious distress at this time.  Alert and oriented x 4.  Patient states she takes aspirin daily.  Patient ambulatory with steady gait at this time.  Patient denies losing consciousness and denies any dizziness prior to the fall.

## 2019-09-13 NOTE — ED Provider Notes (Signed)
[facility] Emergency Department Provider Note ____________________________________________   First MD Initiated Contact with Patient 09/13/19 1551     (approximate)  I have reviewed the triage vital signs and the nursing notes.  HISTORY  Chief Complaint Head Injury and Fall   HPI Paula Hansen is a 85 y.o. female after head injury from a mechanical fall.    Patient takes ASA 81 as only antithrombotic medication.  She reports accidentally slipping on the bottom step of her sister-in-law's home as she was leaving the house, causing her to strike her right occiput on an adjacent car.  No syncope.  This occurred about 1 hour prior to arrival.  Denies subsequent syncope, vomiting.  She reports aching headache in the area of impact, but otherwise has no complaints and reports feeling well.    Past Medical History:  Diagnosis Date  . Arthritis   . Diabetes mellitus   . Diabetic neuropathy (HCC)   . GERD (gastroesophageal reflux disease)   . Hypercholesteremia   . Hypertension   . Hypothyroidism   . Lumbar stenosis    L3-L4 left  . PONV (postoperative nausea and vomiting)   . Wears glasses   . Wears partial dentures    lower    Patient Active Problem List   Diagnosis Date Noted  . S/P lumbar laminectomy 10/22/2015    Past Surgical History:  Procedure Laterality Date  . ABDOMINAL HYSTERECTOMY  1978  . COLONOSCOPY    . COLONOSCOPY W/ POLYPECTOMY    . DILATION AND CURETTAGE OF UTERUS    . JOINT REPLACEMENT     left knee October 2012.  Marland Kitchen KNEE ARTHROSCOPY W/ MENISCAL REPAIR     left knee  . LUMBAR LAMINECTOMY/DECOMPRESSION MICRODISCECTOMY  08/17/2011   Procedure: LUMBAR LAMINECTOMY/DECOMPRESSION MICRODISCECTOMY 1 LEVEL;  Surgeon: Clydene Fake, MD;  Location: [facility];  Service: Neurosurgery;  Laterality: Right;  Right Lumbar Two-Three Laminectomy/Diskectomy  . LUMBAR LAMINECTOMY/DECOMPRESSION MICRODISCECTOMY Left 10/22/2015   Procedure:  Laminectomy and Foraminotomy - Lumbar three-lumbar four, left;  Surgeon: Tia Alert, MD;  Location: [facility];  Service: Neurosurgery;  Laterality: Left;  . TOOTH EXTRACTION      Prior to Admission medications   Medication Sig Start Date End Date Taking? Authorizing Provider  amLODipine-benazepril (LOTREL) 10-20 MG per capsule Take 1 capsule by mouth daily.    [provider]  aspirin EC 81 MG tablet Take 81 mg by mouth every other day.     [provider]  Biotin 2.5 MG TABS Take 2,500 mcg by mouth daily.     [provider]  Calcium Carbonate-Vit D-Min (CALTRATE PLUS PO) Take 1 tablet by mouth daily.    [provider]  cetirizine (ZYRTEC) 10 MG tablet Take 10 mg by mouth daily.    [provider]  Cholecalciferol (VITAMIN D-1000 MAX ST) 1000 units tablet Take 1,000 Units by mouth daily.     [provider]  Coenzyme Q10 10 MG capsule Take 10 mg by mouth daily.    [provider]  esomeprazole (NEXIUM) 40 MG capsule Take 40 mg by mouth daily before breakfast.    [provider]  Flaxseed, Linseed, (FLAXSEED OIL) 1000 MG CAPS Take 1,000 mg by mouth daily.    [provider]  FLAXSEED, LINSEED, PO Take 1 capsule by mouth daily.    [provider]  gabapentin (NEURONTIN) 300 MG capsule Take 300 mg by mouth 3 (three) times daily.  [provider]  glipiZIDE (GLUCOTROL XL) 10 MG 24 hr tablet Take 10 mg by mouth 2 (two) times daily.  08/02/15   [provider]  levothyroxine (SYNTHROID, LEVOTHROID) 50 MCG tablet Take 50 mcg by mouth daily before breakfast.  08/19/15   [provider]  metFORMIN (GLUCOPHAGE) 500 MG tablet Take 1,000 mg by mouth 2 (two) times daily with a meal.     [provider]  metFORMIN (GLUCOPHAGE-XR) 500 MG 24 hr tablet Take 1,000 mg by mouth 2 (two) times daily. 05/12/15 05/11/16  [provider]  Multiple Vitamin (MULTIVITAMIN WITH  MINERALS) TABS Take 1 tablet by mouth daily.    [provider]  naproxen sodium (ANAPROX) 220 MG tablet Take 220 mg by mouth 2 (two) times daily as needed (for pain).     [provider]  Probiotic Product (ALIGN PO) Take by mouth.    [provider]  rosuvastatin (CRESTOR) 10 MG tablet Take 10 mg by mouth every other day.    [provider]  traMADol (ULTRAM) 50 MG tablet Take 1 tablet (50 mg total) by mouth every 6 (six) hours as needed for moderate pain. 10/22/15   Tia Alert, MD  vitamin E 400 UNIT capsule Take 400 Units by mouth daily.    [provider]    Allergies Nickel, Atorvastatin, Ezetimibe-simvastatin, Pravastatin, Simvastatin, Amoxicillin, Codeine, Morphine and related, Oxycodone, Shellfish allergy, and Sulfa antibiotics  Family History  Problem Relation Age of Onset  . Breast cancer Maternal Aunt        50's  . Stroke Mother   . Stroke Father   . Cancer - Other Brother   . Breast cancer Cousin     Social History Social History   Tobacco Use  . Smoking status: Former Smoker    Years: 30.00    Types: Cigarettes  . Smokeless tobacco: Never Used  . Tobacco comment: quit  in 2000-2001  Substance Use Topics  . Alcohol use: No  . Drug use: No    Review of Systems  Constitutional: No fever/chills Eyes: No visual changes. ENT: No sore throat. Cardiovascular: Denies chest pain. Respiratory: Denies shortness of breath. Gastrointestinal: No abdominal pain.  No nausea, no vomiting.  No diarrhea.  No constipation. Genitourinary: Negative for dysuria. Musculoskeletal: Negative for back pain. Skin: Negative for rash. Neurological: Negative , focal weakness or numbness.  Positive for headache ____________________________________________   PHYSICAL EXAM:  VITAL SIGNS: ED Triage Vitals  Enc Vitals Group     BP 09/13/19 1428 113/75     Pulse Rate 09/13/19 1428 86     Resp 09/13/19 1428 18     Temp 09/13/19 1428  98.2 F (36.8 C)     Temp Source 09/13/19 1428 Oral     SpO2 09/13/19 1428 95 %     Weight 09/13/19 1428 154 lb (69.9 kg)     Height 09/13/19 1428  (1.6 m)     Head Circumference --      Peak Flow --      Pain Score 09/13/19 1442 1     Pain Loc --      Pain Edu? --      Excl. in GC? --      Constitutional: Alert and oriented. Well appearing and in no acute distress. Eyes: Conjunctivae are normal. PERRL. EOMI. Head: Golf ball-sized hematoma to her right occiput.  Hemostatic without discrete laceration requiring repair. Nose: No congestion/rhinnorhea. Mouth/Throat: Mucous membranes are moist.  Oropharynx non-erythematous. Neck: No stridor. No cervical spine tenderness to palpation. Cardiovascular: Normal rate, regular rhythm. Grossly normal heart sounds.  Good peripheral circulation. Respiratory: Normal respiratory effort.  No retractions. Lungs CTAB. Gastrointestinal: Soft , nondistended, nontender to palpation. No abdominal bruits. No CVA tenderness. Musculoskeletal: No lower extremity tenderness nor edema.  No joint effusions. No signs of acute trauma. Neurologic:  Normal speech and language. No gross focal neurologic deficits are appreciated. No gait instability noted. Cranial nerves II through XII intact Full strength and sensation to light touch in all 4 extremities. Skin:  Skin is warm, dry and intact. No rash noted. Psychiatric: Mood and affect are normal. Speech and behavior are normal.  ____________________________________________   LABS (all labs ordered are listed, but only abnormal results are displayed)  Labs Reviewed  PROTIME-INR - Abnormal; Notable for the following components:      Result Value   Prothrombin Time 11.2 (*)    All other components within normal limits  CBC - Abnormal; Notable for the following components:   WBC 11.5 (*)    All other components within normal limits  BASIC METABOLIC PANEL - Abnormal; Notable for the following components:    Glucose, Bld 169 (*)    BUN 32 (*)    Creatinine, Ser 1.28 (*)    GFR calc non Af Amer 39 (*)    GFR calc Af Amer 45 (*)    All other components within normal limits  APTT   ____________________________________________  12 Lead EKG   ____________________________________________  RADIOLOGY  ED MD interpretation: CT head with evidence of small subarachnoid hemorrhage in the left vertex.  Obtained due to concern for ICH due to fall on antiplatelet agent  Official radiology report(s): CT Head Wo Contrast  Addendum Date: 09/13/2019   ADDENDUM REPORT: 09/13/2019 15:44 ADDENDUM: These results were called by telephone at the time of interpretation on 09/13/2019 at 3:43 pm to provider Dr. Chesley Noonharles Jessup, who verbally acknowledged these results. Electronically Signed   By: Helyn NumbersAshesh  Parikh MD   On: 09/13/2019 15:44   Result Date: 09/13/2019 CLINICAL DATA:  Fall, head injury EXAM: CT HEAD WITHOUT CONTRAST CT CERVICAL SPINE WITHOUT CONTRAST TECHNIQUE: Multidetector CT imaging of the head and cervical spine was performed following the standard protocol without intravenous contrast. Multiplanar CT image reconstructions of the cervical spine were also generated. COMPARISON:  07/19/2018 FINDINGS: CT HEAD FINDINGS Brain: Normal anatomic configuration. Small focus of encephalomalacia within the right cerebellar hemisphere related to previous intraparenchymal hematoma, now resolved. A tiny focus of subarachnoid hemorrhage, however, is identified at the vertex adjacent to the left precentral sulcus, best noted on axial image # 24 in sagittal image # 32. No associated mass effect or midline shift. Moderate periventricular white matter changes are again identified most in keeping with the sequela of small vessel ischemia. Remote lacunar infarct noted within the right putamen. No abnormal intra or extra-axial mass lesion or fluid collection. No abnormal mass effect or midline shift. No evidence of acute infarct.  Ventricular size is normal. Cerebellum unremarkable. Vascular: Extensive atherosclerotic calcifications are noted within the carotid siphons bilaterally. No hyperdense vasculature at the skull base. Skull: Intact Sinuses/Orbits: Paranasal sinuses are clear. Orbits are unremarkable. Other: Mastoid air cells and middle ear cavities are clear. Moderate right parietal scalp hematoma is present. CT CERVICAL SPINE FINDINGS Alignment: Normal cervical lordosis.  No listhesis. Skull base and vertebrae: The craniocervical junction is unremarkable. Atlantal dental interval is normal. Vertebral body height has been preserved. No acute  fracture of the cervical spine. Multiple subchondral cysts are noted within the dens, likely degenerative in nature. Soft tissues and spinal canal: The spinal canal is widely patent. No definite canal hematoma identified. Extensive atherosclerotic calcification is noted within the carotid bifurcations bilaterally. Extensive calcifications are noted at the origin of the right subclavian artery. The paraspinal soft tissues are otherwise unremarkable. Disc levels: Sagittal reformats demonstrate intervertebral disc space narrowing and endplate remodeling throughout the cervical spine, most severe at C2-3 and C6-7 in keeping with changes of moderate to severe degenerative disc disease. Axial images demonstrate: C1-2: Mild degenerative change. C2-3: Small posterior disc osteophyte complex effaces the anterior canal space. Severe right uncovertebral arthrosis results in mild to moderate right neural foraminal narrowing. C3-4: Moderate left uncovertebral arthrosis and facet arthrosis results in moderate left neural foraminal narrowing. Mild right uncovertebral arthrosis results in mild right neural foraminal narrowing. No canal stenosis. C4-5: Severe right uncovertebral and facet arthrosis results in severe right neural foraminal narrowing. No significant canal stenosis. Mild left facet arthrosis without  significant neural foraminal narrowing. C5-6: Mild bilateral facet arthrosis. No significant neural foraminal narrowing. Mild posterior disc herniation effaces the anterior canal space without significant canal stenosis. C6-7: Severe right facet arthrosis and moderate right uncovertebral arthrosis results in moderate right neural foraminal narrowing. Moderate left uncovertebral arthrosis and mild facet arthrosis results in moderate left neural foraminal narrowing. No significant canal stenosis. C7-T1: Mild right facet arthrosis. No significant neural foraminal narrowing or canal stenosis. Upper chest: The visualized lung apices are clear. Other: None significant IMPRESSION: Moderate right parietal scalp hematoma. Tiny focus of subarachnoid hemorrhage at the left vertex. No associated abnormal mass effect or midline shift. No calvarial fracture No acute cervical spine fracture or listhesis. Multilevel cervical degenerative disc and degenerative joint disease with multilevel neural foraminal narrowing as described above. Electronically Signed: By: Helyn Numbers MD On: 09/13/2019 15:39   CT Cervical Spine Wo Contrast  Addendum Date: 09/13/2019   ADDENDUM REPORT: 09/13/2019 15:44 ADDENDUM: These results were called by telephone at the time of interpretation on 09/13/2019 at 3:43 pm to provider Dr. Chesley Noon, who verbally acknowledged these results. Electronically Signed   By: Helyn Numbers MD   On: 09/13/2019 15:44   Result Date: 09/13/2019 CLINICAL DATA:  Fall, head injury EXAM: CT HEAD WITHOUT CONTRAST CT CERVICAL SPINE WITHOUT CONTRAST TECHNIQUE: Multidetector CT imaging of the head and cervical spine was performed following the standard protocol without intravenous contrast. Multiplanar CT image reconstructions of the cervical spine were also generated. COMPARISON:  07/19/2018 FINDINGS: CT HEAD FINDINGS Brain: Normal anatomic configuration. Small focus of encephalomalacia within the right cerebellar  hemisphere related to previous intraparenchymal hematoma, now resolved. A tiny focus of subarachnoid hemorrhage, however, is identified at the vertex adjacent to the left precentral sulcus, best noted on axial image # 24 in sagittal image # 32. No associated mass effect or midline shift. Moderate periventricular white matter changes are again identified most in keeping with the sequela of small vessel ischemia. Remote lacunar infarct noted within the right putamen. No abnormal intra or extra-axial mass lesion or fluid collection. No abnormal mass effect or midline shift. No evidence of acute infarct. Ventricular size is normal. Cerebellum unremarkable. Vascular: Extensive atherosclerotic calcifications are noted within the carotid siphons bilaterally. No hyperdense vasculature at the skull base. Skull: Intact Sinuses/Orbits: Paranasal sinuses are clear. Orbits are unremarkable. Other: Mastoid air cells and middle ear cavities are clear. Moderate right parietal scalp hematoma is present. CT CERVICAL  SPINE FINDINGS Alignment: Normal cervical lordosis.  No listhesis. Skull base and vertebrae: The craniocervical junction is unremarkable. Atlantal dental interval is normal. Vertebral body height has been preserved. No acute fracture of the cervical spine. Multiple subchondral cysts are noted within the dens, likely degenerative in nature. Soft tissues and spinal canal: The spinal canal is widely patent. No definite canal hematoma identified. Extensive atherosclerotic calcification is noted within the carotid bifurcations bilaterally. Extensive calcifications are noted at the origin of the right subclavian artery. The paraspinal soft tissues are otherwise unremarkable. Disc levels: Sagittal reformats demonstrate intervertebral disc space narrowing and endplate remodeling throughout the cervical spine, most severe at C2-3 and C6-7 in keeping with changes of moderate to severe degenerative disc disease. Axial images  demonstrate: C1-2: Mild degenerative change. C2-3: Small posterior disc osteophyte complex effaces the anterior canal space. Severe right uncovertebral arthrosis results in mild to moderate right neural foraminal narrowing. C3-4: Moderate left uncovertebral arthrosis and facet arthrosis results in moderate left neural foraminal narrowing. Mild right uncovertebral arthrosis results in mild right neural foraminal narrowing. No canal stenosis. C4-5: Severe right uncovertebral and facet arthrosis results in severe right neural foraminal narrowing. No significant canal stenosis. Mild left facet arthrosis without significant neural foraminal narrowing. C5-6: Mild bilateral facet arthrosis. No significant neural foraminal narrowing. Mild posterior disc herniation effaces the anterior canal space without significant canal stenosis. C6-7: Severe right facet arthrosis and moderate right uncovertebral arthrosis results in moderate right neural foraminal narrowing. Moderate left uncovertebral arthrosis and mild facet arthrosis results in moderate left neural foraminal narrowing. No significant canal stenosis. C7-T1: Mild right facet arthrosis. No significant neural foraminal narrowing or canal stenosis. Upper chest: The visualized lung apices are clear. Other: None significant IMPRESSION: Moderate right parietal scalp hematoma. Tiny focus of subarachnoid hemorrhage at the left vertex. No associated abnormal mass effect or midline shift. No calvarial fracture No acute cervical spine fracture or listhesis. Multilevel cervical degenerative disc and degenerative joint disease with multilevel neural foraminal narrowing as described above. Electronically Signed: By: Helyn Numbers MD On: 09/13/2019 15:39    ____________________________________________   PROCEDURES  Procedure(s) performed (including Critical Care):  Procedures  CRITICAL CARE Performed by: Delton Prairie   Total critical care time: 40 minutes  Critical  care time was exclusive of separately billable procedures and treating other patients.  Critical care was necessary to treat or prevent imminent or life-threatening deterioration.  Critical care was time spent personally by me on the following activities: development of treatment plan with patient and/or surrogate as well as nursing, discussions with consultants, evaluation of patient's response to treatment, examination of patient, obtaining history from patient or surrogate, ordering and performing treatments and interventions, ordering and review of laboratory studies, ordering and review of radiographic studies, pulse oximetry and re-evaluation of patient's condition.  ____________________________________________   INITIAL IMPRESSION / ASSESSMENT AND PLAN / ED COURSE  Pleasant and functional 85 year old woman on aspirin presenting after mechanical fall, causing a small SAH requiring observation and repeat imaging in the ED prior to likely discharge home if no enlargement.  Patient well-appearing and has no neurologic deficits.  She reports a mechanical fall coming down her sister-in-law's steps at her home.  She does have a hematoma to her right occiput, but no laceration requiring repair.  Unremarkable blood work and coagulation panel.  CT head demonstrates demonstrate a small foci of subarachnoid hemorrhage.  Neurosurgery consulted, and recommends blood pressure management and observation, with repeat imaging after 8 hours to assess  for interval change.  BP requiring multiple doses of hydralazine administration.  Patient continues to feel well and denies complaints throughout her observation.  Patient signed out to oncoming provider to facilitate repeat CT head.  If the CT head does not demonstrate an increase in size, patient will go home with cessation of aspirin.   Clinical Course as of Sep 12 2208  Thu Sep 13, 2019  1606 Patient assessed and reassuring neurologic exam.   [DS]  1618 Called  Dr. Myer Haff, NSGY on call, no answer.  I left a voicemail with my information   [DS]  [phone] Spoke with Dr. Myer Haff, explained patient presentation and workup, he recommends 6hrs repeat CT head and if no change or increase in size, she would be okay with discharge and outpatient management with holding of her aspirin.   [DS]  1635 He reports that he will be by to see the patient in person soon   [DS]    Clinical Course User Index [DS] Delton Prairie, MD   ____________________________________________   FINAL CLINICAL IMPRESSION(S) / ED DIAGNOSES  Final diagnoses:  SAH (subarachnoid hemorrhage) (HCC)  Injury of head, initial encounter  Fall, initial encounter  Scalp hematoma, initial encounter     ED Discharge Orders    None      Latorie Montesano Katrinka Blazing   Note:  This document was prepared using Dragon voice recognition software and may include unintentional dictation errors.   Delton Prairie, MD 09/13/19 2212

## 2019-09-13 NOTE — Discharge Instructions (Addendum)
Please stop taking your aspirin altogether.  I would recommend you have a conversation with your primary care physician about the utility of continued blood thinning agents versus the risk of bleeding, such as this event.

## 2019-09-13 NOTE — ED Provider Notes (Signed)
-----------------------------------------   10:27 PM on 09/13/2019 -----------------------------------------  Blood pressure (!) 161/54, pulse 86, temperature 98.2 F (36.8 C), temperature source Oral, resp. rate 16, height 5\' 3"  (1.6 m), weight 69.9 kg, SpO2 96 %.  Assuming care from Dr. .  In short, Paula Hansen is a 85 y.o. female with a chief complaint of Head Injury and Fall .  Refer to the original H&P for additional details.  The current plan of care is to follow-up repeat head CT results, if stable compared to prior patient may be appropriate for discharge home.  ----------------------------------------- 11:01 PM on 09/13/2019 -----------------------------------------  Repeat CT head is stable with no worsening of tiny subarachnoid hemorrhage or scalp hematoma.  Patient remains neurologically intact on reassessment with mild headache, but otherwise asymptomatic.  She was briefly placed on 2 L nasal cannula here, but has now been successfully weaned off and denies any respiratory symptoms.  Blood pressure continues to be improved and she is appropriate for discharge home.  I have counseled her to continue taking her blood pressure medications and schedule follow-up with her neurologist at [facility].  Patient again advised to stop taking aspirin and to return to the ED for new or worsening symptoms.  Patient agrees with plan.    [facility], MD 09/13/19 [phone]

## 2019-09-13 NOTE — ED Notes (Signed)
Pt provided w/ turkey sandwich box.  

## 2019-09-17 ENCOUNTER — Other Ambulatory Visit: Payer: Self-pay | Admitting: Nurse Practitioner

## 2019-09-17 DIAGNOSIS — I609 Nontraumatic subarachnoid hemorrhage, unspecified: Secondary | ICD-10-CM

## 2019-10-09 ENCOUNTER — Ambulatory Visit
Admission: RE | Admit: 2019-10-09 | Discharge: 2019-10-09 | Disposition: A | Discharge status: Home / Self Care | Payer: Medicare Other | Source: Ambulatory Visit | Attending: Nurse Practitioner | Admitting: Nurse Practitioner

## 2019-10-09 ENCOUNTER — Other Ambulatory Visit: Payer: Self-pay

## 2019-10-09 DIAGNOSIS — I609 Nontraumatic subarachnoid hemorrhage, unspecified: Secondary | ICD-10-CM | POA: Diagnosis not present

## 2019-11-13 ENCOUNTER — Other Ambulatory Visit: Payer: Self-pay | Admitting: Internal Medicine

## 2019-11-13 DIAGNOSIS — Z1231 Encounter for screening mammogram for malignant neoplasm of breast: Secondary | ICD-10-CM

## 2019-12-14 ENCOUNTER — Other Ambulatory Visit: Payer: Self-pay

## 2019-12-14 ENCOUNTER — Ambulatory Visit
Admission: RE | Admit: 2019-12-14 | Discharge: 2019-12-14 | Disposition: A | Discharge status: Home / Self Care | Payer: Medicare Other | Source: Ambulatory Visit | Attending: Internal Medicine | Admitting: Internal Medicine

## 2019-12-14 DIAGNOSIS — Z1231 Encounter for screening mammogram for malignant neoplasm of breast: Secondary | ICD-10-CM | POA: Diagnosis present

## 2020-05-28 DIAGNOSIS — E1142 Type 2 diabetes mellitus with diabetic polyneuropathy: Secondary | ICD-10-CM | POA: Insufficient documentation

## 2020-10-16 IMAGING — CT CT CERVICAL SPINE W/O CM
3 of 4 series · 9 of 33 positions shown, 10 images · non-contrast
Comparison: 07/19/2018
COMPARISON: 07/19/2018

Addendum:
CLINICAL DATA: Fall, head injury

EXAM:
CT HEAD WITHOUT CONTRAST
CT CERVICAL SPINE WITHOUT CONTRAST
TECHNIQUE: Multidetector CT imaging of the head and cervical spine was
performed following the standard protocol without intravenous
contrast. Multiplanar CT image reconstructions of the cervical spine
were also generated.

[Series 4: sagittal bone · sagittal · 0.26mm/px · 5 of 51 slices shown]
[im 17/51  bone]
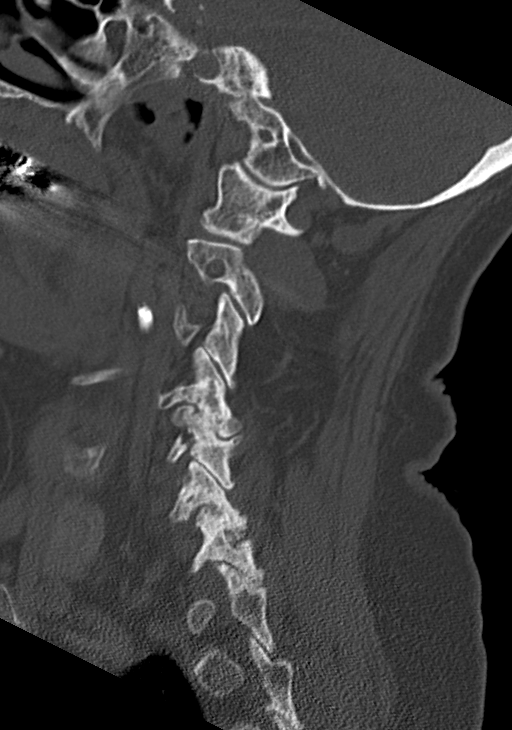
[im 21/51  bone]
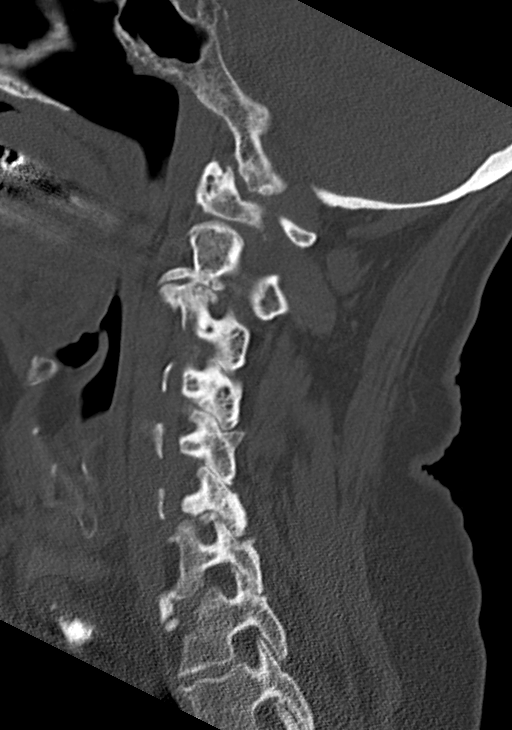
[im 26/51  bone]
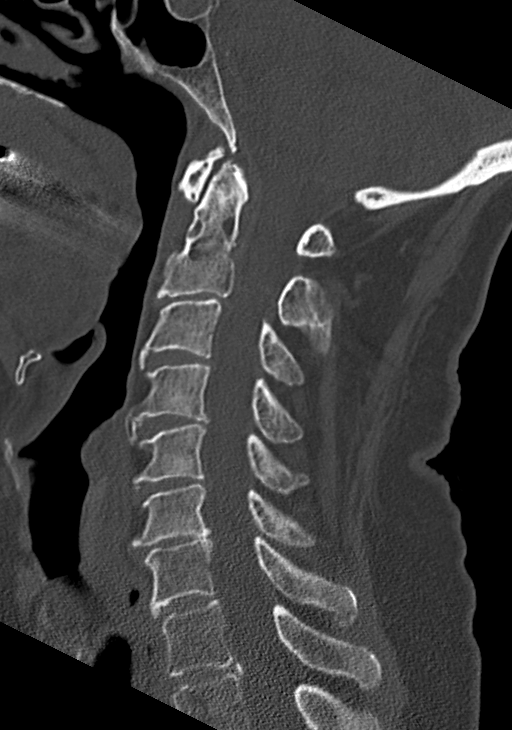
[im 30/51  bone]
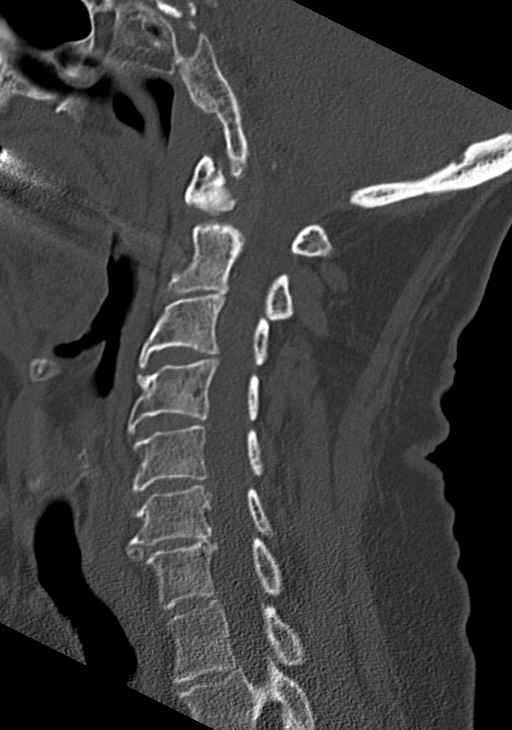
[im 34/51  bone]
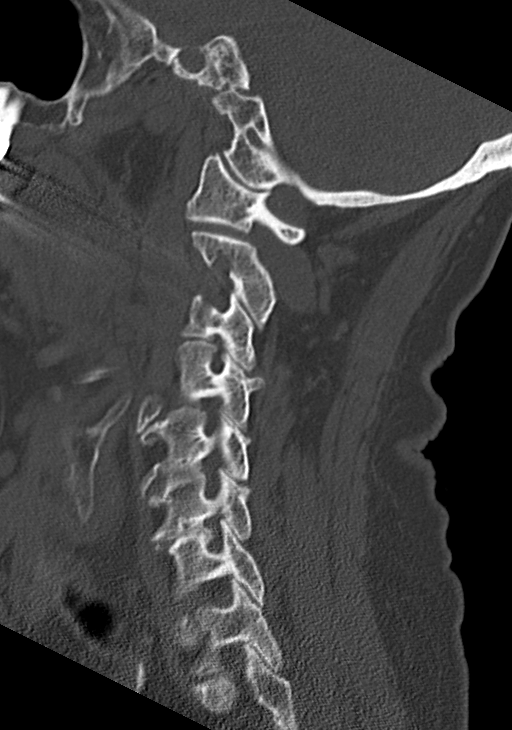

[Series 5: coronal bone · coronal · 0.21mm/px · 3 of 40 slices shown]
[im 8/40  bone]
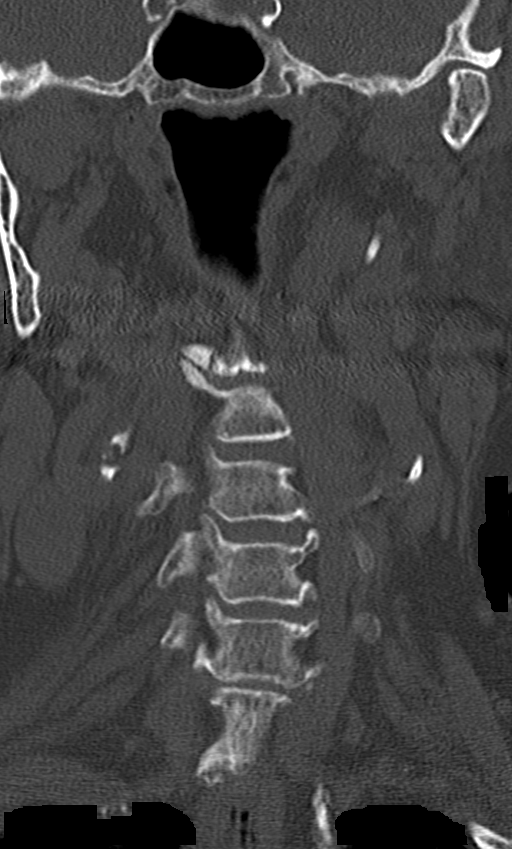
[im 16/40  bone]
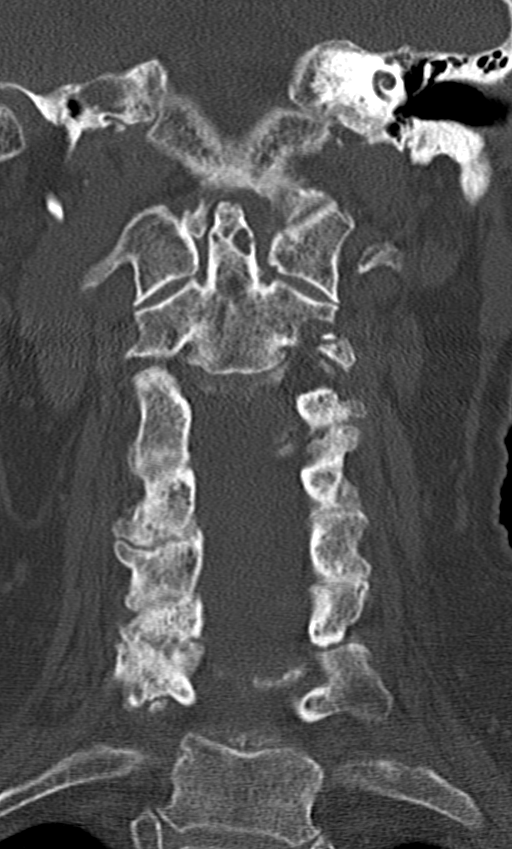
[im 24/40  bone]
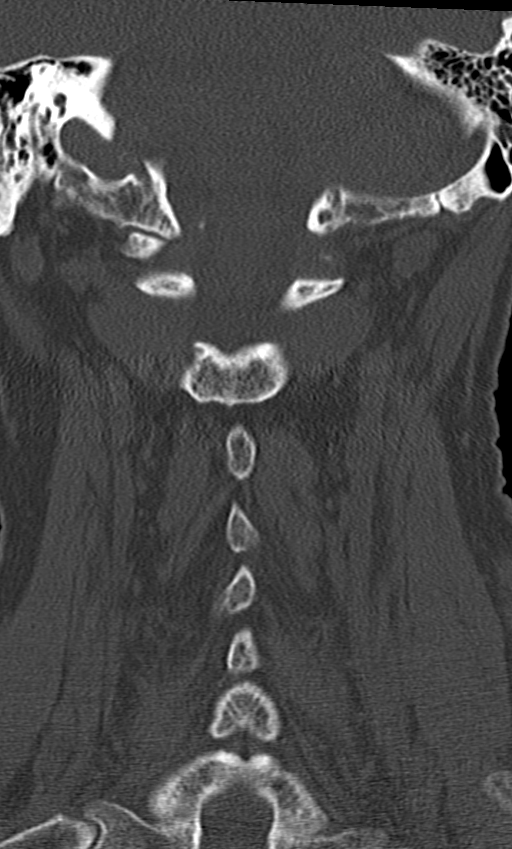

[Series 6: orthogonal bone · axial · 0.23mm/px · z∈[+167,+167]mm · 1 of 87 slices shown, 2 images]
[im 44/87  soft-tissue]
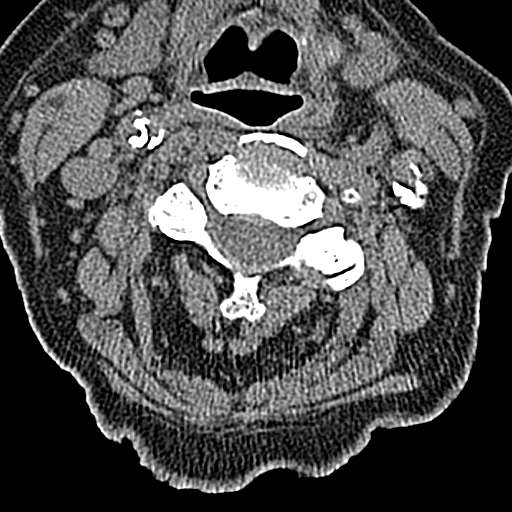
[im 44/87  bone]
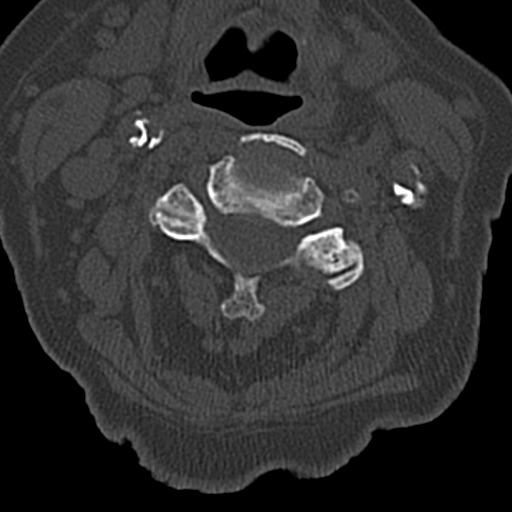

[9 of 33 positions shown; findings below may reference images not displayed]

FINDINGS: CT HEAD FINDINGS

Brain: Normal anatomic configuration. Small focus of
encephalomalacia within the right cerebellar hemisphere related to
previous intraparenchymal hematoma, now resolved. A tiny focus of
subarachnoid hemorrhage, however, is identified at the vertex
adjacent to the left precentral sulcus, best noted on axial image #
24 in sagittal image # 32. No associated mass effect or midline
shift. Moderate periventricular white matter changes are again
identified most in keeping with the sequela of small vessel
ischemia. Remote lacunar infarct noted within the right putamen.

No abnormal intra or extra-axial mass lesion or fluid collection. No
abnormal mass effect or midline shift. No evidence of acute infarct.
Ventricular size is normal. Cerebellum unremarkable.

Vascular: Extensive atherosclerotic calcifications are noted within
the carotid siphons bilaterally. No hyperdense vasculature at the
skull base.

Skull: Intact

Sinuses/Orbits: Paranasal sinuses are clear. Orbits are
unremarkable.

Other: Mastoid air cells and middle ear cavities are clear. Moderate
right parietal scalp hematoma is present.

CT CERVICAL SPINE FINDINGS

Alignment: Normal cervical lordosis.  No listhesis.

Skull base and vertebrae: The craniocervical junction is
unremarkable. Atlantal dental interval is normal. Vertebral body
height has been preserved. No acute fracture of the cervical spine.
Multiple subchondral cysts are noted within the dens, likely
degenerative in nature.

Soft tissues and spinal canal: The spinal canal is widely patent. No
definite canal hematoma identified. Extensive atherosclerotic
calcification is noted within the carotid bifurcations bilaterally.
Extensive calcifications are noted at the origin of the right
subclavian artery. The paraspinal soft tissues are otherwise
unremarkable.

Disc levels:

Sagittal reformats demonstrate intervertebral disc space narrowing
and endplate remodeling throughout the cervical spine, most severe
at C2-3 and C6-7 in keeping with changes of moderate to severe
degenerative disc disease. Axial images demonstrate:

C1-2: Mild degenerative change.

C2-3: Small posterior disc osteophyte complex effaces the anterior
canal space. Severe right uncovertebral arthrosis results in mild to
moderate right neural foraminal narrowing.

C3-4: Moderate left uncovertebral arthrosis and facet arthrosis
results in moderate left neural foraminal narrowing. Mild right
uncovertebral arthrosis results in mild right neural foraminal
narrowing. No canal stenosis.

C4-5: Severe right uncovertebral and facet arthrosis results in
severe right neural foraminal narrowing. No significant canal
stenosis. Mild left facet arthrosis without significant neural
foraminal narrowing.

C5-6: Mild bilateral facet arthrosis. No significant neural
foraminal narrowing. Mild posterior disc herniation effaces the
anterior canal space without significant canal stenosis.

C6-7: Severe right facet arthrosis and moderate right uncovertebral
arthrosis results in moderate right neural foraminal narrowing.
Moderate left uncovertebral arthrosis and mild facet arthrosis
results in moderate left neural foraminal narrowing. No significant
canal stenosis.

C7-T1: Mild right facet arthrosis. No significant neural foraminal
narrowing or canal stenosis.

Upper chest: The visualized lung apices are clear.

Other: None significant
IMPRESSION: Moderate right parietal scalp hematoma. Tiny focus of subarachnoid
hemorrhage at the left vertex. No associated abnormal mass effect or
midline shift.

No calvarial fracture

No acute cervical spine fracture or listhesis.

Multilevel cervical degenerative disc and degenerative joint disease
with multilevel neural foraminal narrowing as described above.

ADDENDUM:
These results were called by telephone at the time of interpretation
on 09/13/2019 at [DATE] to provider Dr. Liberty Casimiro, who verbally
acknowledged these results.

*** End of Addendum ***
FINDINGS: CT HEAD FINDINGS

Brain: Normal anatomic configuration. Small focus of
encephalomalacia within the right cerebellar hemisphere related to
previous intraparenchymal hematoma, now resolved. A tiny focus of
subarachnoid hemorrhage, however, is identified at the vertex
adjacent to the left precentral sulcus, best noted on axial image #
24 in sagittal image # 32. No associated mass effect or midline
shift. Moderate periventricular white matter changes are again
identified most in keeping with the sequela of small vessel
ischemia. Remote lacunar infarct noted within the right putamen.

No abnormal intra or extra-axial mass lesion or fluid collection. No
abnormal mass effect or midline shift. No evidence of acute infarct.
Ventricular size is normal. Cerebellum unremarkable.

Vascular: Extensive atherosclerotic calcifications are noted within
the carotid siphons bilaterally. No hyperdense vasculature at the
skull base.

Skull: Intact

Sinuses/Orbits: Paranasal sinuses are clear. Orbits are
unremarkable.

Other: Mastoid air cells and middle ear cavities are clear. Moderate
right parietal scalp hematoma is present.

CT CERVICAL SPINE FINDINGS

Alignment: Normal cervical lordosis.  No listhesis.

Skull base and vertebrae: The craniocervical junction is
unremarkable. Atlantal dental interval is normal. Vertebral body
height has been preserved. No acute fracture of the cervical spine.
Multiple subchondral cysts are noted within the dens, likely
degenerative in nature.

Soft tissues and spinal canal: The spinal canal is widely patent. No
definite canal hematoma identified. Extensive atherosclerotic
calcification is noted within the carotid bifurcations bilaterally.
Extensive calcifications are noted at the origin of the right
subclavian artery. The paraspinal soft tissues are otherwise
unremarkable.

Disc levels:

Sagittal reformats demonstrate intervertebral disc space narrowing
and endplate remodeling throughout the cervical spine, most severe
at C2-3 and C6-7 in keeping with changes of moderate to severe
degenerative disc disease. Axial images demonstrate:

C1-2: Mild degenerative change.

C2-3: Small posterior disc osteophyte complex effaces the anterior
canal space. Severe right uncovertebral arthrosis results in mild to
moderate right neural foraminal narrowing.

C3-4: Moderate left uncovertebral arthrosis and facet arthrosis
results in moderate left neural foraminal narrowing. Mild right
uncovertebral arthrosis results in mild right neural foraminal
narrowing. No canal stenosis.

C4-5: Severe right uncovertebral and facet arthrosis results in
severe right neural foraminal narrowing. No significant canal
stenosis. Mild left facet arthrosis without significant neural
foraminal narrowing.

C5-6: Mild bilateral facet arthrosis. No significant neural
foraminal narrowing. Mild posterior disc herniation effaces the
anterior canal space without significant canal stenosis.

C6-7: Severe right facet arthrosis and moderate right uncovertebral
arthrosis results in moderate right neural foraminal narrowing.
Moderate left uncovertebral arthrosis and mild facet arthrosis
results in moderate left neural foraminal narrowing. No significant
canal stenosis.

C7-T1: Mild right facet arthrosis. No significant neural foraminal
narrowing or canal stenosis.

Upper chest: The visualized lung apices are clear.

Other: None significant
IMPRESSION: Moderate right parietal scalp hematoma. Tiny focus of subarachnoid
hemorrhage at the left vertex. No associated abnormal mass effect or
midline shift.

No calvarial fracture

No acute cervical spine fracture or listhesis.

Multilevel cervical degenerative disc and degenerative joint disease
with multilevel neural foraminal narrowing as described above.

## 2020-10-16 IMAGING — CT CT HEAD W/O CM
2 of 3 series · 11 of 46 positions shown, 13 images · non-contrast
Comparison: 07/19/2018
COMPARISON: 07/19/2018

Addendum:
CLINICAL DATA: Fall, head injury

EXAM:
CT HEAD WITHOUT CONTRAST
CT CERVICAL SPINE WITHOUT CONTRAST
TECHNIQUE: Multidetector CT imaging of the head and cervical spine was
performed following the standard protocol without intravenous
contrast. Multiplanar CT image reconstructions of the cervical spine
were also generated.

[Series 2: head wo · axial · 0.43mm/px · z∈[+267,+387]mm · 8 of 29 slices shown, 10 images]
[im 3/29  brain]
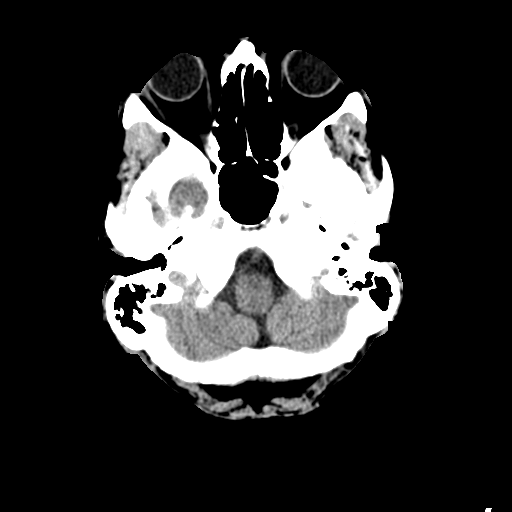
[im 3/29  bone]
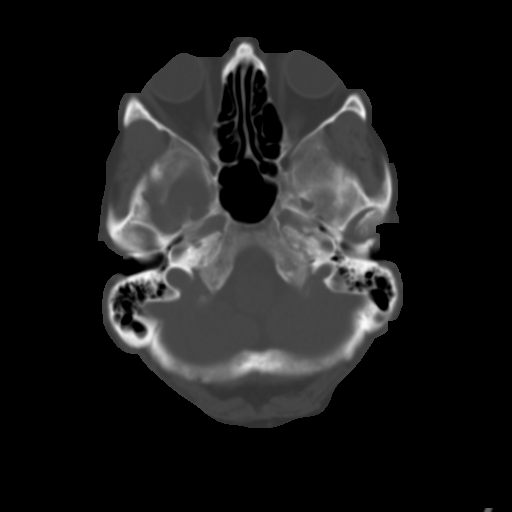
[im 7/29  brain]
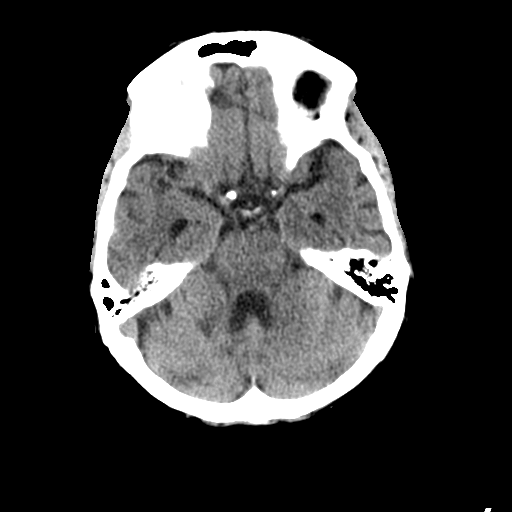
[im 10/29  brain]
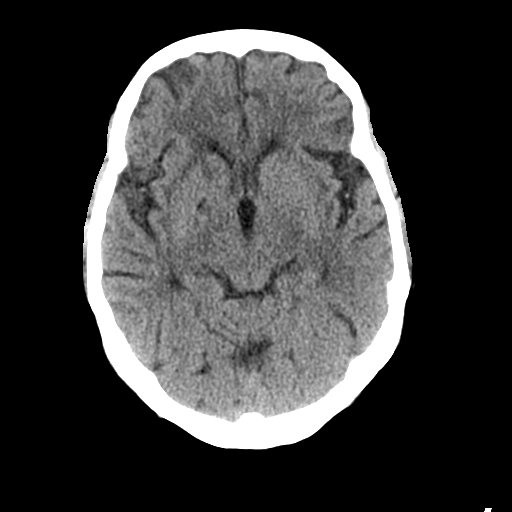
[im 13/29  brain]
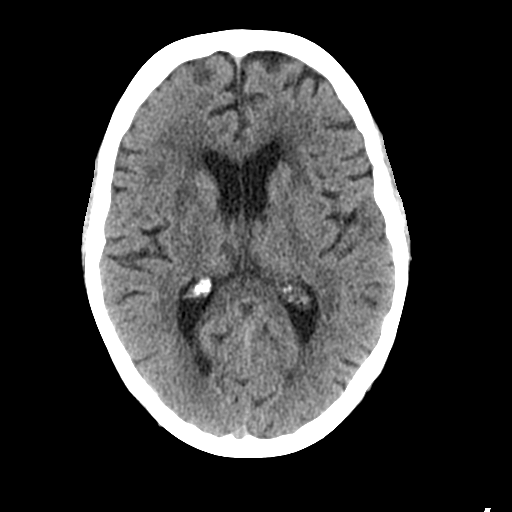
[im 17/29  brain]
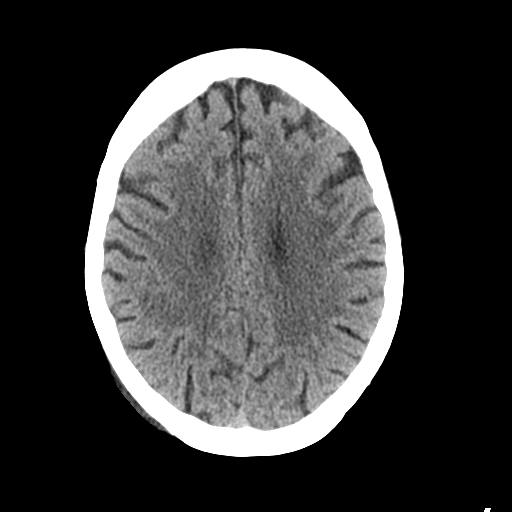
[im 17/29  bone]
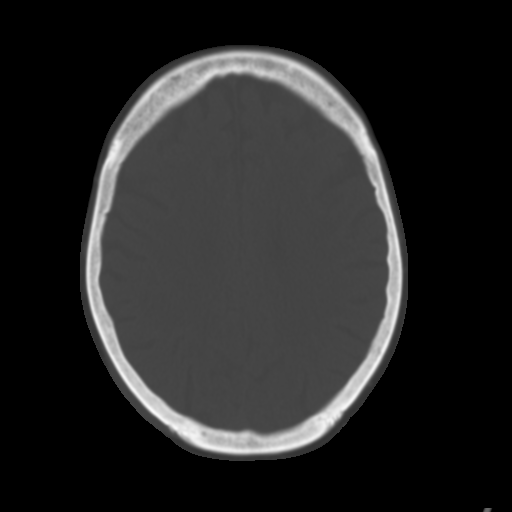
[im 20/29  brain]
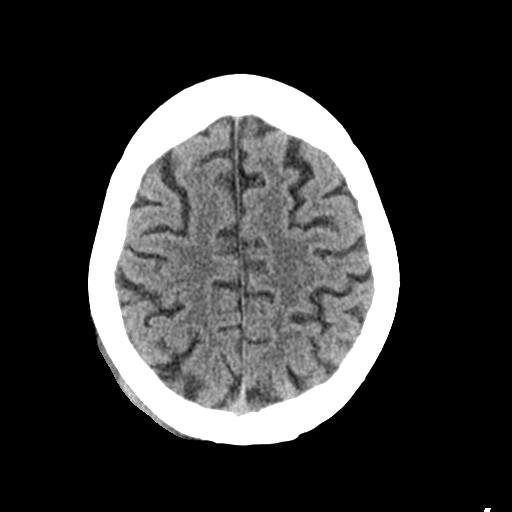
[im 23/29  brain]
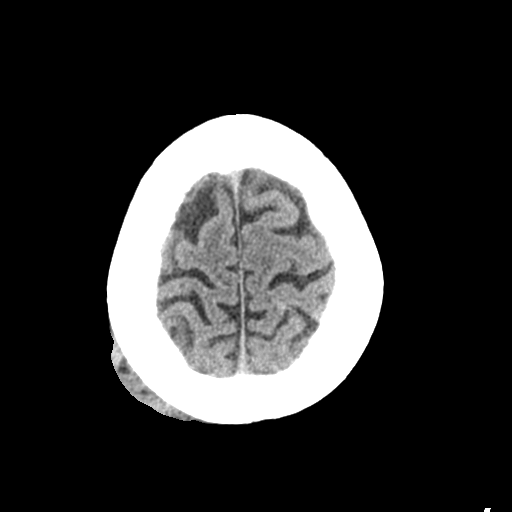
[im 27/29  brain]
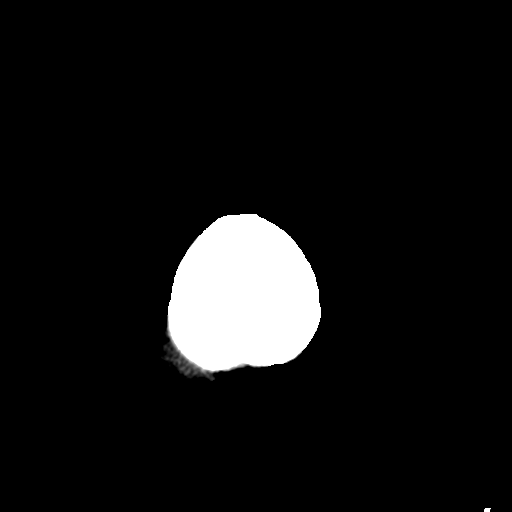

[Series 4: coronal soft tissue · coronal · 0.29mm/px · 3 of 61 slices shown]
[im 21/61  brain]
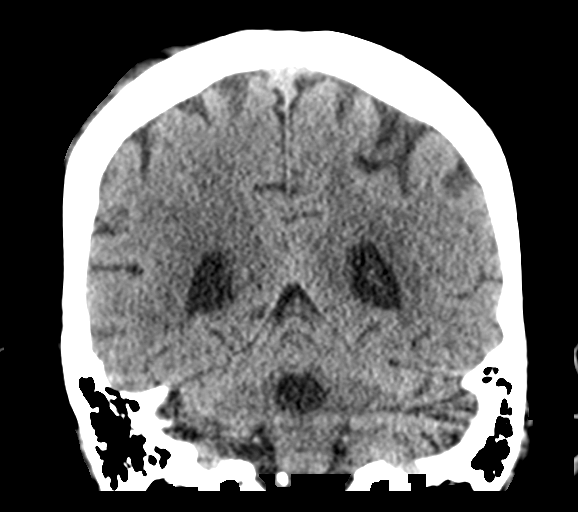
[im 27/61  brain]
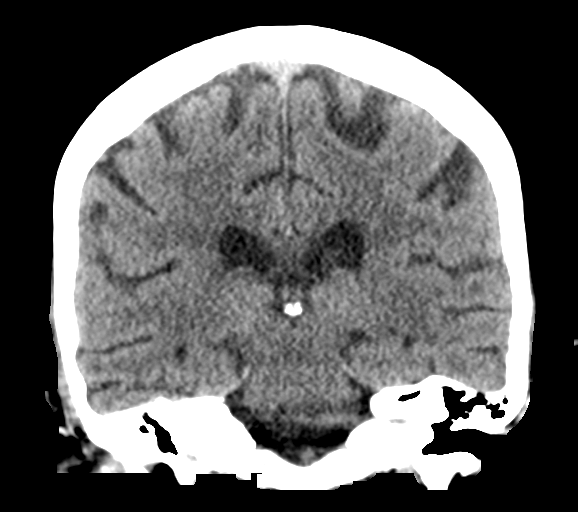
[im 34/61  brain]
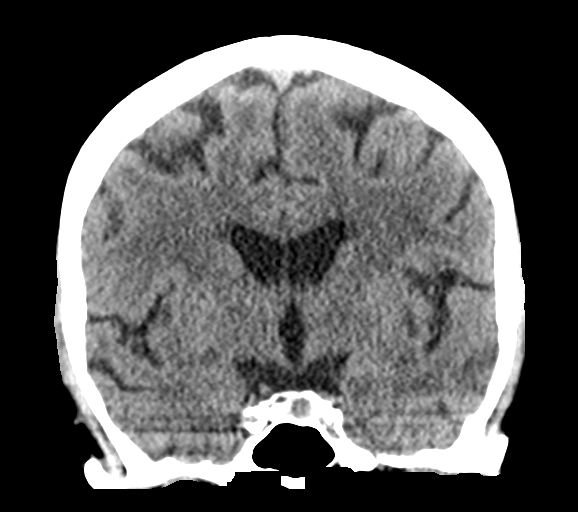

[11 of 46 positions shown; findings below may reference images not displayed]

FINDINGS: CT HEAD FINDINGS

Brain: Normal anatomic configuration. Small focus of
encephalomalacia within the right cerebellar hemisphere related to
previous intraparenchymal hematoma, now resolved. A tiny focus of
subarachnoid hemorrhage, however, is identified at the vertex
adjacent to the left precentral sulcus, best noted on axial image #
24 in sagittal image # 32. No associated mass effect or midline
shift. Moderate periventricular white matter changes are again
identified most in keeping with the sequela of small vessel
ischemia. Remote lacunar infarct noted within the right putamen.

No abnormal intra or extra-axial mass lesion or fluid collection. No
abnormal mass effect or midline shift. No evidence of acute infarct.
Ventricular size is normal. Cerebellum unremarkable.

Vascular: Extensive atherosclerotic calcifications are noted within
the carotid siphons bilaterally. No hyperdense vasculature at the
skull base.

Skull: Intact

Sinuses/Orbits: Paranasal sinuses are clear. Orbits are
unremarkable.

Other: Mastoid air cells and middle ear cavities are clear. Moderate
right parietal scalp hematoma is present.

CT CERVICAL SPINE FINDINGS

Alignment: Normal cervical lordosis.  No listhesis.

Skull base and vertebrae: The craniocervical junction is
unremarkable. Atlantal dental interval is normal. Vertebral body
height has been preserved. No acute fracture of the cervical spine.
Multiple subchondral cysts are noted within the dens, likely
degenerative in nature.

Soft tissues and spinal canal: The spinal canal is widely patent. No
definite canal hematoma identified. Extensive atherosclerotic
calcification is noted within the carotid bifurcations bilaterally.
Extensive calcifications are noted at the origin of the right
subclavian artery. The paraspinal soft tissues are otherwise
unremarkable.

Disc levels:

Sagittal reformats demonstrate intervertebral disc space narrowing
and endplate remodeling throughout the cervical spine, most severe
at C2-3 and C6-7 in keeping with changes of moderate to severe
degenerative disc disease. Axial images demonstrate:

C1-2: Mild degenerative change.

C2-3: Small posterior disc osteophyte complex effaces the anterior
canal space. Severe right uncovertebral arthrosis results in mild to
moderate right neural foraminal narrowing.

C3-4: Moderate left uncovertebral arthrosis and facet arthrosis
results in moderate left neural foraminal narrowing. Mild right
uncovertebral arthrosis results in mild right neural foraminal
narrowing. No canal stenosis.

C4-5: Severe right uncovertebral and facet arthrosis results in
severe right neural foraminal narrowing. No significant canal
stenosis. Mild left facet arthrosis without significant neural
foraminal narrowing.

C5-6: Mild bilateral facet arthrosis. No significant neural
foraminal narrowing. Mild posterior disc herniation effaces the
anterior canal space without significant canal stenosis.

C6-7: Severe right facet arthrosis and moderate right uncovertebral
arthrosis results in moderate right neural foraminal narrowing.
Moderate left uncovertebral arthrosis and mild facet arthrosis
results in moderate left neural foraminal narrowing. No significant
canal stenosis.

C7-T1: Mild right facet arthrosis. No significant neural foraminal
narrowing or canal stenosis.

Upper chest: The visualized lung apices are clear.

Other: None significant
IMPRESSION: Moderate right parietal scalp hematoma. Tiny focus of subarachnoid
hemorrhage at the left vertex. No associated abnormal mass effect or
midline shift.

No calvarial fracture

No acute cervical spine fracture or listhesis.

Multilevel cervical degenerative disc and degenerative joint disease
with multilevel neural foraminal narrowing as described above.

ADDENDUM:
These results were called by telephone at the time of interpretation
on 09/13/2019 at [DATE] to provider Dr. Liberty Casimiro, who verbally
acknowledged these results.

*** End of Addendum ***
FINDINGS: CT HEAD FINDINGS

Brain: Normal anatomic configuration. Small focus of
encephalomalacia within the right cerebellar hemisphere related to
previous intraparenchymal hematoma, now resolved. A tiny focus of
subarachnoid hemorrhage, however, is identified at the vertex
adjacent to the left precentral sulcus, best noted on axial image #
24 in sagittal image # 32. No associated mass effect or midline
shift. Moderate periventricular white matter changes are again
identified most in keeping with the sequela of small vessel
ischemia. Remote lacunar infarct noted within the right putamen.

No abnormal intra or extra-axial mass lesion or fluid collection. No
abnormal mass effect or midline shift. No evidence of acute infarct.
Ventricular size is normal. Cerebellum unremarkable.

Vascular: Extensive atherosclerotic calcifications are noted within
the carotid siphons bilaterally. No hyperdense vasculature at the
skull base.

Skull: Intact

Sinuses/Orbits: Paranasal sinuses are clear. Orbits are
unremarkable.

Other: Mastoid air cells and middle ear cavities are clear. Moderate
right parietal scalp hematoma is present.

CT CERVICAL SPINE FINDINGS

Alignment: Normal cervical lordosis.  No listhesis.

Skull base and vertebrae: The craniocervical junction is
unremarkable. Atlantal dental interval is normal. Vertebral body
height has been preserved. No acute fracture of the cervical spine.
Multiple subchondral cysts are noted within the dens, likely
degenerative in nature.

Soft tissues and spinal canal: The spinal canal is widely patent. No
definite canal hematoma identified. Extensive atherosclerotic
calcification is noted within the carotid bifurcations bilaterally.
Extensive calcifications are noted at the origin of the right
subclavian artery. The paraspinal soft tissues are otherwise
unremarkable.

Disc levels:

Sagittal reformats demonstrate intervertebral disc space narrowing
and endplate remodeling throughout the cervical spine, most severe
at C2-3 and C6-7 in keeping with changes of moderate to severe
degenerative disc disease. Axial images demonstrate:

C1-2: Mild degenerative change.

C2-3: Small posterior disc osteophyte complex effaces the anterior
canal space. Severe right uncovertebral arthrosis results in mild to
moderate right neural foraminal narrowing.

C3-4: Moderate left uncovertebral arthrosis and facet arthrosis
results in moderate left neural foraminal narrowing. Mild right
uncovertebral arthrosis results in mild right neural foraminal
narrowing. No canal stenosis.

C4-5: Severe right uncovertebral and facet arthrosis results in
severe right neural foraminal narrowing. No significant canal
stenosis. Mild left facet arthrosis without significant neural
foraminal narrowing.

C5-6: Mild bilateral facet arthrosis. No significant neural
foraminal narrowing. Mild posterior disc herniation effaces the
anterior canal space without significant canal stenosis.

C6-7: Severe right facet arthrosis and moderate right uncovertebral
arthrosis results in moderate right neural foraminal narrowing.
Moderate left uncovertebral arthrosis and mild facet arthrosis
results in moderate left neural foraminal narrowing. No significant
canal stenosis.

C7-T1: Mild right facet arthrosis. No significant neural foraminal
narrowing or canal stenosis.

Upper chest: The visualized lung apices are clear.

Other: None significant
IMPRESSION: Moderate right parietal scalp hematoma. Tiny focus of subarachnoid
hemorrhage at the left vertex. No associated abnormal mass effect or
midline shift.

No calvarial fracture

No acute cervical spine fracture or listhesis.

Multilevel cervical degenerative disc and degenerative joint disease
with multilevel neural foraminal narrowing as described above.

## 2021-01-05 ENCOUNTER — Other Ambulatory Visit: Payer: Self-pay | Admitting: Internal Medicine

## 2021-01-05 DIAGNOSIS — Z1231 Encounter for screening mammogram for malignant neoplasm of breast: Secondary | ICD-10-CM

## 2021-06-02 ENCOUNTER — Ambulatory Visit
Admission: RE | Admit: 2021-06-02 | Discharge: 2021-06-02 | Disposition: A | Discharge status: Home / Self Care | Payer: Medicare Other | Source: Ambulatory Visit | Attending: Internal Medicine | Admitting: Internal Medicine

## 2021-06-02 DIAGNOSIS — Z1231 Encounter for screening mammogram for malignant neoplasm of breast: Secondary | ICD-10-CM | POA: Diagnosis present

## 2021-06-11 ENCOUNTER — Other Ambulatory Visit: Payer: Self-pay | Admitting: Internal Medicine

## 2021-06-11 DIAGNOSIS — G459 Transient cerebral ischemic attack, unspecified: Secondary | ICD-10-CM

## 2021-06-12 ENCOUNTER — Ambulatory Visit
Admission: RE | Admit: 2021-06-12 | Discharge: 2021-06-12 | Disposition: A | Discharge status: Home / Self Care | Payer: Medicare Other | Source: Ambulatory Visit | Attending: Internal Medicine | Admitting: Internal Medicine

## 2021-06-12 DIAGNOSIS — G459 Transient cerebral ischemic attack, unspecified: Secondary | ICD-10-CM | POA: Insufficient documentation

## 2021-06-15 DIAGNOSIS — M25432 Effusion, left wrist: Secondary | ICD-10-CM | POA: Insufficient documentation

## 2021-06-26 ENCOUNTER — Other Ambulatory Visit: Payer: Self-pay | Admitting: Physician Assistant

## 2021-06-26 DIAGNOSIS — I639 Cerebral infarction, unspecified: Secondary | ICD-10-CM

## 2021-06-26 DIAGNOSIS — R2689 Other abnormalities of gait and mobility: Secondary | ICD-10-CM

## 2021-06-26 DIAGNOSIS — R479 Unspecified speech disturbances: Secondary | ICD-10-CM

## 2021-07-06 DIAGNOSIS — S52539A Colles' fracture of unspecified radius, initial encounter for closed fracture: Secondary | ICD-10-CM | POA: Insufficient documentation

## 2021-07-15 ENCOUNTER — Ambulatory Visit
Admission: RE | Admit: 2021-07-15 | Discharge: 2021-07-15 | Disposition: A | Discharge status: Home / Self Care | Payer: Medicare Other | Source: Ambulatory Visit | Attending: Physician Assistant | Admitting: Physician Assistant

## 2021-07-15 DIAGNOSIS — R2689 Other abnormalities of gait and mobility: Secondary | ICD-10-CM

## 2021-07-15 DIAGNOSIS — I639 Cerebral infarction, unspecified: Secondary | ICD-10-CM | POA: Insufficient documentation

## 2021-07-15 DIAGNOSIS — R479 Unspecified speech disturbances: Secondary | ICD-10-CM | POA: Diagnosis present

## 2021-07-21 ENCOUNTER — Emergency Department
Admission: EM | Admit: 2021-07-21 | Discharge: 2021-07-21 | Discharge status: Against Medical Advice | Payer: Medicare Other | Attending: Emergency Medicine | Admitting: Emergency Medicine

## 2021-07-21 ENCOUNTER — Other Ambulatory Visit: Payer: Self-pay

## 2021-07-21 DIAGNOSIS — R1110 Vomiting, unspecified: Secondary | ICD-10-CM | POA: Insufficient documentation

## 2021-07-21 DIAGNOSIS — Z5321 Procedure and treatment not carried out due to patient leaving prior to being seen by health care provider: Secondary | ICD-10-CM | POA: Diagnosis not present

## 2021-07-21 DIAGNOSIS — R197 Diarrhea, unspecified: Secondary | ICD-10-CM | POA: Diagnosis not present

## 2021-07-21 DIAGNOSIS — R630 Anorexia: Secondary | ICD-10-CM | POA: Insufficient documentation

## 2021-07-21 LAB — COMPREHENSIVE METABOLIC PANEL WITH GFR
ALT: 22 U/L (ref 0–44)
AST: 33 U/L (ref 15–41)
Albumin: 3.8 g/dL (ref 3.5–5.0)
Alkaline Phosphatase: 65 U/L (ref 38–126)
Anion gap: 11 (ref 5–15)
BUN: 48 mg/dL — ABNORMAL HIGH (ref 8–23)
CO2: 23 mmol/L (ref 22–32)
Calcium: 9 mg/dL (ref 8.9–10.3)
Chloride: 106 mmol/L (ref 98–111)
Creatinine, Ser: 1.31 mg/dL — ABNORMAL HIGH (ref 0.44–1.00)
GFR, Estimated: 40 mL/min — ABNORMAL LOW (ref >60)
Glucose, Bld: 69 mg/dL — ABNORMAL LOW (ref 70–99)
Potassium: 4 mmol/L (ref 3.5–5.1)
Sodium: 140 mmol/L (ref 135–145)
Total Bilirubin: 0.5 mg/dL (ref 0.3–1.2)
Total Protein: 7.4 g/dL (ref 6.5–8.1)

## 2021-07-21 LAB — LIPASE, BLOOD: Lipase: 28 U/L (ref 11–51)

## 2021-07-21 LAB — CBC
HCT: 37.8 % (ref 36.0–46.0)
Hemoglobin: 12.2 g/dL (ref 12.0–15.0)
MCH: 31.4 pg (ref 26.0–34.0)
MCHC: 32.3 g/dL (ref 30.0–36.0)
MCV: 97.2 fL (ref 80.0–100.0)
Platelets: 253 10*3/uL (ref 150–400)
RBC: 3.89 MIL/uL (ref 3.87–5.11)
RDW: 14.5 % (ref 11.5–15.5)
WBC: 9 10*3/uL (ref 4.0–10.5)
nRBC: 0 % (ref 0.0–0.2)

## 2021-07-21 NOTE — ED Triage Notes (Signed)
Pt to ED with family member POV from UC, pt complains of vomiting and diarrhea since 2-3 days. Low appetite. Was able to keep down diet coke yesterday. No vomiting today. Had diarrheal episode last night while sleeping, then 2-3 more times today.  Tongue appears dry. VS stable.

## 2021-07-27 ENCOUNTER — Encounter (INDEPENDENT_AMBULATORY_CARE_PROVIDER_SITE_OTHER): Payer: Self-pay | Admitting: Vascular Surgery

## 2021-07-29 DIAGNOSIS — I10 Essential (primary) hypertension: Secondary | ICD-10-CM | POA: Insufficient documentation

## 2021-07-29 DIAGNOSIS — E785 Hyperlipidemia, unspecified: Secondary | ICD-10-CM | POA: Insufficient documentation

## 2021-07-29 DIAGNOSIS — E119 Type 2 diabetes mellitus without complications: Secondary | ICD-10-CM | POA: Insufficient documentation

## 2021-07-29 DIAGNOSIS — I6529 Occlusion and stenosis of unspecified carotid artery: Secondary | ICD-10-CM | POA: Insufficient documentation

## 2021-07-29 DIAGNOSIS — I771 Stricture of artery: Secondary | ICD-10-CM | POA: Insufficient documentation

## 2021-07-29 NOTE — Progress Notes (Signed)
MRN : [id number]  Paula Hansen is a 87 y.o. (Feb 01, 1935) female who presents with chief complaint of check carotid arteries.  History of Present Illness:   The patient is seen for evaluation of carotid stenosis. The carotid stenosis was identified after she experienced a TIA.  Duplex ultrasound was obtained at [facility] and showed 50-69% RICA.  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  The patient denies amaurosis fugax. There is no recurrent history of TIA symptoms or focal motor deficits. There is no prior documented CVA.  There is no history of migraine headaches. There is no history of seizures.  The patient is taking enteric-coated aspirin 81 mg daily.  No recent shortening of the patient's walking distance or new symptoms consistent with claudication.  No history of rest pain symptoms. No new ulcers or wounds of the lower extremities have occurred.  There is no history of DVT, PE or superficial thrombophlebitis. No recent episodes of angina or shortness of breath documented.    No outpatient medications have been marked as taking for the 07/30/21 encounter (Appointment) with Gilda Crease, Latina Craver, MD.    Past Medical History:  Diagnosis Date   Arthritis    Diabetes mellitus    Diabetic neuropathy (HCC)    GERD (gastroesophageal reflux disease)    Hypercholesteremia    Hypertension    Hypothyroidism    Lumbar stenosis    L3-L4 left   PONV (postoperative nausea and vomiting)    Wears glasses    Wears partial dentures    lower    Past Surgical History:  Procedure Laterality Date   ABDOMINAL HYSTERECTOMY  1978   COLONOSCOPY     COLONOSCOPY W/ POLYPECTOMY     DILATION AND CURETTAGE OF UTERUS     JOINT REPLACEMENT     left knee October 2012.   KNEE ARTHROSCOPY W/ MENISCAL REPAIR     left knee   LUMBAR  LAMINECTOMY/DECOMPRESSION MICRODISCECTOMY  08/17/2011   Procedure: LUMBAR LAMINECTOMY/DECOMPRESSION MICRODISCECTOMY 1 LEVEL;  Surgeon: Clydene Fake, MD;  Location: [facility];  Service: Neurosurgery;  Laterality: Right;  Right Lumbar Two-Three Laminectomy/Diskectomy   LUMBAR LAMINECTOMY/DECOMPRESSION MICRODISCECTOMY Left 10/22/2015   Procedure: Laminectomy and Foraminotomy - Lumbar three-lumbar four, left;  Surgeon: Tia Alert, MD;  Location: [facility];  Service: Neurosurgery;  Laterality: Left;   TOOTH EXTRACTION      Social History Social History   Tobacco Use   Smoking status: Former    Years: 30.00    Types: Cigarettes   Smokeless tobacco: Never   Tobacco comments:    quit  in 2000-2001  Substance Use Topics   Alcohol use: No   Drug use: No    Family History Family History  Problem Relation Age of Onset   Breast cancer Maternal Aunt        14's   Stroke Mother    Stroke Father    Cancer - Other Brother    Breast cancer Cousin     Allergies  Allergen Reactions   Nickel Other (See Comments)    BREAKING OUT IN MOUTH IRRITATION RINGING IN THE EARS   Atorvastatin Other (See Comments)    Muscle Pain   Ezetimibe-Simvastatin Other (See Comments)    MYALGIAS   Pravastatin Other (See Comments)    MYALGIAS   Simvastatin Other (See Comments)  MYALGIAS   Amoxicillin Other (See Comments)    UNSPECIFIED REACTION    Codeine Other (See Comments)    Chest pain   Morphine And Related Nausea And Vomiting   Oxycodone Nausea And Vomiting   Shellfish Allergy Nausea And Vomiting   Sulfa Antibiotics Nausea Only and Other (See Comments)    Upset stomach with burning      REVIEW OF SYSTEMS (Negative unless checked)  Constitutional: [] Weight loss  [] Fever  [] Chills Cardiac: [] Chest pain   [] Chest pressure   [] Palpitations   [] Shortness of breath when laying flat   [] Shortness of breath with exertion. Vascular:  [x] Pain in legs with walking   [] Pain in legs at rest   [] History of DVT   [] Phlebitis   [] Swelling in legs   [] Varicose veins   [] Non-healing ulcers Pulmonary:   [] Uses home oxygen   [] Productive cough   [] Hemoptysis   [] Wheeze  [] COPD   [] Asthma Neurologic:  [] Dizziness   [] Seizures   [] History of stroke   [] History of TIA  [] Aphasia   [] Vissual changes   [] Weakness or numbness in arm   [] Weakness or numbness in leg Musculoskeletal:   [] Joint swelling   [] Joint pain   [] Low back pain Hematologic:  [] Easy bruising  [] Easy bleeding   [] Hypercoagulable state   [] Anemic Gastrointestinal:  [] Diarrhea   [] Vomiting  [] Gastroesophageal reflux/heartburn   [] Difficulty swallowing. Genitourinary:  [] Chronic kidney disease   [] Difficult urination  [] Frequent urination   [] Blood in urine Skin:  [] Rashes   [] Ulcers  Psychological:  [] History of anxiety   []  History of major depression.  Physical Examination  There were no vitals filed for this visit. There is no height or weight on file to calculate BMI. Gen: WD/WN, NAD Head: /AT, No temporalis wasting.  Ear/Nose/Throat: Hearing grossly intact, nares w/o erythema or drainage Eyes: PER, EOMI, sclera nonicteric.  Neck: Supple, no masses.  No bruit or JVD.  Pulmonary:  Good air movement, no audible wheezing, no use of accessory muscles.  Cardiac: RRR, normal S1, S2, no Murmurs. Vascular:  no carotid bruit noted Vessel Right Left  Radial Palpable Palpable  Carotid  Palpable  Palpable  Subclav  Palpable Palpable  Gastrointestinal: soft, non-distended. No guarding/no peritoneal signs.  Musculoskeletal: M/S 5/5 throughout.  No visible deformity.  Neurologic: CN 2-12 intact. Pain and light touch intact in extremities.  Symmetrical.  Speech is fluent. Motor exam as listed above. Psychiatric: Judgment intact, Mood & affect appropriate for pt's clinical situation. Dermatologic: No rashes or ulcers noted.  No changes consistent with cellulitis.   CBC Lab Results  Component Value Date   WBC 9.0 07/21/2021    HGB 12.2 07/21/2021   HCT 37.8 07/21/2021   MCV 97.2 07/21/2021   PLT 253 07/21/2021    BMET    Component Value Date/Time   NA 140 07/21/2021 1514   K 4.0 07/21/2021 1514   CL 106 07/21/2021 1514   CO2 23 07/21/2021 1514   GLUCOSE 69 (L) 07/21/2021 1514   BUN 48 (H) 07/21/2021 1514   CREATININE 1.31 (H) 07/21/2021 1514   CALCIUM 9.0 07/21/2021 1514   GFRNONAA 40 (L) 07/21/2021 1514   GFRAA 45 (L) 09/13/2019 1639   Estimated Creatinine Clearance: 26.4 mL/min (A) (by C-G formula based on SCr of 1.31 mg/dL (H)).  COAG Lab Results  Component Value Date   INR 0.8 09/13/2019   INR 0.9 07/19/2018   INR 0.98 10/15/2015    Radiology MR ANGIO HEAD WO CONTRAST  Result Date: 07/16/2021 CLINICAL DATA:  Abnormal speech, abnormal MRI head EXAM: MRA HEAD WITHOUT CONTRAST MRA NECK WITHOUT CONTRAST TECHNIQUE: Angiographic images of the Circle of Willis were obtained using MRA technique without intravenous contrast. Angiographic images of the neck were obtained using MRA technique without intravenous contrast. Carotid stenosis measurements (when applicable) are obtained utilizing NASCET criteria, using the distal internal carotid diameter as the denominator. COMPARISON:  None Available. FINDINGS: MRA HEAD Motion artifact is present. Intracranial internal carotid arteries are patent with atherosclerotic irregularity. Proximal middle and anterior cerebral arteries are patent. Right A1 ACA is diffusely irregular. There is minimal intermittent flow related enhancement of the right A2 ACA. Intracranial right vertebral artery is patent. No flow related enhancement of the intracranial left vertebral artery. Basilar artery is patent with diffuse irregularity. Posterior cerebral arteries are patent. Bilateral posterior communicating arteries are present. MRA NECK Common, internal, and external carotid arteries are patent. Atherosclerotic irregularity at the ICA origins without hemodynamically significant  stenosis. Extracranial right vertebral artery is patent with mild irregularity. There is no flow related enhancement of the left vertebral artery. IMPRESSION: Motion artifact. No flow seen within the left vertebral artery. This is probably chronic. Diffusely irregular basilar artery. Irregularity of the right A1 ACA with minimal flow seen within the right A2 ACA. Electronically Signed   By: Guadlupe Spanish M.D.   On: 07/16/2021 09:25   MR ANGIO NECK WO CONTRAST  Result Date: 07/16/2021 CLINICAL DATA:  Abnormal speech, abnormal MRI head EXAM: MRA HEAD WITHOUT CONTRAST MRA NECK WITHOUT CONTRAST TECHNIQUE: Angiographic images of the Circle of Willis were obtained using MRA technique without intravenous contrast. Angiographic images of the neck were obtained using MRA technique without intravenous contrast. Carotid stenosis measurements (when applicable) are obtained utilizing NASCET criteria, using the distal internal carotid diameter as the denominator. COMPARISON:  None Available. FINDINGS: MRA HEAD Motion artifact is present. Intracranial internal carotid arteries are patent with atherosclerotic irregularity. Proximal middle and anterior cerebral arteries are patent. Right A1 ACA is diffusely irregular. There is minimal intermittent flow related enhancement of the right A2 ACA. Intracranial right vertebral artery is patent. No flow related enhancement of the intracranial left vertebral artery. Basilar artery is patent with diffuse irregularity. Posterior cerebral arteries are patent. Bilateral posterior communicating arteries are present. MRA NECK Common, internal, and external carotid arteries are patent. Atherosclerotic irregularity at the ICA origins without hemodynamically significant stenosis. Extracranial right vertebral artery is patent with mild irregularity. There is no flow related enhancement of the left vertebral artery. IMPRESSION: Motion artifact. No flow seen within the left vertebral artery. This  is probably chronic. Diffusely irregular basilar artery. Irregularity of the right A1 ACA with minimal flow seen within the right A2 ACA. Electronically Signed   By: Guadlupe Spanish M.D.   On: 07/16/2021 09:25     Assessment/Plan 1. Stenosis of carotid artery, unspecified laterality Recommend:  Given the patient's asymptomatic subcritical stenosis no further invasive testing or surgery at this time.  Duplex ultrasound shows RICA 50-69% and LICA 1-39% stenosis.  Continue antiplatelet therapy as prescribed Continue management of CAD, HTN and Hyperlipidemia Healthy heart diet,  encouraged exercise at least 4 times per week Follow up in 6 months with duplex ultrasound and physical exam   - VAS US CAROTID; Future  2. Subclavian artery stenosis (HCC) See #1 - VAS US CAROTID; Future  3. Essential hypertension Continue antihypertensive medications as already ordered, these medications have been reviewed and there are no changes at this time.  4. Mixed hyperlipidemia Continue statin as ordered and reviewed, no changes at this time   5. Type 2 diabetes mellitus with other circulatory complication, without long-term current use of insulin (HCC) Continue hypoglycemic medications as already ordered, these medications have been reviewed and there are no changes at this time.  Hgb A1C to be monitored as already arranged by primary service     Levora DredgeGregory Tomio Kirk, MD  07/29/2021 9:04 PM

## 2021-07-30 ENCOUNTER — Ambulatory Visit (INDEPENDENT_AMBULATORY_CARE_PROVIDER_SITE_OTHER): Payer: Medicare Other | Admitting: Vascular Surgery

## 2021-07-30 ENCOUNTER — Encounter (INDEPENDENT_AMBULATORY_CARE_PROVIDER_SITE_OTHER): Payer: Self-pay | Admitting: Vascular Surgery

## 2021-07-30 DIAGNOSIS — I771 Stricture of artery: Secondary | ICD-10-CM | POA: Diagnosis not present

## 2021-07-30 DIAGNOSIS — I6529 Occlusion and stenosis of unspecified carotid artery: Secondary | ICD-10-CM

## 2021-07-30 DIAGNOSIS — E1159 Type 2 diabetes mellitus with other circulatory complications: Secondary | ICD-10-CM

## 2021-07-30 DIAGNOSIS — E782 Mixed hyperlipidemia: Secondary | ICD-10-CM

## 2021-07-30 DIAGNOSIS — I10 Essential (primary) hypertension: Secondary | ICD-10-CM

## 2021-08-01 ENCOUNTER — Encounter (INDEPENDENT_AMBULATORY_CARE_PROVIDER_SITE_OTHER): Payer: Self-pay | Admitting: Vascular Surgery

## 2021-12-21 ENCOUNTER — Encounter (INDEPENDENT_AMBULATORY_CARE_PROVIDER_SITE_OTHER): Payer: Self-pay

## 2022-01-23 NOTE — Progress Notes (Signed)
MRN : [id number]  Paula Hansen is a 87 y.o. (Feb 05, 1935) female who presents with chief complaint of check carotid arteries.  History of Present Illness:   The patient is seen for evaluation of carotid stenosis. The carotid stenosis was identified after she experienced a TIA.  Duplex ultrasound was obtained at [facility] and showed 50-69% RICA.  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   The patient denies amaurosis fugax. There is no recurrent history of TIA symptoms or focal motor deficits. There is no prior documented CVA.   There is no history of migraine headaches. There is no history of seizures.   The patient is taking enteric-coated aspirin 81 mg daily.   No recent shortening of the patient's walking distance or new symptoms consistent with claudication.  No history of rest pain symptoms. No new ulcers or wounds of the lower extremities have occurred.   There is no history of DVT, PE or superficial thrombophlebitis. No recent episodes of angina or shortness of breath documented.   Carotid duplex done today shows RICA 123456 and LICA 123456 right vertebral is antegrade and the left vertebral is absent.  Left subclavian is monophasic.   No outpatient medications have been marked as taking for the 01/25/22 encounter (Appointment) with Delana Meyer, Dolores Lory, MD.    Past Medical History:  Diagnosis Date   Arthritis    Diabetes mellitus    Diabetic neuropathy ([place])    GERD (gastroesophageal reflux disease)    Hypercholesteremia    Hypertension    Hypothyroidism    Lumbar stenosis    L3-L4 left   PONV (postoperative nausea and vomiting)    Wears glasses    Wears partial dentures    lower    Past Surgical History:  Procedure Laterality Date   ABDOMINAL HYSTERECTOMY  1978   COLONOSCOPY     COLONOSCOPY W/ POLYPECTOMY      DILATION AND CURETTAGE OF UTERUS     JOINT REPLACEMENT     left knee October 2012.   KNEE ARTHROSCOPY W/ MENISCAL REPAIR     left knee   LUMBAR LAMINECTOMY/DECOMPRESSION MICRODISCECTOMY  08/17/2011   Procedure: LUMBAR LAMINECTOMY/DECOMPRESSION MICRODISCECTOMY 1 LEVEL;  Surgeon: Otilio Connors, MD;  Location: [facility];  Service: Neurosurgery;  Laterality: Right;  Right Lumbar Two-Three Laminectomy/Diskectomy   LUMBAR LAMINECTOMY/DECOMPRESSION MICRODISCECTOMY Left 10/22/2015   Procedure: Laminectomy and Foraminotomy - Lumbar three-lumbar four, left;  Surgeon: Eustace Moore, MD;  Location: [facility];  Service: Neurosurgery;  Laterality: Left;   TOOTH EXTRACTION      Social History Social History   Tobacco Use   Smoking status: Former    Years: 30.00    Types: Cigarettes   Smokeless tobacco: Never   Tobacco comments:    quit  in 2000-2001  Substance Use Topics   Alcohol use: No   Drug use: No    Family History Family History  Problem Relation Age of Onset   Breast cancer Maternal Aunt        69's   Stroke Mother    Stroke Father    Cancer - Other Brother    Breast cancer Cousin     Allergies  Allergen Reactions   Nickel Other (See Comments)    BREAKING OUT IN MOUTH IRRITATION RINGING IN THE EARS   Atorvastatin Other (See Comments)  Muscle Pain   Ezetimibe-Simvastatin Other (See Comments)    MYALGIAS   Pravastatin Other (See Comments)    MYALGIAS   Simvastatin Other (See Comments)    MYALGIAS   Amoxicillin Other (See Comments)    UNSPECIFIED REACTION    Codeine Other (See Comments)    Chest pain   Morphine And Related Nausea And Vomiting   Oxycodone Nausea And Vomiting   Shellfish Allergy Nausea And Vomiting   Sulfa Antibiotics Nausea Only and Other (See Comments)    Upset stomach with burning      REVIEW OF SYSTEMS (Negative unless checked)  Constitutional: [] Weight loss  [] Fever  [] Chills Cardiac: [] Chest pain   [] Chest pressure    [] Palpitations   [] Shortness of breath when laying flat   [] Shortness of breath with exertion. Vascular:  [x] Pain in legs with walking   [] Pain in legs at rest  [] History of DVT   [] Phlebitis   [] Swelling in legs   [] Varicose veins   [] Non-healing ulcers Pulmonary:   [] Uses home oxygen   [] Productive cough   [] Hemoptysis   [] Wheeze  [] COPD   [] Asthma Neurologic:  [] Dizziness   [] Seizures   [] History of stroke   [] History of TIA  [] Aphasia   [] Vissual changes   [] Weakness or numbness in arm   [] Weakness or numbness in leg Musculoskeletal:   [] Joint swelling   [] Joint pain   [] Low back pain Hematologic:  [] Easy bruising  [] Easy bleeding   [] Hypercoagulable state   [] Anemic Gastrointestinal:  [] Diarrhea   [] Vomiting  [] Gastroesophageal reflux/heartburn   [] Difficulty swallowing. Genitourinary:  [] Chronic kidney disease   [] Difficult urination  [] Frequent urination   [] Blood in urine Skin:  [] Rashes   [] Ulcers  Psychological:  [] History of anxiety   []  History of major depression.  Physical Examination  There were no vitals filed for this visit. There is no height or weight on file to calculate BMI. Gen: WD/WN, NAD Head: [place]/AT, No temporalis wasting.  Ear/Nose/Throat: Hearing grossly intact, nares w/o erythema or drainage Eyes: PER, EOMI, sclera nonicteric.  Neck: Supple, no masses.  No bruit or JVD.  Pulmonary:  Good air movement, no audible wheezing, no use of accessory muscles.  Cardiac: RRR, normal S1, S2, no Murmurs. Vascular:  carotid bruit noted Vessel Right Left  Radial Palpable Weakly Palpable  Carotid  Palpable  Palpable  Subclav  Palpable Not Palpable  Gastrointestinal: soft, non-distended. No guarding/no peritoneal signs.  Musculoskeletal: M/S 5/5 throughout.  No visible deformity.  Neurologic: CN 2-12 intact. Pain and light touch intact in extremities.  Symmetrical.  Speech is fluent. Motor exam as listed above. Psychiatric: Judgment intact, Mood & affect appropriate for pt's  clinical situation. Dermatologic: No rashes or ulcers noted.  No changes consistent with cellulitis.   CBC Lab Results  Component Value Date   WBC 9.0 07/21/2021   HGB 12.2 07/21/2021   HCT 37.8 07/21/2021   MCV 97.2 07/21/2021   PLT 253 07/21/2021    BMET    Component Value Date/Time   NA 140 07/21/2021 1514   K 4.0 07/21/2021 1514   CL 106 07/21/2021 1514   CO2 23 07/21/2021 1514   GLUCOSE 69 (L) 07/21/2021 1514   BUN 48 (H) 07/21/2021 1514   CREATININE 1.31 (H) 07/21/2021 1514   CALCIUM 9.0 07/21/2021 1514   GFRNONAA 40 (L) 07/21/2021 1514   GFRAA 45 (L) 09/13/2019 1639   CrCl cannot be calculated (Patient's most recent lab result is older than the maximum 21 days  allowed.).  COAG Lab Results  Component Value Date   INR 0.8 09/13/2019   INR 0.9 07/19/2018   INR 0.98 10/15/2015    Radiology No results found.   Assessment/Plan 1. Stenosis of carotid artery, unspecified laterality Recommend:  Given the patient's asymptomatic subcritical stenosis no further invasive testing or surgery at this time.  Carotid duplex done today shows RICA 123456 and LICA 123456 right vertebral is antegrade and the left vertebral is absent.  Left subclavian is monophasic.   Continue antiplatelet therapy as prescribed Continue management of CAD, HTN and Hyperlipidemia Healthy heart diet,  encouraged exercise at least 4 times per week Follow up in 12 months with duplex ultrasound and physical exam.    - VAS US CAROTID - VAS US CAROTID; Future  2. Subclavian artery stenosis (HCC) Recommend:  Given the patient's asymptomatic left subclavian stenosis in association with the apparent occlusion of the left vertebral artery (therefore eliminating steal syndrome) no further invasive testing or surgery at this time.  Carotid duplex done today shows RICA 123456 and LICA 123456 right vertebral is antegrade and the left vertebral is absent.  Left subclavian is monophasic.   Continue  antiplatelet therapy as prescribed Continue management of CAD, HTN and Hyperlipidemia Healthy heart diet,  encouraged exercise at least 4 times per week Follow up in 12 months with duplex ultrasound and physical exam.  - VAS US CAROTID  3. Essential hypertension Continue antihypertensive medications as already ordered, these medications have been reviewed and there are no changes at this time.  4. Type 2 diabetes mellitus with other circulatory complication, without long-term current use of insulin (HCC) Continue hypoglycemic medications as already ordered, these medications have been reviewed and there are no changes at this time.  Hgb A1C to be monitored as already arranged by primary service  5. S/P lumbar laminectomy Continue NSAID medications as already ordered, these medications have been reviewed and there are no changes at this time.  Continued activity and therapy was stressed.  6. Mixed hyperlipidemia Continue statin as ordered and reviewed, no changes at this time    Hortencia Pilar, MD  01/23/2022 3:29 PM

## 2022-01-25 ENCOUNTER — Ambulatory Visit (INDEPENDENT_AMBULATORY_CARE_PROVIDER_SITE_OTHER): Payer: Medicare Other

## 2022-01-25 ENCOUNTER — Ambulatory Visit (INDEPENDENT_AMBULATORY_CARE_PROVIDER_SITE_OTHER): Payer: Medicare Other | Admitting: Vascular Surgery

## 2022-01-25 ENCOUNTER — Encounter (INDEPENDENT_AMBULATORY_CARE_PROVIDER_SITE_OTHER): Payer: Self-pay | Admitting: Vascular Surgery

## 2022-01-25 VITALS — BP 114/76 | HR 77 | Resp 17 | Ht 63.0 in | Wt 140.0 lb

## 2022-01-25 DIAGNOSIS — I6529 Occlusion and stenosis of unspecified carotid artery: Secondary | ICD-10-CM

## 2022-01-25 DIAGNOSIS — I771 Stricture of artery: Secondary | ICD-10-CM

## 2022-01-25 DIAGNOSIS — I10 Essential (primary) hypertension: Secondary | ICD-10-CM

## 2022-01-25 DIAGNOSIS — E782 Mixed hyperlipidemia: Secondary | ICD-10-CM

## 2022-01-25 DIAGNOSIS — E1159 Type 2 diabetes mellitus with other circulatory complications: Secondary | ICD-10-CM

## 2022-01-25 DIAGNOSIS — Z9889 Other specified postprocedural states: Secondary | ICD-10-CM

## 2022-01-31 ENCOUNTER — Encounter (INDEPENDENT_AMBULATORY_CARE_PROVIDER_SITE_OTHER): Payer: Self-pay | Admitting: Vascular Surgery

## 2022-02-05 ENCOUNTER — Observation Stay
Admission: EM | Admit: 2022-02-05 | Discharge: 2022-02-07 | Disposition: A | Discharge status: Home Health | Payer: Medicare Other | Attending: Osteopathic Medicine | Admitting: Osteopathic Medicine

## 2022-02-05 ENCOUNTER — Emergency Department: Payer: Medicare Other

## 2022-02-05 DIAGNOSIS — E1140 Type 2 diabetes mellitus with diabetic neuropathy, unspecified: Secondary | ICD-10-CM | POA: Insufficient documentation

## 2022-02-05 DIAGNOSIS — I671 Cerebral aneurysm, nonruptured: Secondary | ICD-10-CM | POA: Diagnosis not present

## 2022-02-05 DIAGNOSIS — I651 Occlusion and stenosis of basilar artery: Secondary | ICD-10-CM | POA: Diagnosis not present

## 2022-02-05 DIAGNOSIS — I6521 Occlusion and stenosis of right carotid artery: Secondary | ICD-10-CM | POA: Insufficient documentation

## 2022-02-05 DIAGNOSIS — E1169 Type 2 diabetes mellitus with other specified complication: Secondary | ICD-10-CM

## 2022-02-05 DIAGNOSIS — Z87891 Personal history of nicotine dependence: Secondary | ICD-10-CM | POA: Insufficient documentation

## 2022-02-05 DIAGNOSIS — I6502 Occlusion and stenosis of left vertebral artery: Secondary | ICD-10-CM | POA: Diagnosis not present

## 2022-02-05 DIAGNOSIS — E039 Hypothyroidism, unspecified: Secondary | ICD-10-CM | POA: Insufficient documentation

## 2022-02-05 DIAGNOSIS — Z9889 Other specified postprocedural states: Secondary | ICD-10-CM

## 2022-02-05 DIAGNOSIS — I129 Hypertensive chronic kidney disease with stage 1 through stage 4 chronic kidney disease, or unspecified chronic kidney disease: Secondary | ICD-10-CM | POA: Diagnosis not present

## 2022-02-05 DIAGNOSIS — Z7982 Long term (current) use of aspirin: Secondary | ICD-10-CM | POA: Insufficient documentation

## 2022-02-05 DIAGNOSIS — G459 Transient cerebral ischemic attack, unspecified: Secondary | ICD-10-CM | POA: Diagnosis not present

## 2022-02-05 DIAGNOSIS — I700 Atherosclerosis of aorta: Secondary | ICD-10-CM | POA: Diagnosis not present

## 2022-02-05 DIAGNOSIS — E113293 Type 2 diabetes mellitus with mild nonproliferative diabetic retinopathy without macular edema, bilateral: Secondary | ICD-10-CM

## 2022-02-05 DIAGNOSIS — S52539S Colles' fracture of unspecified radius, sequela: Secondary | ICD-10-CM

## 2022-02-05 DIAGNOSIS — E1159 Type 2 diabetes mellitus with other circulatory complications: Secondary | ICD-10-CM

## 2022-02-05 DIAGNOSIS — Z7902 Long term (current) use of antithrombotics/antiplatelets: Secondary | ICD-10-CM | POA: Diagnosis not present

## 2022-02-05 DIAGNOSIS — Z79899 Other long term (current) drug therapy: Secondary | ICD-10-CM | POA: Diagnosis not present

## 2022-02-05 DIAGNOSIS — R4702 Dysphasia: Secondary | ICD-10-CM | POA: Diagnosis present

## 2022-02-05 DIAGNOSIS — M25432 Effusion, left wrist: Secondary | ICD-10-CM

## 2022-02-05 DIAGNOSIS — Z7984 Long term (current) use of oral hypoglycemic drugs: Secondary | ICD-10-CM | POA: Diagnosis not present

## 2022-02-05 DIAGNOSIS — E1142 Type 2 diabetes mellitus with diabetic polyneuropathy: Secondary | ICD-10-CM

## 2022-02-05 DIAGNOSIS — Z789 Other specified health status: Secondary | ICD-10-CM | POA: Insufficient documentation

## 2022-02-05 DIAGNOSIS — I10 Essential (primary) hypertension: Secondary | ICD-10-CM

## 2022-02-05 DIAGNOSIS — I771 Stricture of artery: Secondary | ICD-10-CM

## 2022-02-05 DIAGNOSIS — I6529 Occlusion and stenosis of unspecified carotid artery: Secondary | ICD-10-CM

## 2022-02-05 DIAGNOSIS — K219 Gastro-esophageal reflux disease without esophagitis: Secondary | ICD-10-CM

## 2022-02-05 DIAGNOSIS — Z96652 Presence of left artificial knee joint: Secondary | ICD-10-CM | POA: Diagnosis not present

## 2022-02-05 DIAGNOSIS — N189 Chronic kidney disease, unspecified: Secondary | ICD-10-CM | POA: Insufficient documentation

## 2022-02-05 DIAGNOSIS — N1831 Chronic kidney disease, stage 3a: Secondary | ICD-10-CM

## 2022-02-05 DIAGNOSIS — E782 Mixed hyperlipidemia: Secondary | ICD-10-CM

## 2022-02-05 LAB — URINALYSIS, ROUTINE W REFLEX MICROSCOPIC
Bilirubin Urine: NEGATIVE
Glucose, UA: NEGATIVE mg/dL
Hgb urine dipstick: NEGATIVE
Ketones, ur: NEGATIVE mg/dL
Nitrite: NEGATIVE
Protein, ur: 30 mg/dL — AB
Specific Gravity, Urine: 1.009 (ref 1.005–1.030)
pH: 6 (ref 5.0–8.0)

## 2022-02-05 LAB — COMPREHENSIVE METABOLIC PANEL
ALT: 14 U/L (ref 0–44)
AST: 21 U/L (ref 15–41)
Albumin: 4 g/dL (ref 3.5–5.0)
Alkaline Phosphatase: 63 U/L (ref 38–126)
Anion gap: 9 (ref 5–15)
BUN: 38 mg/dL — ABNORMAL HIGH (ref 8–23)
CO2: 23 mmol/L (ref 22–32)
Calcium: 9.4 mg/dL (ref 8.9–10.3)
Chloride: 107 mmol/L (ref 98–111)
Creatinine, Ser: 1.17 mg/dL — ABNORMAL HIGH (ref 0.44–1.00)
GFR, Estimated: 46 mL/min — ABNORMAL LOW (ref >60)
Glucose, Bld: 97 mg/dL (ref 70–99)
Potassium: 4.2 mmol/L (ref 3.5–5.1)
Sodium: 139 mmol/L (ref 135–145)
Total Bilirubin: 0.5 mg/dL (ref 0.3–1.2)
Total Protein: 7.5 g/dL (ref 6.5–8.1)

## 2022-02-05 LAB — APTT: aPTT: 37 seconds — ABNORMAL HIGH (ref 24–36)

## 2022-02-05 LAB — DIFFERENTIAL
Abs Immature Granulocytes: 0.02 10*3/uL (ref 0.00–0.07)
Basophils Absolute: 0 10*3/uL (ref 0.0–0.1)
Basophils Relative: 0 %
Eosinophils Absolute: 0.1 10*3/uL (ref 0.0–0.5)
Eosinophils Relative: 1 %
Immature Granulocytes: 0 %
Lymphocytes Relative: 14 %
Lymphs Abs: 1 10*3/uL (ref 0.7–4.0)
Monocytes Absolute: 0.8 10*3/uL (ref 0.1–1.0)
Monocytes Relative: 11 %
Neutro Abs: 5.4 10*3/uL (ref 1.7–7.7)
Neutrophils Relative %: 74 %

## 2022-02-05 LAB — PROTIME-INR
INR: 0.9 (ref 0.8–1.2)
Prothrombin Time: 12.2 seconds (ref 11.4–15.2)

## 2022-02-05 LAB — URINE DRUG SCREEN, QUALITATIVE (ARMC ONLY)
Amphetamines, Ur Screen: NOT DETECTED
Barbiturates, Ur Screen: NOT DETECTED
Benzodiazepine, Ur Scrn: NOT DETECTED
Cannabinoid 50 Ng, Ur ~~LOC~~: NOT DETECTED
Cocaine Metabolite,Ur ~~LOC~~: NOT DETECTED
MDMA (Ecstasy)Ur Screen: NOT DETECTED
Methadone Scn, Ur: NOT DETECTED
Opiate, Ur Screen: NOT DETECTED
Phencyclidine (PCP) Ur S: NOT DETECTED
Tricyclic, Ur Screen: NOT DETECTED

## 2022-02-05 LAB — CBC
HCT: 36.9 % (ref 36.0–46.0)
Hemoglobin: 11.7 g/dL — ABNORMAL LOW (ref 12.0–15.0)
MCH: 31.7 pg (ref 26.0–34.0)
MCHC: 31.7 g/dL (ref 30.0–36.0)
MCV: 100 fL (ref 80.0–100.0)
Platelets: 292 10*3/uL (ref 150–400)
RBC: 3.69 MIL/uL — ABNORMAL LOW (ref 3.87–5.11)
RDW: 13.9 % (ref 11.5–15.5)
WBC: 7.4 10*3/uL (ref 4.0–10.5)
nRBC: 0 % (ref 0.0–0.2)

## 2022-02-05 LAB — ETHANOL: Alcohol, Ethyl (B): <10 mg/dL (ref <10)

## 2022-02-05 LAB — CBG MONITORING, ED: Glucose-Capillary: 84 mg/dL (ref 70–99)

## 2022-02-05 MED ORDER — LABETALOL HCL 5 MG/ML IV SOLN
5.0000 mg | Freq: Once | INTRAVENOUS | Status: DC
  Filled 2022-02-05: qty 4

## 2022-02-05 MED ORDER — LABETALOL HCL 5 MG/ML IV SOLN: 10.0000 mg | Freq: Once | INTRAVENOUS | Status: DC

## 2022-02-05 NOTE — H&P (Incomplete)
History and Physical    Patient: Paula Hansen GXQ:[id number] DOB: April 30, 1934 DOA: 02/05/2022 DOS: the patient was seen and examined on 02/05/2022 PCP: Gracelyn Nurse, MD  Patient coming from: Home  Chief Complaint:  Chief Complaint  Patient presents with  . Code Stroke   HPI: Paula Hansen is a 87 y.o. female with medical history significant for type 2 diabetes, CKD, hypertension, hypothyroidism, who presents after an episode of word finding difficulty.  Patient reports that she was at the [employer] in [place] with her PCA and husband earlier today.  When they sat down to eat she suddenly could not speak clearly or make any sense.  This was very alarming to both her husband and her PCA and they called 911.  PCA was worried in particular because patient's husband had just had a stroke recently.  They estimate the episode lasted about 40 minutes and that she is now back to her baseline.  In the ED her vital signs were notable only for hypertension.  CMP and CBC were at her baseline.  UA was unremarkable.  UDS was normal.  CT head without contrast did not show any acute findings.  Neurology was consulted and concern for TIA versus stroke.  They recommended usual workup and she was admitted.  Review of Systems: As mentioned in the history of present illness. All other systems reviewed and are negative. Past Medical History:  Diagnosis Date  . Arthritis   . Diabetes mellitus   . Diabetic neuropathy (HCC)   . GERD (gastroesophageal reflux disease)   . Hypercholesteremia   . Hypertension   . Hypothyroidism   . Lumbar stenosis    L3-L4 left  . PONV (postoperative nausea and vomiting)   . Wears glasses   . Wears partial dentures    lower   Past Surgical History:  Procedure Laterality Date  . ABDOMINAL HYSTERECTOMY  1978  . COLONOSCOPY    . COLONOSCOPY W/ POLYPECTOMY    . DILATION AND CURETTAGE OF UTERUS    . JOINT REPLACEMENT     left knee October 2012.  Marland Kitchen KNEE  ARTHROSCOPY W/ MENISCAL REPAIR     left knee  . LUMBAR LAMINECTOMY/DECOMPRESSION MICRODISCECTOMY  08/17/2011   Procedure: LUMBAR LAMINECTOMY/DECOMPRESSION MICRODISCECTOMY 1 LEVEL;  Surgeon: Clydene Fake, MD;  Location: [facility];  Service: Neurosurgery;  Laterality: Right;  Right Lumbar Two-Three Laminectomy/Diskectomy  . LUMBAR LAMINECTOMY/DECOMPRESSION MICRODISCECTOMY Left 10/22/2015   Procedure: Laminectomy and Foraminotomy - Lumbar three-lumbar four, left;  Surgeon: Tia Alert, MD;  Location: [facility];  Service: Neurosurgery;  Laterality: Left;  . TOOTH EXTRACTION     Social History:  reports that she has quit smoking. Her smoking use included cigarettes. She has never used smokeless tobacco. She reports that she does not drink alcohol and does not use drugs.  Allergies  Allergen Reactions  . Nickel Other (See Comments)    BREAKING OUT IN MOUTH IRRITATION RINGING IN THE EARS  . Atorvastatin Other (See Comments)    Muscle Pain  . Ezetimibe-Simvastatin Other (See Comments)    MYALGIAS  . Pravastatin Other (See Comments)    MYALGIAS  . Simvastatin Other (See Comments)    MYALGIAS  . Amoxicillin Other (See Comments)    UNSPECIFIED REACTION   . Codeine Other (See Comments)    Chest pain  . Morphine And Related Nausea And Vomiting  . Oxycodone Nausea And Vomiting  . Shellfish Allergy Nausea And Vomiting  . Sulfa Antibiotics  Nausea Only and Other (See Comments)    Upset stomach with burning     Family History  Problem Relation Age of Onset  . Breast cancer Maternal Aunt        50's  . Stroke Mother   . Stroke Father   . Cancer - Other Brother   . Breast cancer Cousin     Prior to Admission medications   Medication Sig Start Date End Date Taking? Authorizing Provider  amLODipine-benazepril (LOTREL) 10-20 MG per capsule Take 1 capsule by mouth daily.   Yes [provider]  aspirin EC 81 MG tablet Take 81 mg by mouth every other day.    Yes [provider]  Biotin 2.5 MG TABS Take 2,500 mcg by mouth daily.    Yes [provider]  Calcium Carbonate-Vit D-Min (CALTRATE PLUS PO) Take 1 tablet by mouth daily.   Yes [provider]  cetirizine (ZYRTEC) 10 MG tablet Take 10 mg by mouth daily.   Yes [provider]  Cholecalciferol (VITAMIN D-1000 MAX ST) 1000 units tablet Take 1,000 Units by mouth daily.    Yes [provider]  clopidogrel (PLAVIX) 75 MG tablet Take by mouth. 06/15/21 06/15/22 Yes [provider]  Coenzyme Q10 10 MG capsule Take 10 mg by mouth daily.   Yes [provider]  esomeprazole (NEXIUM) 40 MG capsule Take 40 mg by mouth daily before breakfast.   Yes [provider]  Flaxseed, Linseed, (FLAXSEED OIL) 1000 MG CAPS Take 1,000 mg by mouth daily.   Yes [provider]  gabapentin (NEURONTIN) 300 MG capsule Take 300 mg by mouth 3 (three) times daily.   Yes [provider]  glipiZIDE (GLUCOTROL XL) 10 MG 24 hr tablet Take 10 mg by mouth 2 (two) times daily.  08/02/15  Yes [provider]  levothyroxine (SYNTHROID, LEVOTHROID) 50 MCG tablet Take 50 mcg by mouth daily before breakfast.  08/19/15  Yes [provider]  metFORMIN (GLUCOPHAGE) 500 MG tablet Take 1,000 mg by mouth 2 (two) times daily with a meal.    Yes [provider]  Multiple Vitamin (MULTIVITAMIN WITH MINERALS) TABS Take 1 tablet by mouth daily.   Yes [provider]  pioglitazone (ACTOS) 30 MG tablet Take 1 tablet by mouth daily. 07/20/21  Yes [provider]  Probiotic Product (ALIGN PO) Take by mouth.   Yes [provider]  rosuvastatin (CRESTOR) 10 MG tablet Take 10 mg by mouth every other day.   Yes [provider]  vitamin E 400 UNIT capsule Take 400 Units by mouth daily.   Yes [provider]  naproxen sodium (ANAPROX) 220 MG tablet Take 220 mg by mouth 2 (two) times daily as needed (for pain).     [provider]    Physical Exam: Vitals:   02/05/22 2130 02/05/22 2145 02/05/22 2200 02/05/22 2245  BP: (!) 184/76 (!) 177/75 (!) 181/76   Pulse: 82 84 85   Resp: 20 (!) 25 14   Temp:      TempSrc:      SpO2: 92% 96% 96%   Weight:    70.1 kg  Height:    5\' 3"  (1.6 m)   Physical Exam Constitutional:      General: She is not in acute distress.    Appearance: Normal appearance. She is not ill-appearing, toxic-appearing or diaphoretic.  HENT:     Head: Normocephalic and atraumatic.     Mouth/Throat:  Mouth: Mucous membranes are moist.  Eyes:     General: No scleral icterus. Cardiovascular:     Rate and Rhythm: Normal rate and regular rhythm.     Heart sounds: Normal heart sounds.  Pulmonary:     Effort: Pulmonary effort is normal.     Breath sounds: Normal breath sounds.  Abdominal:     General: Abdomen is flat.     Palpations: Abdomen is soft.  Skin:    General: Skin is warm and dry.  Neurological:     General: No focal deficit present.     Mental Status: She is alert and oriented to person, place, and time.     Cranial Nerves: Cranial nerves 2-12 are intact. No cranial nerve deficit.     Motor: No weakness or pronator drift.  Psychiatric:        Mood and Affect: Mood normal.        Thought Content: Thought content normal.     Data Reviewed: Results for orders placed or performed during the hospital encounter of 02/05/22 (from the past 24 hour(s))  CBG monitoring, ED     Status: None   Collection Time: 02/05/22  9:00 PM  Result Value Ref Range   Glucose-Capillary 84 70 - 99 mg/dL  Ethanol     Status: None   Collection Time: 02/05/22  9:19 PM  Result Value Ref Range   Alcohol, Ethyl (B) <10 <10 mg/dL  Protime-INR     Status: None   Collection Time: 02/05/22  9:19 PM  Result Value Ref Range   Prothrombin Time 12.2 11.4 - 15.2 seconds   INR 0.9 0.8 - 1.2  APTT     Status: Abnormal   Collection Time: 02/05/22  9:19 PM  Result Value Ref Range   aPTT 37 (H)  24 - 36 seconds  CBC     Status: Abnormal   Collection Time: 02/05/22  9:19 PM  Result Value Ref Range   WBC 7.4 4.0 - 10.5 K/uL   RBC 3.69 (L) 3.87 - 5.11 MIL/uL   Hemoglobin 11.7 (L) 12.0 - 15.0 g/dL   HCT 16.136.9 09.636.0 - 04.546.0 %   MCV 100.0 80.0 - 100.0 fL   MCH 31.7 26.0 - 34.0 pg   MCHC 31.7 30.0 - 36.0 g/dL   RDW 40.913.9 81.111.5 - 91.415.5 %   Platelets 292 150 - 400 K/uL   nRBC 0.0 0.0 - 0.2 %  Differential     Status: None   Collection Time: 02/05/22  9:19 PM  Result Value Ref Range   Neutrophils Relative % 74 %   Neutro Abs 5.4 1.7 - 7.7 K/uL   Lymphocytes Relative 14 %   Lymphs Abs 1.0 0.7 - 4.0 K/uL   Monocytes Relative 11 %   Monocytes Absolute 0.8 0.1 - 1.0 K/uL   Eosinophils Relative 1 %   Eosinophils Absolute 0.1 0.0 - 0.5 K/uL   Basophils Relative 0 %   Basophils Absolute 0.0 0.0 - 0.1 K/uL   Immature Granulocytes 0 %   Abs Immature Granulocytes 0.02 0.00 - 0.07 K/uL  Comprehensive metabolic panel     Status: Abnormal   Collection Time: 02/05/22  9:19 PM  Result Value Ref Range   Sodium 139 135 - 145 mmol/L   Potassium 4.2 3.5 - 5.1 mmol/L   Chloride 107 98 - 111 mmol/L   CO2 23 22 - 32 mmol/L   Glucose, Bld 97 70 - 99 mg/dL   BUN 38 (H)  8 - 23 mg/dL   Creatinine, Ser 9.44 (H) 0.44 - 1.00 mg/dL   Calcium 9.4 8.9 - 96.7 mg/dL   Total Protein 7.5 6.5 - 8.1 g/dL   Albumin 4.0 3.5 - 5.0 g/dL   AST 21 15 - 41 U/L   ALT 14 0 - 44 U/L   Alkaline Phosphatase 63 38 - 126 U/L   Total Bilirubin 0.5 0.3 - 1.2 mg/dL   GFR, Estimated 46 (L) >60 mL/min   Anion gap 9 5 - 15  Urine Drug Screen, Qualitative     Status: None   Collection Time: 02/05/22 10:50 PM  Result Value Ref Range   Tricyclic, Ur Screen NONE DETECTED NONE DETECTED   Amphetamines, Ur Screen NONE DETECTED NONE DETECTED   MDMA (Ecstasy)Ur Screen NONE DETECTED NONE DETECTED   Cocaine Metabolite,Ur [place] NONE DETECTED NONE DETECTED   Opiate, Ur Screen NONE DETECTED NONE DETECTED   Phencyclidine (PCP) Ur S NONE  DETECTED NONE DETECTED   Cannabinoid 50 Ng, Ur [place] NONE DETECTED NONE DETECTED   Barbiturates, Ur Screen NONE DETECTED NONE DETECTED   Benzodiazepine, Ur Scrn NONE DETECTED NONE DETECTED   Methadone Scn, Ur NONE DETECTED NONE DETECTED  Urinalysis, Routine w reflex microscopic Urine, Clean Catch     Status: Abnormal   Collection Time: 02/05/22 10:50 PM  Result Value Ref Range   Color, Urine STRAW (A) YELLOW   APPearance CLEAR (A) CLEAR   Specific Gravity, Urine 1.009 1.005 - 1.030   pH 6.0 5.0 - 8.0   Glucose, UA NEGATIVE NEGATIVE mg/dL   Hgb urine dipstick NEGATIVE NEGATIVE   Bilirubin Urine NEGATIVE NEGATIVE   Ketones, ur NEGATIVE NEGATIVE mg/dL   Protein, ur 30 (A) NEGATIVE mg/dL   Nitrite NEGATIVE NEGATIVE   Leukocytes,Ua TRACE (A) NEGATIVE   RBC / HPF 0-5 0 - 5 RBC/hpf   WBC, UA 0-5 0 - 5 WBC/hpf   Bacteria, UA RARE (A) NONE SEEN   Squamous Epithelial / LPF 0-5 0 - 5   CT HEAD CODE STROKE WO CONTRAST  Result Date: 02/05/2022 CLINICAL DATA:  Code stroke.  Expressive aphasia EXAM: CT HEAD WITHOUT CONTRAST TECHNIQUE: Contiguous axial images were obtained from the base of the skull through the vertex without intravenous contrast. RADIATION DOSE REDUCTION: This exam was performed according to the departmental dose-optimization program which includes automated exposure control, adjustment of the mA and/or kV according to patient size and/or use of iterative reconstruction technique. COMPARISON:  None Available. FINDINGS: Brain: There is no mass, hemorrhage or extra-axial collection. The size and configuration of the ventricles and extra-axial CSF spaces are normal. There is hypoattenuation of the periventricular white matter, most commonly indicating chronic ischemic microangiopathy. Vascular: No abnormal hyperdensity of the major intracranial arteries or dural venous sinuses. No intracranial atherosclerosis. Skull: The visualized skull base, calvarium and extracranial soft tissues are  normal. Sinuses/Orbits: No fluid levels or advanced mucosal thickening of the visualized paranasal sinuses. No mastoid or middle ear effusion. The orbits are normal. ASPECTS [facility] Stroke Program Early CT Score) - Ganglionic level infarction (caudate, lentiform nuclei, internal capsule, insula, M1-M3 cortex): 7 - Supraganglionic infarction (M4-M6 cortex): 3 Total score (0-10 with 10 being normal): 10 IMPRESSION: 1. No acute intracranial abnormality. 2. ASPECTS is 10. These results were called by telephone at the time of interpretation on 02/05/2022 at 9:16 pm to provider Dorothea Glassman , who verbally acknowledged these results. Electronically Signed   By: Deatra Robinson M.D.   On: 02/05/2022 21:17  Assessment and Plan: No notes have been filed under this hospital service. Service: Hospitalist     Advance Care Planning:   Code Status: Prior   Consults: Neurology  Family Communication: none  Severity of Illness: The appropriate patient status for this patient is OBSERVATION. Observation status is judged to be reasonable and necessary in order to provide the required intensity of service to ensure the patient's safety. The patient's presenting symptoms, physical exam findings, and initial radiographic and laboratory data in the context of their medical condition is felt to place them at decreased risk for further clinical deterioration. Furthermore, it is anticipated that the patient will be medically stable for discharge from the hospital within 2 midnights of admission.   Author: Venora Maples, MD 02/05/2022 11:43 PM  For on call review [email or web address].

## 2022-02-05 NOTE — ED Provider Notes (Signed)
[facility] Provider Note    Event Date/Time   First MD Initiated Contact with Patient 02/05/22 2059     (approximate)   History   Code Stroke   HPI  Joshlyn Beadle Brannen is a 87 y.o. female who was out eating dinner and became dysphasic and apparently had trouble using her fork.  She was last known well at 1915 hrs. tonight.  She was brought in by ambulance just now at about 9 PM.  She remains unable to say more than 1 word clearly at a time.  She denies any other symptoms.  Past medical history significant for intracranial hemorrhage.      Physical Exam   Triage Vital Signs: ED Triage Vitals [02/05/22 2100]  Enc Vitals Group     BP (!) 210/74     Pulse Rate 86     Resp (!) 22     Temp (!) 97.4 F (36.3 C)     Temp Source Oral     SpO2 93 %     Weight      Height      Head Circumference      Peak Flow      Pain Score      Pain Loc      Pain Edu?      Excl. in GC?     Most recent vital signs: Vitals:   02/05/22 2115 02/05/22 2120  BP: (!) 184/76 (!) 179/71  Pulse: 83 82  Resp: 16 (!) 23  Temp:    SpO2: 91% 91%     General: Awake, alert says she is very anxious and is trembling Head normocephalic atraumatic Eyes pupils equal round reactive extraocular movements intact patient reports good visual fields Mouth no erythema or exudate CV:  Good peripheral perfusion.  Heart regular rate and rhythm no audible murmurs Resp:  Normal effort.  Lungs are clear Abd:  No distention.  Soft and nontender Extremities: No edema \\Neuro  cranial nerves II through XII are intact cerebellar finger-nose is normal motor strength is 5/5 throughout there is no drift patient does not report any numbness   ED Results / Procedures / Treatments   Labs (all labs ordered are listed, but only abnormal results are displayed) Labs Reviewed  CBC - Abnormal; Notable for the following components:      Result Value   RBC 3.69 (*)    Hemoglobin 11.7 (*)    All other  components within normal limits  COMPREHENSIVE METABOLIC PANEL - Abnormal; Notable for the following components:   BUN 38 (*)    Creatinine, Ser 1.17 (*)    GFR, Estimated 46 (*)    All other components within normal limits  ETHANOL  DIFFERENTIAL  PROTIME-INR  APTT  URINE DRUG SCREEN, QUALITATIVE ([facility] ONLY)  URINALYSIS, ROUTINE W REFLEX MICROSCOPIC  CBG MONITORING, ED     EKG     RADIOLOGY CT read by radiology reviewed and interpreted by me as negative.  PROCEDURES:  Critical Care performed: Critical care time 20 minutes.  This includes seeing the patient talking to EMS, looking at her CT scan and other studies and checking her again when she comes back from CT noting her improvement.  Additionally I spoke with teleneurology on the phone and him speaking with the hospitalist.  Procedures   MEDICATIONS ORDERED IN ED: Medications  labetalol (NORMODYNE) injection 5 mg (5 mg Intravenous Not Given 02/05/22 2114)     IMPRESSION / MDM / ASSESSMENT AND  PLAN / ED COURSE  I reviewed the triage vital signs and the nursing notes. ----------------------------------------- 9:07 PM on 02/05/2022 ----------------------------------------- Patient's blood pressures over 200.  Will give her some labetalol when she returns from CT if the pressure has not come down.  Note stroke is been called teleneurology has not answer just yet but it is very early. ----------------------------------------- 9:56 PM on 02/05/2022 ----------------------------------------- Teleneurology has seen the patient and per nurse feels this is a TIA patient has been improving rapidly with her speech.  Teleneurology then calls me and confirms. Differential diagnosis includes, but is not limited to, CVA, TIA, complex migraine, seizure Patient's presentation is most consistent with acute presentation with potential threat to life or bodily function.  The patient is on the cardiac monitor to evaluate for evidence of  arrhythmia and/or significant heart rate changes.  None has been seen      FINAL CLINICAL IMPRESSION(S) / ED DIAGNOSES   Final diagnoses:  TIA (transient ischemic attack)     Rx / DC Orders   ED Discharge Orders     None        Note:  This document was prepared using Dragon voice recognition software and may include unintentional dictation errors.   Arnaldo Natal, MD 02/05/22 2200

## 2022-02-05 NOTE — Consult Note (Signed)
[facility] Tele[facility]rology Consult Services   Patient Name:   Paula Hansen, Buehrle Date of Birth:   [birth year] Identification Number:   MRN - [id number] Date of Service:   02/05/2022 21:25:20  Diagnosis:       G45.9 - Transient cerebral ischemic attack, unspecified  Impression:      Probable TIA vs stroke favoring distal L MCA.     Discussed tPA as appears may have very mild aphasia. Patient feels this is her baseline and agreeable to not proceeding as does not feel these current symptoms are new or disabling for her. Clinically not consistent with LVO.    Recommend admission for the following w/u:  Diagnostic Testing: CTA H and Neck if not completed, MRI brain WO and 2D Echo  Medications:  ABCD score >4/NIH<4, will follow POINT/CHANCE with Plavix 300 mg load STAT once today, followed by Plavix 75 mg for 20 day starting tomorrow; start Aspirin 81 mg daily to be continued indefinitely    Start or continue high intensity statin (ex. Atorvastatin 80 mg)    Labs: UA, UDS, TROP, CK, TSH, B12, BNP, CBC, LIPID PANEL, and A1C.  Other:  Neuro checks Q4  Telemetry  No neuro contraindications to DVT PPX  Recommend fall precautions and seizure precautions.  PT/OT/SLP  Goal LDL <70 and HgA1c<7%  Encourage smoking cessation with nicotine patches if applicable    Permissive hypertension for 24 hrs (Give when necessary antihypertensive only if systolic blood pressure >220 mmHg or diastolic blood pressure >120 mmHg or signs of end organ damage) following guidelines SBP less than 180 following 24 hours, then less than 160 following 24 hours followed by goal of SBP less than    Will need bedside swallow eval prior to PO, if cannot tolerate Aspirin per rectum until able to tolerate PO    F/U PCP upon DC for continued secondary stroke prevention  Sign Out:       Discussed with Emergency Department  Provider    ------------------------------------------------------------------------------  Advanced Imaging: Advanced Imaging Deferred because:  Non-disabling symptoms as verified by the patient; no cortical signs so not consistent with LVO   Metrics: Last Known Well: Unknown [facility] Notification Time: 02/05/2022 21:23:55 Arrival Time: 02/05/2022 20:54:00 Stamp Time: 02/05/2022 21:25:20 Initial Response Time: 02/05/2022 21:27:37 Symptoms: Aphasia and BL UE weakness . Initial patient interaction: 02/05/2022 21:34:58 NIHSS Assessment Completed: 02/05/2022 21:37:19 Patient is not a candidate for Thrombolytic. Thrombolytic Medical Decision: 02/05/2022 21:37:19 Patient was not deemed candidate for Thrombolytic because of following reasons: No disabling symptoms.  CT head showed no acute hemorrhage or acute core infarct.  Primary Provider Notified of Diagnostic Impression and Management Plan on: 02/05/2022 21:57:23    ------------------------------------------------------------------------------  History of Present Illness: Patient is a 87 year old Female.  Patient was brought by EMS for symptoms of Aphasia and BL UE weakness . Patient arrived via EMS. Per nursing staff EMS reported that patient was eating dinner with family and suddenly was unable to speak and appeared to have bilateral upper extremity weakness as she was no longer able to feed herself. She does remember speech difficulty but states that this has resolved. Patient states that she just remembers being unable to eat her food but cannot further clarify. Upon arrival to the ER patient rapidly improved.  Patient states she is currently feeling well and not back to her baseline. Patient cannot recall when she was last normal reportedly per EMS around 1900.    Past Medical History:  Diabetes Mellitus  Medications:  No Anticoagulant use  No Antiplatelet use Reviewed EMR for current  medications  Allergies:  Reviewed  Social History: Smoking: No Alcohol Use: No Drug Use: No  Family History:  There is no family history of premature cerebrovascular disease pertinent to this consultation  ROS : 14 Points Review of Systems was performed and was negative except mentioned in HPI.  Past Surgical History: There Is No Surgical History Contributory To Today's Visit     Examination: BP(184/76), Pulse(86), Blood Glucose(84) 1A: Level of Consciousness - Alert; keenly responsive + 0 1B: Ask Month and Age - 1 Question Right + 1 1C: Blink Eyes & Squeeze Hands - Performs Both Tasks + 0 2: Test Horizontal Extraocular Movements - Normal + 0 3: Test Visual Fields - No Visual Loss + 0 4: Test Facial Palsy (Use Grimace if Obtunded) - Normal symmetry + 0 5A: Test Left Arm Motor Drift - No Drift for 10 Seconds + 0 5B: Test Right Arm Motor Drift - No Drift for 10 Seconds + 0 6A: Test Left Leg Motor Drift - No Drift for 5 Seconds + 0 6B: Test Right Leg Motor Drift - No Drift for 5 Seconds + 0 7: Test Limb Ataxia (FNF/Heel-Shin) - No Ataxia + 0 8: Test Sensation - Normal; No sensory loss + 0 9: Test Language/Aphasia - Mild-Moderate Aphasia: Some Obvious Changes, Without Significant Limitation + 1 10: Test Dysarthria - Normal + 0 11: Test Extinction/Inattention - No abnormality + 0  NIHSS Score: 2   Pre-Morbid Modified Rankin Scale: 0 Points = No symptoms at all  Spoke with : Dr. Darnelle Catalan  Patient/Family was informed the Neurology Consult would occur via TeleHealth consult by way of interactive audio and video telecommunications and consented to receiving care in this manner.   Patient is being evaluated for possible acute neurologic impairment and high probability of imminent or life-threatening deterioration. I spent total of 45 minutes providing care to this patient, including time for face to face visit via telemedicine, review of medical records, imaging studies and  discussion of findings with providers, the patient and/or family.   Dr Koren Bound   [facility] For Inpatient follow-up with [facility] physician please call [facility] [phone]. This is not an outpatient service. Post hospital discharge, please contact hospital directly.  Please do not communicate with [facility] physicians via secure chat. If you have any questions, Please contact [facility].

## 2022-02-05 NOTE — ED Triage Notes (Signed)
BIBA for code stroke. Per EMS LKW [id number]. Patient started having expressive aphasia and bilateral motor arm issues.

## 2022-02-05 NOTE — H&P (Signed)
History and Physical    Patient: Paula Hansen ZOX:[id number] DOB: [birth year] DOA: 02/05/2022 DOS: the patient was seen and examined on 02/06/2022 PCP: Gracelyn Nurse, MD  Patient coming from: Home  Chief Complaint:  Chief Complaint  Patient presents with   Code Stroke   HPI: Paula Hansen is a 87 y.o. female with medical history significant for type 2 diabetes, CKD, hypertension, hypothyroidism, who presents after an episode of word finding difficulty.  Patient reports that she was at the [employer] in [place] with her PCA and husband earlier today.  When they sat down to eat she suddenly could not speak clearly or make any sense.  This was very alarming to both her husband and her PCA and they called 911.  PCA was worried in particular because patient's husband had just had a stroke recently.  They estimate the episode lasted about 40 minutes and that she is now back to her baseline.  In the ED her vital signs were notable only for hypertension.  CMP and CBC were at her baseline.  UA was unremarkable.  UDS was normal.  CT head without contrast did not show any acute findings.  Neurology was consulted and concern for TIA versus stroke.  They recommended usual workup and she was admitted.  Review of Systems: As mentioned in the history of present illness. All other systems reviewed and are negative. Past Medical History:  Diagnosis Date   Arthritis    Diabetes mellitus    Diabetic neuropathy (HCC)    GERD (gastroesophageal reflux disease)    Hypercholesteremia    Hypertension    Hypothyroidism    Lumbar stenosis    L3-L4 left   PONV (postoperative nausea and vomiting)    Wears glasses    Wears partial dentures    lower   Past Surgical History:  Procedure Laterality Date   ABDOMINAL HYSTERECTOMY  1978   COLONOSCOPY     COLONOSCOPY W/ POLYPECTOMY     DILATION AND CURETTAGE OF UTERUS     JOINT REPLACEMENT     left knee October 2012.   KNEE ARTHROSCOPY W/  MENISCAL REPAIR     left knee   LUMBAR LAMINECTOMY/DECOMPRESSION MICRODISCECTOMY  08/17/2011   Procedure: LUMBAR LAMINECTOMY/DECOMPRESSION MICRODISCECTOMY 1 LEVEL;  Surgeon: Clydene Fake, MD;  Location: [facility];  Service: Neurosurgery;  Laterality: Right;  Right Lumbar Two-Three Laminectomy/Diskectomy   LUMBAR LAMINECTOMY/DECOMPRESSION MICRODISCECTOMY Left 10/22/2015   Procedure: Laminectomy and Foraminotomy - Lumbar three-lumbar four, left;  Surgeon: Tia Alert, MD;  Location: [facility];  Service: Neurosurgery;  Laterality: Left;   TOOTH EXTRACTION     Social History:  reports that she has quit smoking. Her smoking use included cigarettes. She has never used smokeless tobacco. She reports that she does not drink alcohol and does not use drugs.  Allergies  Allergen Reactions   Nickel Other (See Comments)    BREAKING OUT IN MOUTH IRRITATION RINGING IN THE EARS   Atorvastatin Other (See Comments)    Muscle Pain   Ezetimibe-Simvastatin Other (See Comments)    MYALGIAS   Pravastatin Other (See Comments)    MYALGIAS   Simvastatin Other (See Comments)    MYALGIAS   Amoxicillin Other (See Comments)    UNSPECIFIED REACTION    Codeine Other (See Comments)    Chest pain   Morphine And Related Nausea And Vomiting   Oxycodone Nausea And Vomiting   Shellfish Allergy Nausea And Vomiting   Sulfa Antibiotics  Nausea Only and Other (See Comments)    Upset stomach with burning     Family History  Problem Relation Age of Onset   Breast cancer Maternal Aunt        42's   Stroke Mother    Stroke Father    Cancer - Other Brother    Breast cancer Cousin     Prior to Admission medications   Medication Sig Start Date End Date Taking? Authorizing Provider  amLODipine-benazepril (LOTREL) 10-20 MG per capsule Take 1 capsule by mouth daily.   Yes [provider]  aspirin EC 81 MG tablet Take 81 mg by mouth every other day.    Yes [provider]  Biotin 2.5 MG TABS  Take 2,500 mcg by mouth daily.    Yes [provider]  Calcium Carbonate-Vit D-Min (CALTRATE PLUS PO) Take 1 tablet by mouth daily.   Yes [provider]  cetirizine (ZYRTEC) 10 MG tablet Take 10 mg by mouth daily.   Yes [provider]  Cholecalciferol (VITAMIN D-1000 MAX ST) 1000 units tablet Take 1,000 Units by mouth daily.    Yes [provider]  clopidogrel (PLAVIX) 75 MG tablet Take by mouth. 06/15/21 06/15/22 Yes [provider]  Coenzyme Q10 10 MG capsule Take 10 mg by mouth daily.   Yes [provider]  esomeprazole (NEXIUM) 40 MG capsule Take 40 mg by mouth daily before breakfast.   Yes [provider]  Flaxseed, Linseed, (FLAXSEED OIL) 1000 MG CAPS Take 1,000 mg by mouth daily.   Yes [provider]  gabapentin (NEURONTIN) 300 MG capsule Take 300 mg by mouth 3 (three) times daily.   Yes [provider]  glipiZIDE (GLUCOTROL XL) 10 MG 24 hr tablet Take 10 mg by mouth 2 (two) times daily.  08/02/15  Yes [provider]  levothyroxine (SYNTHROID, LEVOTHROID) 50 MCG tablet Take 50 mcg by mouth daily before breakfast.  08/19/15  Yes [provider]  metFORMIN (GLUCOPHAGE) 500 MG tablet Take 1,000 mg by mouth 2 (two) times daily with a meal.    Yes [provider]  Multiple Vitamin (MULTIVITAMIN WITH MINERALS) TABS Take 1 tablet by mouth daily.   Yes [provider]  pioglitazone (ACTOS) 30 MG tablet Take 1 tablet by mouth daily. 07/20/21  Yes [provider]  Probiotic Product (ALIGN PO) Take by mouth.   Yes [provider]  rosuvastatin (CRESTOR) 10 MG tablet Take 10 mg by mouth every other day.   Yes [provider]  vitamin E 400 UNIT capsule Take 400 Units by mouth daily.   Yes [provider]  naproxen sodium (ANAPROX) 220 MG tablet Take 220 mg by mouth 2 (two) times daily as needed (for pain).     [provider]    Physical  Exam: Vitals:   02/05/22 2145 02/05/22 2200 02/05/22 2245 02/05/22 2300  BP: (!) 177/75 (!) 181/76  (!) 161/70  Pulse: 84 85  80  Resp: (!) 25 14  17   Temp:      TempSrc:      SpO2: 96% 96%  99%  Weight:   70.1 kg   Height:   5\' 3"  (1.6 m)    Physical Exam Constitutional:      General: She is not in acute distress.    Appearance: Normal appearance. She is not ill-appearing, toxic-appearing or diaphoretic.  HENT:     Head: Normocephalic and atraumatic.     Mouth/Throat:  Mouth: Mucous membranes are moist.  Eyes:     General: No scleral icterus. Cardiovascular:     Rate and Rhythm: Normal rate and regular rhythm.     Heart sounds: Normal heart sounds.  Pulmonary:     Effort: Pulmonary effort is normal.     Breath sounds: Normal breath sounds.  Abdominal:     General: Abdomen is flat.     Palpations: Abdomen is soft.  Skin:    General: Skin is warm and dry.  Neurological:     General: No focal deficit present.     Mental Status: She is alert and oriented to person, place, and time.     Cranial Nerves: Cranial nerves 2-12 are intact. No cranial nerve deficit.     Motor: No weakness or pronator drift.  Psychiatric:        Mood and Affect: Mood normal.        Thought Content: Thought content normal.     Data Reviewed: Results for orders placed or performed during the hospital encounter of 02/05/22 (from the past 24 hour(s))  CBG monitoring, ED     Status: None   Collection Time: 02/05/22  9:00 PM  Result Value Ref Range   Glucose-Capillary 84 70 - 99 mg/dL  Ethanol     Status: None   Collection Time: 02/05/22  9:19 PM  Result Value Ref Range   Alcohol, Ethyl (B) <10 <10 mg/dL  Protime-INR     Status: None   Collection Time: 02/05/22  9:19 PM  Result Value Ref Range   Prothrombin Time 12.2 11.4 - 15.2 seconds   INR 0.9 0.8 - 1.2  APTT     Status: Abnormal   Collection Time: 02/05/22  9:19 PM  Result Value Ref Range   aPTT 37 (H) 24 - 36 seconds  CBC      Status: Abnormal   Collection Time: 02/05/22  9:19 PM  Result Value Ref Range   WBC 7.4 4.0 - 10.5 K/uL   RBC 3.69 (L) 3.87 - 5.11 MIL/uL   Hemoglobin 11.7 (L) 12.0 - 15.0 g/dL   HCT 27.0 35.0 - 09.3 %   MCV 100.0 80.0 - 100.0 fL   MCH 31.7 26.0 - 34.0 pg   MCHC 31.7 30.0 - 36.0 g/dL   RDW 81.8 29.9 - 37.1 %   Platelets 292 150 - 400 K/uL   nRBC 0.0 0.0 - 0.2 %  Differential     Status: None   Collection Time: 02/05/22  9:19 PM  Result Value Ref Range   Neutrophils Relative % 74 %   Neutro Abs 5.4 1.7 - 7.7 K/uL   Lymphocytes Relative 14 %   Lymphs Abs 1.0 0.7 - 4.0 K/uL   Monocytes Relative 11 %   Monocytes Absolute 0.8 0.1 - 1.0 K/uL   Eosinophils Relative 1 %   Eosinophils Absolute 0.1 0.0 - 0.5 K/uL   Basophils Relative 0 %   Basophils Absolute 0.0 0.0 - 0.1 K/uL   Immature Granulocytes 0 %   Abs Immature Granulocytes 0.02 0.00 - 0.07 K/uL  Comprehensive metabolic panel     Status: Abnormal   Collection Time: 02/05/22  9:19 PM  Result Value Ref Range   Sodium 139 135 - 145 mmol/L   Potassium 4.2 3.5 - 5.1 mmol/L   Chloride 107 98 - 111 mmol/L   CO2 23 22 - 32 mmol/L   Glucose, Bld 97 70 - 99 mg/dL   BUN 38 (H)  8 - 23 mg/dL   Creatinine, Ser 4.91 (H) 0.44 - 1.00 mg/dL   Calcium 9.4 8.9 - 79.1 mg/dL   Total Protein 7.5 6.5 - 8.1 g/dL   Albumin 4.0 3.5 - 5.0 g/dL   AST 21 15 - 41 U/L   ALT 14 0 - 44 U/L   Alkaline Phosphatase 63 38 - 126 U/L   Total Bilirubin 0.5 0.3 - 1.2 mg/dL   GFR, Estimated 46 (L) >60 mL/min   Anion gap 9 5 - 15  Urine Drug Screen, Qualitative     Status: None   Collection Time: 02/05/22 10:50 PM  Result Value Ref Range   Tricyclic, Ur Screen NONE DETECTED NONE DETECTED   Amphetamines, Ur Screen NONE DETECTED NONE DETECTED   MDMA (Ecstasy)Ur Screen NONE DETECTED NONE DETECTED   Cocaine Metabolite,Ur [place] NONE DETECTED NONE DETECTED   Opiate, Ur Screen NONE DETECTED NONE DETECTED   Phencyclidine (PCP) Ur S NONE DETECTED NONE DETECTED    Cannabinoid 50 Ng, Ur [place] NONE DETECTED NONE DETECTED   Barbiturates, Ur Screen NONE DETECTED NONE DETECTED   Benzodiazepine, Ur Scrn NONE DETECTED NONE DETECTED   Methadone Scn, Ur NONE DETECTED NONE DETECTED  Urinalysis, Routine w reflex microscopic Urine, Clean Catch     Status: Abnormal   Collection Time: 02/05/22 10:50 PM  Result Value Ref Range   Color, Urine STRAW (A) YELLOW   APPearance CLEAR (A) CLEAR   Specific Gravity, Urine 1.009 1.005 - 1.030   pH 6.0 5.0 - 8.0   Glucose, UA NEGATIVE NEGATIVE mg/dL   Hgb urine dipstick NEGATIVE NEGATIVE   Bilirubin Urine NEGATIVE NEGATIVE   Ketones, ur NEGATIVE NEGATIVE mg/dL   Protein, ur 30 (A) NEGATIVE mg/dL   Nitrite NEGATIVE NEGATIVE   Leukocytes,Ua TRACE (A) NEGATIVE   RBC / HPF 0-5 0 - 5 RBC/hpf   WBC, UA 0-5 0 - 5 WBC/hpf   Bacteria, UA RARE (A) NONE SEEN   Squamous Epithelial / LPF 0-5 0 - 5   CT HEAD CODE STROKE WO CONTRAST  Result Date: 02/05/2022 CLINICAL DATA:  Code stroke.  Expressive aphasia EXAM: CT HEAD WITHOUT CONTRAST TECHNIQUE: Contiguous axial images were obtained from the base of the skull through the vertex without intravenous contrast. RADIATION DOSE REDUCTION: This exam was performed according to the departmental dose-optimization program which includes automated exposure control, adjustment of the mA and/or kV according to patient size and/or use of iterative reconstruction technique. COMPARISON:  None Available. FINDINGS: Brain: There is no mass, hemorrhage or extra-axial collection. The size and configuration of the ventricles and extra-axial CSF spaces are normal. There is hypoattenuation of the periventricular white matter, most commonly indicating chronic ischemic microangiopathy. Vascular: No abnormal hyperdensity of the major intracranial arteries or dural venous sinuses. No intracranial atherosclerosis. Skull: The visualized skull base, calvarium and extracranial soft tissues are normal. Sinuses/Orbits: No fluid  levels or advanced mucosal thickening of the visualized paranasal sinuses. No mastoid or middle ear effusion. The orbits are normal. ASPECTS [facility] Stroke Program Early CT Score) - Ganglionic level infarction (caudate, lentiform nuclei, internal capsule, insula, M1-M3 cortex): 7 - Supraganglionic infarction (M4-M6 cortex): 3 Total score (0-10 with 10 being normal): 10 IMPRESSION: 1. No acute intracranial abnormality. 2. ASPECTS is 10. These results were called by telephone at the time of interpretation on 02/05/2022 at 9:16 pm to provider Dorothea Glassman , who verbally acknowledged these results. Electronically Signed   By: Deatra Robinson M.D.   On: 02/05/2022 21:17  Current Meds  Medication Sig   amLODipine-benazepril (LOTREL) 10-20 MG per capsule Take 1 capsule by mouth daily.   aspirin EC 81 MG tablet Take 81 mg by mouth every other day.    Biotin 2.5 MG TABS Take 2,500 mcg by mouth daily.    Calcium Carbonate-Vit D-Min (CALTRATE PLUS PO) Take 1 tablet by mouth daily.   cetirizine (ZYRTEC) 10 MG tablet Take 10 mg by mouth daily.   Cholecalciferol (VITAMIN D-1000 MAX ST) 1000 units tablet Take 1,000 Units by mouth daily.    clopidogrel (PLAVIX) 75 MG tablet Take by mouth.   Coenzyme Q10 10 MG capsule Take 10 mg by mouth daily.   esomeprazole (NEXIUM) 40 MG capsule Take 40 mg by mouth daily before breakfast.   Flaxseed, Linseed, (FLAXSEED OIL) 1000 MG CAPS Take 1,000 mg by mouth daily.   gabapentin (NEURONTIN) 300 MG capsule Take 300 mg by mouth 3 (three) times daily.   glipiZIDE (GLUCOTROL XL) 10 MG 24 hr tablet Take 10 mg by mouth 2 (two) times daily.    levothyroxine (SYNTHROID, LEVOTHROID) 50 MCG tablet Take 50 mcg by mouth daily before breakfast.    metFORMIN (GLUCOPHAGE) 500 MG tablet Take 1,000 mg by mouth 2 (two) times daily with a meal.    Multiple Vitamin (MULTIVITAMIN WITH MINERALS) TABS Take 1 tablet by mouth daily.   pioglitazone (ACTOS) 30 MG tablet Take 1 tablet by mouth daily.    Probiotic Product (ALIGN PO) Take by mouth.   rosuvastatin (CRESTOR) 10 MG tablet Take 10 mg by mouth every other day.   vitamin E 400 UNIT capsule Take 400 Units by mouth daily.    Assessment and Plan: Paula Hansen is a 87 y.o. female with medical history significant for type 2 diabetes, CKD, hypertension, hypothyroidism, who presents after an episode of word finding difficulty concerning for TIA.  * TIA (transient ischemic attack) Him is now completely resolved, workup per neurology. - MR brain - TTE - Lab workup per neuro - Telemetry - Continue aspirin 81 mg - Currently on rosuvastatin 10, has numerous allergies to other statins listed as myalgias, will increase to 40 and monitor - Continue Plavix  Known medical problems Hypertension-continue amlodipine, benazepril DM2-continue metformin, pioglitazone.  Hold glipizide GERD-continue PPI Neuropathy-continue gabapentin Hypothyroidism-continue Synthroid      Advance Care Planning:   Code Status: Full Code , discussed with patient  Consults: Neurology  Family Communication: none  Severity of Illness: The appropriate patient status for this patient is OBSERVATION. Observation status is judged to be reasonable and necessary in order to provide the required intensity of service to ensure the patient's safety. The patient's presenting symptoms, physical exam findings, and initial radiographic and laboratory data in the context of their medical condition is felt to place them at decreased risk for further clinical deterioration. Furthermore, it is anticipated that the patient will be medically stable for discharge from the hospital within 2 midnights of admission.   Author: Venora MaplesMatthew M Keller Bounds, MD 02/06/2022 12:06 AM  For on call review [email or web address].

## 2022-02-05 NOTE — ED Notes (Signed)
Passed swallow screen

## 2022-02-06 ENCOUNTER — Observation Stay (HOSPITAL_BASED_OUTPATIENT_CLINIC_OR_DEPARTMENT_OTHER)
Admit: 2022-02-06 | Discharge: 2022-02-06 | Disposition: A | Discharge status: Home / Self Care | Payer: Medicare Other | Attending: Family Medicine | Admitting: Family Medicine

## 2022-02-06 ENCOUNTER — Encounter: Payer: Self-pay | Admitting: Radiology

## 2022-02-06 ENCOUNTER — Observation Stay: Payer: Medicare Other

## 2022-02-06 ENCOUNTER — Other Ambulatory Visit: Payer: Self-pay

## 2022-02-06 DIAGNOSIS — Z789 Other specified health status: Secondary | ICD-10-CM | POA: Insufficient documentation

## 2022-02-06 DIAGNOSIS — G459 Transient cerebral ischemic attack, unspecified: Secondary | ICD-10-CM | POA: Diagnosis not present

## 2022-02-06 LAB — LIPID PANEL
Cholesterol: 121 mg/dL (ref 0–200)
HDL: 55 mg/dL (ref >40)
LDL Cholesterol: 53 mg/dL (ref 0–99)
Total CHOL/HDL Ratio: 2.2 RATIO
Triglycerides: 66 mg/dL (ref <150)
VLDL: 13 mg/dL (ref 0–40)

## 2022-02-06 LAB — ECHOCARDIOGRAM COMPLETE
AR max vel: 1.96 cm2
AV Peak grad: 10.2 mmHg
Ao pk vel: 1.6 m/s
Area-P 1/2: 6.37 cm2
Calc EF: 64 %
Height: 63 in
S' Lateral: 2.2 cm
Single Plane A2C EF: 60.7 %
Single Plane A4C EF: 67.2 %
Weight: 2472 oz

## 2022-02-06 LAB — CK: Total CK: 81 U/L (ref 38–234)

## 2022-02-06 LAB — GLUCOSE, CAPILLARY: Glucose-Capillary: 120 mg/dL — ABNORMAL HIGH (ref 70–99)

## 2022-02-06 LAB — TSH: TSH: 1.984 u[IU]/mL (ref 0.350–4.500)

## 2022-02-06 LAB — BRAIN NATRIURETIC PEPTIDE: B Natriuretic Peptide: 88.4 pg/mL (ref 0.0–100.0)

## 2022-02-06 LAB — VITAMIN B12: Vitamin B-12: 240 pg/mL (ref 180–914)

## 2022-02-06 MED ORDER — ROSUVASTATIN CALCIUM 20 MG PO TABS
40.0000 mg | ORAL_TABLET | ORAL | Status: DC
  Administered 2022-02-06: 40 mg via ORAL
  Filled 2022-02-06 (×2): qty 2

## 2022-02-06 MED ORDER — GABAPENTIN 300 MG PO CAPS
300.0000 mg | ORAL_CAPSULE | Freq: Three times a day (TID) | ORAL | Status: DC
  Administered 2022-02-06 – 2022-02-07 (×4): 300 mg via ORAL
  Filled 2022-02-06 (×4): qty 1

## 2022-02-06 MED ORDER — ACETAMINOPHEN 160 MG/5ML PO SOLN: 650.0000 mg | ORAL | Status: DC | PRN

## 2022-02-06 MED ORDER — METFORMIN HCL ER 500 MG PO TB24
1000.0000 mg | ORAL_TABLET | Freq: Two times a day (BID) | ORAL | Status: DC
  Administered 2022-02-06 – 2022-02-07 (×3): 1000 mg via ORAL
  Filled 2022-02-06 (×4): qty 2

## 2022-02-06 MED ORDER — LEVOTHYROXINE SODIUM 50 MCG PO TABS
50.0000 ug | ORAL_TABLET | Freq: Every day | ORAL | Status: DC
  Administered 2022-02-06 – 2022-02-07 (×2): 50 ug via ORAL
  Filled 2022-02-06 (×2): qty 1

## 2022-02-06 MED ORDER — ASPIRIN 81 MG PO TBEC
81.0000 mg | DELAYED_RELEASE_TABLET | ORAL | Status: DC
  Administered 2022-02-06: 81 mg via ORAL
  Filled 2022-02-06: qty 1

## 2022-02-06 MED ORDER — DIAZEPAM 5 MG/ML IJ SOLN
2.5000 mg | Freq: Once | INTRAMUSCULAR | Status: AC
  Administered 2022-02-06: 2.5 mg via INTRAVENOUS
  Filled 2022-02-06: qty 2

## 2022-02-06 MED ORDER — STROKE: EARLY STAGES OF RECOVERY BOOK: Freq: Once | Status: AC

## 2022-02-06 MED ORDER — CLOPIDOGREL BISULFATE 75 MG PO TABS
75.0000 mg | ORAL_TABLET | Freq: Every day | ORAL | Status: DC
  Administered 2022-02-06 – 2022-02-07 (×2): 75 mg via ORAL
  Filled 2022-02-06 (×2): qty 1

## 2022-02-06 MED ORDER — ADULT MULTIVITAMIN W/MINERALS CH
1.0000 | ORAL_TABLET | Freq: Every day | ORAL | Status: DC
  Administered 2022-02-06 – 2022-02-07 (×2): 1 via ORAL
  Filled 2022-02-06 (×2): qty 1

## 2022-02-06 MED ORDER — IOHEXOL 350 MG/ML SOLN
100.0000 mL | Freq: Once | INTRAVENOUS | Status: AC | PRN
  Administered 2022-02-06: 100 mL via INTRAVENOUS

## 2022-02-06 MED ORDER — ACETAMINOPHEN 325 MG PO TABS: 650.0000 mg | ORAL_TABLET | ORAL | Status: DC | PRN

## 2022-02-06 MED ORDER — PANTOPRAZOLE SODIUM 40 MG PO TBEC
80.0000 mg | DELAYED_RELEASE_TABLET | Freq: Every day | ORAL | Status: DC
  Administered 2022-02-06: 80 mg via ORAL
  Filled 2022-02-06: qty 2

## 2022-02-06 MED ORDER — PIOGLITAZONE HCL 30 MG PO TABS
30.0000 mg | ORAL_TABLET | Freq: Every day | ORAL | Status: DC
  Administered 2022-02-06 – 2022-02-07 (×2): 30 mg via ORAL
  Filled 2022-02-06 (×2): qty 1

## 2022-02-06 MED ORDER — LORATADINE 10 MG PO TABS
10.0000 mg | ORAL_TABLET | Freq: Every day | ORAL | Status: DC
  Administered 2022-02-06 – 2022-02-07 (×2): 10 mg via ORAL
  Filled 2022-02-06 (×2): qty 1

## 2022-02-06 MED ORDER — ENOXAPARIN SODIUM 40 MG/0.4ML IJ SOSY
40.0000 mg | PREFILLED_SYRINGE | INTRAMUSCULAR | Status: DC
  Administered 2022-02-06: 40 mg via SUBCUTANEOUS
  Filled 2022-02-06 (×2): qty 0.4

## 2022-02-06 MED ORDER — BENAZEPRIL HCL 20 MG PO TABS
20.0000 mg | ORAL_TABLET | Freq: Every day | ORAL | Status: DC
  Filled 2022-02-06: qty 1

## 2022-02-06 MED ORDER — AMLODIPINE BESYLATE 5 MG PO TABS: 10.0000 mg | ORAL_TABLET | Freq: Every day | ORAL | Status: DC

## 2022-02-06 MED ORDER — ACETAMINOPHEN 650 MG RE SUPP: 650.0000 mg | RECTAL | Status: DC | PRN

## 2022-02-06 NOTE — Progress Notes (Signed)
OT Cancellation Note  Patient Details Name: Paula Hansen MRN: [id number] DOB: 1934/08/15   Cancelled Treatment:    Reason Eval/Treat Not Completed: Other (comment);Patient at procedure or test/ unavailable (pt with echo in room, OT will reattempt as able.Oleta Mouse, OTD OTR/L  02/06/22, 11:20 AM

## 2022-02-06 NOTE — ED Notes (Signed)
Patient transferred to MRI 

## 2022-02-06 NOTE — ED Notes (Addendum)
Report received from [place], [place]

## 2022-02-06 NOTE — Progress Notes (Signed)
  Echocardiogram 2D Echocardiogram has been performed.  Paula Hansen 02/06/2022, 11:43 AM

## 2022-02-06 NOTE — ED Notes (Signed)
Code stroke called in field with ems /patient arrived @ 22:54

## 2022-02-06 NOTE — TOC Progression Note (Addendum)
Transition of Care [name]) - Progression Note    Patient Details  Name: Kerrilyn Azbill Mcconaughy MRN: [id number] Date of Birth: April 12, 1934  Transition of Care [facility]) CM/SW Contact  Bing Quarry, RN Phone Number: 02/06/2022, 3:30 PM  Clinical Narrative: 12/16: Spoke with son regarding HH agency, no preference, but unable to secure agency so far today. Son indicates that transportation to home via one of the aids will have to be arranged so that someone remains with spouse and that will have to be arranged via the private care company for tomorrow. He also expressed he did not feel she would be safe going home this late when care givers leave in just a few hours. Care givers are there between 10 am to 8 pm most days. He also was agreeable to contacting patient's PCP on Monday to set up [facility] if RN CM unable to secure agency by tomorrow. Updated provider and documented barriers to discharge to to unsafe home situation till aides available tomorrow. Updated provider and DC planned for tomorrow. Gabriel Cirri RN CM       Barriers to Discharge: No Home Care Agency will accept this patient, Unsafe home situation  Expected Discharge Plan and Services                                                 Social Determinants of Health (SDOH) Interventions Food Insecurity Interventions: Patient Refused Housing Interventions: Patient Refused Utilities Interventions: Patient Refused  Readmission Risk Interventions     No data to display

## 2022-02-06 NOTE — ED Notes (Signed)
Telespecialist  called  spoke with Paula Hansen

## 2022-02-06 NOTE — ED Notes (Signed)
Patient transferred back from MRI 

## 2022-02-06 NOTE — ED Notes (Signed)
This is day shift RN who will be taking report

## 2022-02-06 NOTE — Assessment & Plan Note (Addendum)
Him is now completely resolved, workup per neurology. - MR brain - TTE - Lab workup per neuro - Telemetry - Continue aspirin 81 mg - Currently on rosuvastatin 10, has numerous allergies to other statins listed as myalgias, will increase to 40 and monitor - Continue Plavix

## 2022-02-06 NOTE — Evaluation (Signed)
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 Pt is oriented to self, place, date; not oriented to situation. Noted STM deficits, however fair-good safety awareness while participating in tasks.. Per pt son on the phone, pt and husband have [facility] care 10-8 daily who assist with IADLs. Pt has had many falls recently, declining to use a device. Pt presents with deficits in balance, cognition affecting safe and optimal ADL completion. Recommend discharge home with HHOT to address functional deficits. OT will continue to follow acutely.      Recommendations for follow up therapy are one component of a multi-disciplinary discharge planning process, led by the attending physician.  Recommendations may be updated based on patient status, additional functional criteria and insurance authorization.   Follow Up Recommendations  Home health OT     Assistance Recommended at Discharge Frequent or constant Supervision/Assistance  Patient can return home with the following A little help with walking and/or transfers;A little help with bathing/dressing/bathroom;Assistance with cooking/housework;Direct supervision/assist for medications management;Direct supervision/assist for financial management    Functional Status Assessment  Patient has had a recent decline in their functional status and demonstrates the ability to make significant improvements in function in a reasonable and predictable amount of time.  Equipment Recommendations  None recommended by OT (further assessment,  encouragement for AE use for falls prevention)    Recommendations for Other Services       Precautions / Restrictions Precautions Precautions: Fall Restrictions Weight Bearing Restrictions: No      Mobility Bed Mobility Overal bed mobility: Needs Assistance Bed Mobility: Supine to Sit, Sit to Supine     Supine to sit: Supervision Sit to supine: Supervision        Transfers Overall transfer level: Needs assistance   Transfers: Sit to/from Stand Sit to Stand: Supervision           General transfer comment: intermittent vcs for safety      Balance Overall balance assessment: Needs assistance Sitting-balance support: Feet supported Sitting balance-Leahy Scale: Good     Standing balance support: No upper extremity supported Standing balance-Leahy Scale: Fair                             ADL either performed or assessed with clinical judgement   ADL Overall ADL's : Needs assistance/impaired Eating/Feeding: Set up;Sitting   Grooming: Wash/dry hands;Wash/dry face;Standing;Supervision/safety           Upper Body Dressing : Minimal assistance;Sitting   Lower Body Dressing: Minimal assistance;Sit to/from stand Lower Body Dressing Details (indicate cue type and reason): shoes Toilet Transfer: Min guard;Ambulation;Cueing for safety;Regular Toilet   Toileting- Clothing Manipulation and Hygiene: Supervision/safety;Sit to/from stand       Functional mobility during ADLs: Min guard;Cueing for sequencing;Cueing for safety (1 LOB, able to self correct)       Vision Patient Visual Report: No change from baseline       Perception     Praxis      Pertinent Vitals/Pain Pain Assessment Pain Assessment: No/denies pain     Hand Dominance  Extremity/Trunk Assessment Upper Extremity Assessment Upper Extremity Assessment: Overall WFL for tasks assessed   Lower Extremity Assessment Lower Extremity Assessment: Generalized weakness    Cervical / Trunk Assessment Cervical / Trunk Assessment: Normal   Communication Communication Communication: No difficulties   Cognition Arousal/Alertness: Awake/alert   Overall Cognitive Status: No family/caregiver present to determine baseline cognitive functioning Area of Impairment: Safety/judgement, Awareness, Problem solving, Memory                     Memory: Decreased short-term memory   Safety/Judgement: Decreased awareness of deficits Awareness: Emergent Problem Solving: Decreased initiation, Requires verbal cues, Requires tactile cues       General Comments  pt on RA, spo2 >90% throughout    Exercises     Shoulder Instructions      Home Living Family/patient expects to be discharged to:: Private residence Living Arrangements: Spouse/significant other Available Help at Discharge: Family Type of Home: House Home Access: Stairs to enter Technical brewer of Steps: 3 Entrance Stairs-Rails: Right Home Layout: Able to live on main level with bedroom/bathroom;Two level     Bathroom Shower/Tub: Tub/shower unit         Home Equipment: Advice worker (2 wheels);Cane - single point   Additional Comments: sits in in the tub      Prior Functioning/Environment Prior Level of Function : Needs assist             Mobility Comments: amb with no AD; son reports frequent falls ADLs Comments: [place] care comes from 10-8 every day to assist with IADLs per son; pt reports she generally gets dressed on her own        OT Problem List: Decreased strength;Decreased activity tolerance;Impaired balance (sitting and/or standing);Decreased knowledge of use of DME or AE      OT Treatment/Interventions: Self-care/ADL training;Therapeutic exercise;Energy conservation;DME and/or AE instruction;Therapeutic activities;Patient/family education;Balance training    OT Goals(Current goals can be found in the care plan section) Acute Rehab OT  Goals Patient Stated Goal: participate in home health OT OT Goal Formulation: With patient/family (son on the phone) Time For Goal Achievement: 02/20/22 Potential to Achieve Goals: Good ADL Goals Pt Will Perform Grooming: with modified independence;standing Pt Will Perform Lower Body Dressing: sit to/from stand;with modified independence Pt Will Transfer to Toilet: with modified independence;ambulating Pt Will Perform Toileting - Clothing Manipulation and hygiene: with modified independence;sit to/from stand  OT Frequency: Min 2X/week    Co-evaluation              AM-PAC OT "6 Clicks" Daily Activity     Outcome Measure Help from another person eating meals?: None Help from another person taking care of personal grooming?: None Help from another person toileting, which includes using toliet, bedpan, or urinal?: A Little Help from another person bathing (including washing, rinsing, drying)?: A Little Help from another person to put on and taking off regular upper body clothing?: A Little Help from another person to put on and taking off regular lower body clothing?: A Little 6 Click Score: 20   End of Session Nurse Communication: Mobility status  Activity Tolerance: Patient tolerated treatment well Patient left: in chair;with call bell/phone within reach;with chair alarm set  OT Visit Diagnosis: Unsteadiness on feet (R26.81);History of falling (Z91.81)                Time: [id number] OT Time Calculation (min): 19 min Charges:  OT General Charges $OT Visit: 1 Visit OT Evaluation $OT  Eval Low Complexity: 1 Low Shanon Payor, OTD OTR/L  02/06/22, 1:24 PM

## 2022-02-06 NOTE — Care Plan (Addendum)
Pre-rounds chart review and preliminary plan / goals for today: Significant overnight events per chart: none Significant new findings on labs/other diagnostics: MRI brain results - no acute stroke findings  Dispo plan: obs, anticipate discharge later today or tomorrow Pending/Plan:  holding antihypertensives this morning to allow permissive HTN and plan resume tomorrow / later today request neurology clearance prior to discharge and input re: continued cardiac monitoring outpatient await Echo results await CTA head/neck results     Please feel free to reach out via secure chat in Epic for non-urgent issues. Please page for urgent matters!  Notes will be updated after rounds to include full progress note / discharge note as appropriate.

## 2022-02-06 NOTE — Progress Notes (Signed)
PROGRESS NOTE    Paula Footmaneggy J Hesler   ZOX:[id number] DOB: 13-Feb-1935  DOA: 02/05/2022 Date of Service: 02/06/22 PCP: Gracelyn NurseJohnston, John D, MD     Brief Narrative / Hospital Course:  Paula Hansen is a 87 y.o. female with medical history significant for type 2 diabetes, CKD, hypertension, hypothyroidism, who presents after an episode of word finding difficulty, this episode lasted about 40 minutes and that she was back to her baseline by time of arrival to ED. Similar episode earlier this year, saw PCP 06/10/21 for several episodes of speech difficulty over course of previous week. Kept on ASA, statin. MRI 06/12/21 acute/subacute infarct L temporal lobe and L lateral occipital lobe. MRA head/neck 07/15/21 no flow L vertebral artery likely chronic, irregular basilar artery, irregular R A1 ACA w/ minimal flow R A2 ACA. Neurology office visit 06/25/21 continue rosuvastatin 20, monitor home BP, continue Plavix 75 and ASA 81. Cardiology office visit 07/13/21 echo and 72h Holter.  Follows w/ vascular surgery for carotid stenosis and subclavian artery stenosis, recent visit w/ Dr Gilda CreaseSchnier 01/25/22, see note for full details but recommended medical management and follow in 12 months w/ duplex US 12/15: In the ED her vital signs were notable only for hypertension.  CMP and CBC were at her baseline.  UA was unremarkable.  UDS was normal.  CT head without contrast did not show any acute findings.  Neurology was consulted and concern for TIA versus stroke.  They recommended usual workup (MRI brain wo contrast, CTA H/N, Echo, telemetry) and she was admitted for probable TIA vs stroke favoring distal L MCA. Rx Plavix 300 mg loading dose, follow w/ Plavix 75 mg 20 days and ASA 81 mg indefinitely.  12/16: see below for CTA H/N results and echo results - known concerns w/ stenotic vasculature. Stable but unable to go home at this time d/t no ride and family not able to help until tomorrow.   Consultants:  Neurology    Procedures: none      ASSESSMENT & PLAN:   Principal Problem:   TIA (transient ischemic attack) Active Problems:   Known medical problems   TIA (transient ischemic attack) MRI brain --> "No acute intracranial abnormality. 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, right thalamic and left occipital infarcts" vs MRI brain 05/2021  "posterior left temporal lobe and lateral left occipital lobe, consistent with acute to subacute infarcts." CTA H/N --> see below, high grade L subclavian stenosis, 50% stenosis L vertebral artery level C4-5, chronic occlusion L vertebral artery level C2, stable high grade stenosis/occlusion R A1, stable high grade stenosis distal R V4 and proximal basilar artery, small ICA terminus aneurysm 3mm TTE --> LVEF 60-65, no RWMA, moderate LVH, G1DD Telemetry Lipids --> LDL 53 Bedside swallow study --> passed Permissive HTN x24h - per neurology "Permissive hypertension for 24 hrs (Give when necessary antihypertensive only if systolic blood pressure >220 mmHg or diastolic blood pressure >120 mmHg or signs of end organ damage) following guidelines SBP less than 180 following 24 hours, then less than 160 following 24 hours followed by goal of SBP less than --> held home antihypertensives  ?cardiac event monitor per neurology  Continue Plavix total 21 days vs indefinite - await neurology recs  Continue aspirin 81 mg indefinitely Currently on rosuvastatin 10, has numerous allergies to other statins listed as myalgias, will increase to 40 and monitor  Known medical problems Hypertension - holding home amlodipine, benazepril this morning to allow permissive HTN as above  DM2 - continue metformin, pioglitazone.  Hold glipizide GERD - continue PPI Neuropathy - continue gabapentin Hypothyroidism - continue Synthroid    DVT prophylaxis: enoxaparin Pertinent IV fluids/nutrition: no continuous IV fluids Central lines / invasive devices: none  Code Status: FULL CODE - has advanced  directive on file w/ [facility] designated, dated 2018 Family Communication: spoke w/ son, Onalee Hua, 02/06/22 12:53 PM all questions answered  Disposition: observation TOC needs: home health Barriers to discharge / significant pending items: pending results and neuro eval and later TOC confirms unable to go home at this time d/t no ride and family not able to help until tomorrow unsafe discharge for now but anticipate safe discharge tomorrow morning.              Subjective:  Patient reports no complaints, confirms she feels fine right now.  Denies CP/SOB.  Pain controlled.  Denies new weakness.  Tolerating diet.  Reports no concerns w/ urination/defecation.       Objective Findings:  Vitals:   02/06/22 0730 02/06/22 0800 02/06/22 0928 02/06/22 1327  BP: (!) 140/97 (!) 140/95 125/89 133/86  Pulse:  92 93 89  Resp: (!) 29 (!) Temp:   98.2 F (36.8 C) 97.7 F (36.5 C)  TempSrc:      SpO2:  99% 98% 90%  Weight:      Height:        Intake/Output Summary (Last 24 hours) at 02/06/2022 1606 Last data filed at 02/06/2022 1610 Gross per 24 hour  Intake --  Output 300 ml  Net -300 ml   Filed Weights   02/05/22 2245  Weight: 70.1 kg    Examination:  Constitutional:  VS as above General Appearance: alert, well-developed, well-nourished, NAD Respiratory: Normal respiratory effort No wheeze No rhonchi No rales Cardiovascular: S1/S2 normal No murmur RRR Trace lower extremity edema Gastrointestinal: No tenderness Musculoskeletal:  No clubbing/cyanosis of digits Symmetrical movement in all extremities Stands fine but has unsteady walk Neurological: No cranial nerve deficit on limited exam Alert Psychiatric: Normal judgment/insight Normal mood and affect       Scheduled Medications:   aspirin EC  81 mg Oral QODAY   clopidogrel  75 mg Oral Daily   enoxaparin (LOVENOX) injection  40 mg Subcutaneous Q24H   gabapentin  300 mg Oral TID    levothyroxine  50 mcg Oral Q0600   loratadine  10 mg Oral Daily   metFORMIN  1,000 mg Oral BID WC   multivitamin with minerals  1 tablet Oral Daily   pantoprazole  80 mg Oral Q1200   pioglitazone  30 mg Oral Daily   rosuvastatin  40 mg Oral QODAY    Continuous Infusions:   PRN Medications:  acetaminophen **OR** acetaminophen (TYLENOL) oral liquid 160 mg/5 mL **OR** acetaminophen  Antimicrobials:  Anti-infectives (From admission, onward)    None           Data Reviewed: I have personally reviewed following labs and imaging studies  CBC: Recent Labs  Lab 02/05/22 2119  WBC 7.4  NEUTROABS 5.4  HGB 11.7*  HCT 36.9  MCV 100.0  PLT 292   Basic Metabolic Panel: Recent Labs  Lab 02/05/22 2119  NA 139  K 4.2  CL 107  CO2 23  GLUCOSE 97  BUN 38*  CREATININE 1.17*  CALCIUM 9.4   GFR: Estimated Creatinine Clearance: 33 mL/min (A) (by C-G formula based on SCr of 1.17 mg/dL (H)). Liver Function Tests: Recent Labs  Lab 02/05/22 2119  AST 21  ALT 14  ALKPHOS 63  BILITOT 0.5  PROT 7.5  ALBUMIN 4.0   No results for input(s): "LIPASE", "AMYLASE" in the last 168 hours. No results for input(s): "AMMONIA" in the last 168 hours. Coagulation Profile: Recent Labs  Lab 02/05/22 2119  INR 0.9   Cardiac Enzymes: Recent Labs  Lab 02/06/22 0437  CKTOTAL 81   BNP (last 3 results) No results for input(s): "PROBNP" in the last 8760 hours. HbA1C: No results for input(s): "HGBA1C" in the last 72 hours. CBG: Recent Labs  Lab 02/05/22 2100  GLUCAP 84   Lipid Profile: Recent Labs    02/06/22 0437  CHOL 121  HDL 55  LDLCALC 53  TRIG 66  CHOLHDL 2.2   Thyroid Function Tests: Recent Labs    02/06/22 0437  TSH 1.984   Anemia Panel: Recent Labs    02/06/22 0437  VITAMINB12 240   Most Recent Urinalysis On File:     Component Value Date/Time   COLORURINE STRAW (A) 02/05/2022 2250   APPEARANCEUR CLEAR (A) 02/05/2022 2250   LABSPEC 1.009  02/05/2022 2250   PHURINE 6.0 02/05/2022 2250   GLUCOSEU NEGATIVE 02/05/2022 2250   HGBUR NEGATIVE 02/05/2022 2250   BILIRUBINUR NEGATIVE 02/05/2022 2250   KETONESUR NEGATIVE 02/05/2022 2250   PROTEINUR 30 (A) 02/05/2022 2250   UROBILINOGEN 0.2 08/17/2011 0929   NITRITE NEGATIVE 02/05/2022 2250   LEUKOCYTESUR TRACE (A) 02/05/2022 2250   Sepsis Labs: @LABRCNTIP (procalcitonin:4,lacticidven:4)  No results found for this or any previous visit (from the past 240 hour(s)).       Radiology Studies: ECHOCARDIOGRAM COMPLETE  Result Date: 02/06/2022    ECHOCARDIOGRAM REPORT   Patient Name:   DONNITA FARINA Connell Date of Exam: 02/06/2022 Medical Rec #:  02/08/2022    Height:       63.0 in Accession #:    [id number]   Weight:       154.5 lb Date of Birth:  Nov 26, 1934    BSA:          1.733 m Patient Age:    87 years     BP:           123/81 mmHg Patient Gender: F            HR:           96 bpm. Exam Location:  [facility] Procedure: 2D Echo Indications:     TIA G45.9  History:         Patient has no prior history of Echocardiogram examinations.  Sonographer:     10/15/1934 RDCS Referring Phys:  [place]ton Mam MATTHEW M ECKSTAT Diagnosing Phys: [id number] MD IMPRESSIONS  1. Left ventricular ejection fraction, by estimation, is 60 to 65%. The left ventricle has normal function. The left ventricle has no regional wall motion abnormalities. There is moderate left ventricular hypertrophy. Left ventricular diastolic parameters are consistent with Grade I diastolic dysfunction (impaired relaxation).  2. Right ventricular systolic function is normal. The right ventricular size is normal.  3. The mitral valve is normal in structure. Mild mitral valve regurgitation. No evidence of mitral stenosis.  4. The aortic valve is normal in structure. Aortic valve regurgitation is not visualized. Aortic valve sclerosis/calcification is present, without any evidence of aortic stenosis.  5. The inferior vena cava is normal in size with  greater than 50% respiratory variability, suggesting right atrial pressure of 3 mmHg. FINDINGS  Left Ventricle: Left ventricular ejection fraction, by estimation, is 60 to 65%. The left ventricle  has normal function. The left ventricle has no regional wall motion abnormalities. The left ventricular internal cavity size was normal in size. There is  moderate left ventricular hypertrophy. Left ventricular diastolic parameters are consistent with Grade I diastolic dysfunction (impaired relaxation). Right Ventricle: The right ventricular size is normal. No increase in right ventricular wall thickness. Right ventricular systolic function is normal. Left Atrium: Left atrial size was normal in size. Right Atrium: Right atrial size was normal in size. Pericardium: There is no evidence of pericardial effusion. Mitral Valve: The mitral valve is normal in structure. Mild mitral annular calcification. Mild mitral valve regurgitation. No evidence of mitral valve stenosis. Tricuspid Valve: The tricuspid valve is normal in structure. Tricuspid valve regurgitation is mild . No evidence of tricuspid stenosis. Aortic Valve: The aortic valve is normal in structure. Aortic valve regurgitation is not visualized. Aortic valve sclerosis/calcification is present, without any evidence of aortic stenosis. Aortic valve peak gradient measures 10.2 mmHg. Pulmonic Valve: The pulmonic valve was normal in structure. Pulmonic valve regurgitation is not visualized. No evidence of pulmonic stenosis. Aorta: The aortic root is normal in size and structure. Venous: The inferior vena cava is normal in size with greater than 50% respiratory variability, suggesting right atrial pressure of 3 mmHg. IAS/Shunts: No atrial level shunt detected by color flow Doppler.  LEFT VENTRICLE PLAX 2D LVIDd:         3.50 cm     Diastology LVIDs:         2.20 cm     LV e' medial:    5.87 cm/s LV PW:         1.60 cm     LV E/e' medial:  25.6 LV IVS:        1.40 cm     LV e'  lateral:   5.66 cm/s LVOT diam:     2.00 cm     LV E/e' lateral: 26.5 LV SV:         60 LV SV Index:   35 LVOT Area:     3.14 cm  LV Volumes (MOD) LV vol d, MOD A2C: 47.1 ml LV vol d, MOD A4C: 40.6 ml LV vol s, MOD A2C: 18.5 ml LV vol s, MOD A4C: 13.3 ml LV SV MOD A2C:     28.6 ml LV SV MOD A4C:     40.6 ml LV SV MOD BP:      28.3 ml RIGHT VENTRICLE RV Basal diam:  2.20 cm RV S prime:     13.60 cm/s LEFT ATRIUM             Index        RIGHT ATRIUM          Index LA diam:        2.80 cm 1.62 cm/m   RA Area:     9.34 cm LA Vol (A2C):   46.9 ml 27.07 ml/m  RA Volume:   16.90 ml 9.75 ml/m LA Vol (A4C):   29.6 ml 17.08 ml/m LA Biplane Vol: 39.9 ml 23.03 ml/m  AORTIC VALVE                 PULMONIC VALVE AV Area (Vmax): 1.96 cm     PV Vmax:       1.07 m/s AV Vmax:        160.00 cm/s  PV Peak grad:  4.6 mmHg AV Peak Grad:   10.2 mmHg LVOT Vmax:      99.70  cm/s LVOT Vmean:     67.700 cm/s LVOT VTI:       0.191 m  AORTA Ao Root diam: 3.20 cm Ao Asc diam:  3.20 cm MITRAL VALVE MV Area (PHT): 6.37 cm     SHUNTS MV Decel Time: 119 msec     Systemic VTI:  0.19 m MV E velocity: 150.00 cm/s  Systemic Diam: 2.00 cm Julien Nordmann MD Electronically signed by Julien Nordmann MD Signature Date/Time: 02/06/2022/12:27:25 PM    Final    CT ANGIO HEAD NECK W WO CM W PERF  Result Date: 02/06/2022 CLINICAL DATA:  Code stroke.  MRI demonstrated no acute infarcts. EXAM: CT ANGIOGRAPHY HEAD AND NECK TECHNIQUE: Multidetector CT imaging of the head and neck was performed using the standard protocol during bolus administration of intravenous contrast. Multiplanar CT image reconstructions and MIPs were obtained to evaluate the vascular anatomy. Carotid stenosis measurements (when applicable) are obtained utilizing NASCET criteria, using the distal internal carotid diameter as the denominator. RADIATION DOSE REDUCTION: This exam was performed according to the departmental dose-optimization program which includes automated exposure  control, adjustment of the mA and/or kV according to patient size and/or use of iterative reconstruction technique. CONTRAST:  OMNIPAQUE IOHEXOL 350 MG/ML SOLN COMPARISON:  CT head without contrast 02/05/2022. MR head without contrast 02/06/2022. MRA of the head 07/15/2021. FINDINGS: CTA NECK FINDINGS Aortic arch: Atherosclerotic calcifications are present at the aortic arch and great vessel origins. Common origin of the innominate artery and left common carotid artery is present. High-grade stenosis is present at the origin of the left subclavian artery. The lumen is narrowed to approximately 1 mm. This compares with a more distal vessel of up to 9 mm. No significant stenosis is present at the common origin of the innominate and left common carotid artery. Right carotid system: The right common carotid artery is mildly tortuous some calcification is present at its origin. No significant stenosis is present in the right common carotid artery. Dense calcifications are present along the medial aspect of the right carotid bifurcation and proximal ICA. No significant stenosis of greater than 50% is present. The more distal right common carotid artery demonstrates some distal mural calcification without significant stenosis. Left carotid system: The left common carotid artery demonstrates some proximal mural calcification without significant stenosis. Calcifications are present bifurcation proximal left ICA without a significant stenosis relative to the more distal vessel. Cervical left ICA is otherwise normal. Vertebral arteries: The right vertebral artery is the dominant vessel. It originates from the subclavian artery. The left vertebral artery is hypoplastic also originated from the left subclavian artery. A 50% stenosis is present in the left vertebral artery at the level C4-5. Left vertebral artery is occluded at the V3 segment at the level of C2. No significant stenosis present on the right. Skeleton:  Multilevel degenerative changes are present cervical spine with leftward curvature compensating for rightward curvature in the thoracic spine. No focal osseous lesions are present. Other neck: Soft tissues the neck are otherwise unremarkable. Salivary glands are within normal limits. Thyroid is normal. No significant adenopathy is present. No focal mucosal or submucosal lesions are present. Upper chest: Lung apices demonstrate scattered ground-glass attenuation with some dependent atelectasis. No focal nodule or mass lesion is present. Thoracic inlet is within normal limits. Review of the MIP images confirms the above findings CTA HEAD FINDINGS Anterior circulation: Areas of 50% stenosis are present in the anterior genu of the right cavernous internal carotid artery. No greater stenoses  are present on either side. ICA termini are within normal limits bilaterally. Left A1 segment is dominant. The right A1 segment demonstrates a high-grade stenosis or occlusion. There is some contrast in the more distal right A1 segment as seen on the prior MRI. The proximal right ACA vessel is occluded as before. There is some reconstitution of the more distal right ACA. Left ACA demonstrates no significant proximal stenosis or occlusion. The left M1 segment is within normal limits. The previously described irregularity of the ICA terminus appears to represent a 3 mm ICA terminus aneurysm directed superiorly and laterally. MCA bifurcations are within normal limits bilaterally. Diffuse irregularity is present within MCA branch vessels bilaterally without a significant proximal stenosis or occlusion. Asymmetric attenuation of left MCA branch vessels is similar to the prior exam. Posterior circulation: Left vertebral artery occlusion is chronic. High-grade stenosis of the distal right V4 segment again noted. The basilar artery is small centrally terminating at the superior cerebellar arteries. High-grade proximal basilar artery again  noted as on the prior exam. Both posterior cerebral arteries originate from posterior communicating arteries. Similar branch vessel irregularity is present without a proximal occlusion or high-grade stenosis. Venous sinuses: The dural sinuses are patent. The straight sinus and deep cerebral veins are intact. Cortical veins are within normal limits. No significant vascular malformation is evident. Anatomic variants: Fetal type posterior cerebral arteries. Review of the MIP images confirms the above findings IMPRESSION: 1. 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 2. 50% stenosis of the left vertebral artery at the level of C4-5. 3. 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 With minimal reconstitution at the dural margin in then in occlusion again in the V4 segment. 4. High-grade stenosis or occlusion of the right A1 segment with some contrast in the more distal right A1 segment as seen on the prior MRI. High-grade stenosis or occlusion also in the more distal right ACA, similar the prior exam. 5. Stable high-grade stenosis of the distal right V4 segment. 6. 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 7. 3 mm left ICA terminus aneurysm. This was described previously as irregularity. It appears to be a focal aneurysm, stable 8. 50% stenosis of the anterior genu of the right cavernous internal carotid artery. 9.  Aortic Atherosclerosis (ICD10-I70.0). Electronically Signed   By: Marin Roberts M.D.   On: 02/06/2022 12:11   MR BRAIN WO CONTRAST  Result Date: 02/06/2022 CLINICAL DATA:  Transient ischemic attack EXAM: MRI HEAD WITHOUT CONTRAST TECHNIQUE: Multiplanar, multiecho pulse sequences of the brain and surrounding structures were obtained without intravenous contrast. COMPARISON:  None Available. FINDINGS: Brain: No acute infarct, mass effect or extra-axial collection. Right cerebellar siderosis. There is multifocal  hyperintense T2-weighted signal within the white matter. Generalized volume loss. 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 The midline structures are normal. Vascular: Major flow voids are preserved. Skull and upper cervical spine: Normal calvarium and skull base. Visualized upper cervical spine and soft tissues are normal. Sinuses/Orbits:No paranasal sinus fluid levels or advanced mucosal thickening. No mastoid or middle ear effusion. Normal orbits. IMPRESSION: 1. No acute intracranial abnormality. 2. 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 Electronically Signed   By: Deatra Robinson M.D.   On: 02/06/2022 03:02            LOS: 0 days    Time spent: 50 minutes     Sunnie Nielsen, DO  [facility] 02/06/2022, 4:06 PM    Dictation software may have been used to generate the above note. Typos may occur and escape review in typed/dictated notes. Please contact Dr Lyn Hollingshead directly for clarity if needed.  Staff may message me via secure chat in Epic  but this may not receive an immediate response,  please page me for urgent matters!  If 7PM-7AM, please contact night coverage [email or web address]

## 2022-02-06 NOTE — Hospital Course (Addendum)
Paula Hansen is a 87 y.o. female with medical history significant for type 2 diabetes, CKD, hypertension, hypothyroidism, who presents after an episode of word finding difficulty, this episode lasted about 40 minutes and that she was back to her baseline by time of arrival to ED. Similar episode earlier this year, saw PCP 06/10/21 for several episodes of speech difficulty over course of previous week. Kept on ASA, statin. MRI 06/12/21 acute/subacute infarct L temporal lobe and L lateral occipital lobe. MRA head/neck 07/15/21 no flow L vertebral artery likely chronic, irregular basilar artery, irregular R A1 ACA w/ minimal flow R A2 ACA. Neurology office visit 06/25/21 continue rosuvastatin 20, monitor home BP, continue Plavix 75 and ASA 81. Cardiology office visit 07/13/21 echo and 72h Holter.  Follows w/ vascular surgery for carotid stenosis and subclavian artery stenosis, recent visit w/ Dr Gilda Crease 01/25/22, see note for full details but recommended medical management and follow in 12 months w/ duplex US 12/15: In the ED her vital signs were notable only for hypertension.  CMP and CBC were at her baseline.  UA was unremarkable.  UDS was normal.  CT head without contrast did not show any acute findings.  Neurology was consulted and concern for TIA versus stroke.  They recommended usual workup (MRI brain wo contrast, CTA H/N, Echo, telemetry) and she was admitted for probable TIA vs stroke favoring distal L MCA. Rx Plavix 300 mg loading dose, follow w/ Plavix 75 mg 20 days and ASA 81 mg indefinitely.  12/16: see below for CTA H/N results and echo results - known concerns w/ stenotic vasculature. Stable but unable to go home at this time d/t no ride and family not able to help until tomorrow.   Consultants:  Neurology   Procedures: none      ASSESSMENT & PLAN:   Principal Problem:   TIA (transient ischemic attack) Active Problems:   Known medical problems   TIA (transient ischemic attack) MRI  brain --> "No acute intracranial abnormality. 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, right thalamic and left occipital infarcts" vs MRI brain 05/2021  "posterior left temporal lobe and lateral left occipital lobe, consistent with acute to subacute infarcts." CTA H/N --> see below, high grade L subclavian stenosis, 50% stenosis L vertebral artery level C4-5, chronic occlusion L vertebral artery level C2, stable high grade stenosis/occlusion R A1, stable high grade stenosis distal R V4 and proximal basilar artery, small ICA terminus aneurysm 30mm TTE --> LVEF 60-65, no RWMA, moderate LVH, G1DD Telemetry Lipids --> LDL 53 Bedside swallow study --> passed Permissive HTN x24h - per neurology "Permissive hypertension for 24 hrs (Give when necessary antihypertensive only if systolic blood pressure >220 mmHg or diastolic blood pressure >120 mmHg or signs of end organ damage) following guidelines SBP less than 180 following 24 hours, then less than 160 following 24 hours followed by goal of SBP less than --> held home antihypertensives  ?cardiac event monitor per neurology  Continue Plavix total 21 days vs indefinite - await neurology recs  Continue aspirin 81 mg indefinitely Currently on rosuvastatin 10, has numerous allergies to other statins listed as myalgias, will increase to 40 and monitor  Known medical problems Hypertension - holding home amlodipine, benazepril this morning to allow permissive HTN as above  DM2 - continue metformin, pioglitazone.  Hold glipizide GERD - continue PPI Neuropathy - continue gabapentin Hypothyroidism - continue Synthroid    DVT prophylaxis: enoxaparin Pertinent IV fluids/nutrition: no continuous IV fluids Central lines / invasive devices: none  Code Status: FULL CODE - has advanced directive on file w/ HCPOA designated, dated 2018 Family Communication: spoke w/ son, Paula Hua, 02/06/22 12:53 PM all questions answered  Disposition: observation TOC needs: home health Barriers to  discharge / significant pending items: pending results and neuro eval and later TOC confirms unable to go home at this time d/t no ride and family not able to help until tomorrow unsafe discharge for now but anticipate safe discharge tomorrow morning.

## 2022-02-06 NOTE — Assessment & Plan Note (Addendum)
Hypertension-continue amlodipine, benazepril DM2-continue metformin, pioglitazone.  Hold glipizide GERD-continue PPI Neuropathy-continue gabapentin Hypothyroidism-continue Synthroid

## 2022-02-06 NOTE — Progress Notes (Signed)
SLP Cancellation Note  Patient Details Name: Paula Hansen MRN: [id number] DOB: 30-May-1934   Cancelled treatment:       Reason Eval/Treat Not Completed: Fatigue/lethargy limiting ability to participate. Chart reviewed; note imaging negative for acute findings. Attempted visit for screen vs need for cognitive-language evaluation, however pt is very drowsy and having difficulty keeping her eyes open. Will follow up next date.  Rondel Baton, [place], [employer] Pathologist [phone]    Arlana Lindau 02/06/2022, 2:13 PM

## 2022-02-06 NOTE — Consult Note (Signed)
Neurology Consultation Reason for Consult: TIA  Requesting Physician: Sunnie Nielsen   CC: Speech difficulty   History is obtained from: Chart review primarily given patient fatigue; tried to reach son but my call was not answered   HPI: Paula Hansen is a 87 y.o. 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  This was rapidly resolving at the time of ED arrival and therefore she was not a candidate for TNK.  Blood pressure was quite elevated on arrival (210/74) but has normalized.  MRI was negative for stroke and neurology was consulted for recommendations for TIA management  At the time of my evaluation the patient is very sleepy.  Noting that she had just worked with physical therapy and was oriented with good effort for them, and that chart review stated patient care assistant notes the patient can be very hard to wake, I returned an hour later.  At this time the patient remained very sleepy but was able to be awakened enough to participate in a standard stroke scale examination  Of note she also recently had a Holter monitor earlier this year for 72 hours which was negative for atrial fibrillation  LKW: 12/15 19:15  Thrombolytic given?: No, felt to be back to baseline on telespecialist eval overnight IA performed?: No, exam not c/w LVO  Premorbid modified rankin scale:      3 - Moderate disability. Requires some help, but able to walk unassisted.     4 - Moderately severe disability. Unable to attend to own bodily  needs without assistance, and unable to walk unassisted.     5 - Severe disability. Requires constant nursing care and attention, bedridden, incontinent.     6 - Dead.  ROS: Unable to obtain due to somnolence   Past Medical History:  Diagnosis Date   Arthritis    Diabetes mellitus    Diabetic neuropathy (HCC)    GERD (gastroesophageal reflux disease)    Hypercholesteremia    Hypertension    Hypothyroidism    Lumbar stenosis    L3-L4 left   PONV (postoperative nausea and vomiting)    Wears glasses    Wears partial dentures    lower   Past Surgical History:  Procedure Laterality Date   ABDOMINAL HYSTERECTOMY  1978   COLONOSCOPY     COLONOSCOPY W/ POLYPECTOMY     DILATION AND CURETTAGE OF UTERUS     JOINT REPLACEMENT     left knee October 2012.   KNEE ARTHROSCOPY W/ MENISCAL REPAIR     left knee   LUMBAR LAMINECTOMY/DECOMPRESSION MICRODISCECTOMY  08/17/2011   Procedure: LUMBAR LAMINECTOMY/DECOMPRESSION MICRODISCECTOMY 1 LEVEL;  Surgeon: Clydene Fake, MD;  Location: [facility];  Service: Neurosurgery;  Laterality: Right;  Right Lumbar Two-Three Laminectomy/Diskectomy   LUMBAR LAMINECTOMY/DECOMPRESSION MICRODISCECTOMY Left 10/22/2015   Procedure: Laminectomy and Foraminotomy - Lumbar three-lumbar four, left;  Surgeon: Tia Alert, MD;  Location: [facility];  Service: Neurosurgery;  Laterality: Left;   TOOTH EXTRACTION     Current Outpatient Medications  Medication Instructions   amLODipine-benazepril (LOTREL) 10-20 MG per capsule 1 capsule, Daily   aspirin EC  81 mg, Oral, Every other day   Biotin 2,500 mcg, Oral, Daily   Calcium Carbonate-Vit D-Min (CALTRATE PLUS PO) 1 tablet, Daily   cetirizine (ZYRTEC) 10 mg, Daily   Cholecalciferol (VITAMIN D-1000 MAX ST) 1,000 Units, Oral, Daily   clopidogrel (PLAVIX) 75 MG tablet Oral   Coenzyme Q10 10 mg, Oral, Daily   esomeprazole (NEXIUM) 40 mg, Daily before breakfast   Flaxseed Oil 1,000 mg, Oral, Daily   gabapentin  (NEURONTIN) 300 mg, 3 times daily   glipiZIDE (GLUCOTROL XL) 10 mg, Oral, 2 times daily   levothyroxine (SYNTHROID) 50 mcg, Oral, Daily before breakfast   metFORMIN (GLUCOPHAGE) 1,000 mg, Oral, 2 times daily with meals   Multiple Vitamin (MULTIVITAMIN WITH MINERALS) TABS 1 tablet, Daily   naproxen sodium (ALEVE) 220 mg, Oral, 2 times daily PRN   pioglitazone (ACTOS) 30 MG tablet 1 tablet, Oral, Daily   Probiotic Product (ALIGN PO) Oral   rosuvastatin (CRESTOR) 10 mg, Every other day   vitamin E 400 Units, Oral, Daily     Family History  Problem Relation Age of Onset   Breast cancer Maternal Aunt        1350's   Stroke Mother    Stroke Father    Cancer - Other Brother    Breast cancer Cousin     Social History:  reports that she has quit smoking. Her smoking use included cigarettes. She has never used smokeless tobacco. She reports that she does not drink alcohol and does not use drugs.   Exam: Current vital signs: BP 125/89 (BP Location: Left Arm)   Pulse 93   Temp 98.2 F (36.8 C)   Resp 19   Ht 5\' 3"  (1.6 m)   Wt 70.1 kg   SpO2 98%   BMI 27.37 kg/m  Vital signs in last 24 hours: Temp:  [97.4 F (36.3 C)-98.4 F (36.9 C)] 98.2 F (36.8 C) (12/16 0928) Pulse Rate:  [80-94] 93 (12/16 0928) Resp:  [14-36] 19 (12/16 0928) BP: (123-210)/(70-109) 125/89 (12/16 0928) SpO2:  [91 %-99 %] 98 % (12/16 0928) Weight:  [70.1 kg] 70.1 kg (12/15 2245)   Physical Exam  Constitutional: Appears elderly Psych: Minimally interactive Eyes: No scleral injection HENT: No oropharyngeal obstruction.  MSK: no major joint deformities.  Cardiovascular: Perfusing extremities well Respiratory: Effort normal, non-labored breathing GI: Soft.  No distension. There is no tenderness.  Skin: Multiple bruises bilateral shins   Neuro: Mental Status: Patient is sleepy but awakens with repeated stimulation, oriented to person, place, month, initially states age is [age] but then corrects to  [age] Within limits of sleepiness, no aphasia or neglect Cranial Nerves: II: Visual Fields are full. Pupils are equal, round, and reactive to light.   III,IV, VI: EOMI without ptosis or diploplia.  V: Facial sensation is symmetric to light touch  VII: Facial movement is notable for slight LEFT facial droop VIII: hearing is intact to voice X: Uvula elevates symmetrically XI: Shoulder shrug is symmetric. XII: tongue is midline without atrophy or fasciculations.  Motor: No drift in any of the extremities but too sleepy for confrontational strength testing Sensory: Sensation is symmetric to light touch in all 4 extremities  Deep Tendon Reflexes: 2+ and symmetric in the brachioradialis and patellae. Cerebellar: FNF and HKS are intact bilaterally Gait:  Deferred due to fatigue  NIHSS total 3 Score breakdown: 1-point for answering age wrong, 1-point for mild left facial droop, 1-point for dysarthria Performed at 4 PM    I  have reviewed labs in epic and the results pertinent to this consultation are: Lab Results  Component Value Date   CHOL 121 02/06/2022   HDL 55 02/06/2022   LDLCALC 53 02/06/2022   TRIG 66 02/06/2022   CHOLHDL 2.2 02/06/2022   No results found for: "HGBA1C"   I have reviewed the images obtained:  MRI brain personally reviewed, agree with radiology:   1. No acute intracranial abnormality. 2. 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  CTA personally reviewed, agree with radiology:   1. 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 2. 50% stenosis of the left vertebral artery at the level of C4-5. 3. 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 With minimal reconstitution at the dural margin in then in occlusion again in the V4 segment. 4. High-grade stenosis or occlusion of the right A1 segment with some contrast in the more distal right A1 segment as seen on the prior  MRI. High-grade stenosis or occlusion also in the more distal right ACA, similar the prior exam. 5. Stable high-grade stenosis of the distal right V4 segment. 6. 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 7. 3 mm left ICA terminus aneurysm. This was described previously as irregularity. It appears to be a focal aneurysm, stable 8. 50% stenosis of the anterior genu of the right cavernous internal carotid artery. 9.  Aortic Atherosclerosis (ICD10-I70.0).  ECHO:  1. Left ventricular ejection fraction, by estimation, is 60 to 65%. The  left ventricle has normal function. The left ventricle has no regional  wall motion abnormalities. There is moderate left ventricular hypertrophy.  Left ventricular diastolic  parameters are consistent with Grade I diastolic dysfunction (impaired  relaxation).   2. Right ventricular systolic function is normal. The right ventricular  size is normal.   3. The mitral valve is normal in structure. Mild mitral valve  regurgitation. No evidence of mitral stenosis.   4. The aortic valve is normal in structure. Aortic valve regurgitation is  not visualized. Aortic valve sclerosis/calcification is present, without  any evidence of aortic stenosis.   5. The inferior vena cava is normal in size with greater than 50%  respiratory variability, suggesting right atrial pressure of 3 mmHg.   Impression: This is an 87 year old woman with advanced intracranial atherosclerotic disease at high risk of atheroembolic stroke.  Given her elevated blood pressures at the time of her arrival, I do favor this event to be a TIA.  It appears she is already medically maximized from an atherosclerotic perspective on dual antiplatelet therapy with aspirin and Plavix; if she is on monotherapy instead (medication reconciliation to be confirmed by primary team), I would add a second antiplatelet agent (either aspirin or Plavix) for at least 90-day course, as bilateral limb weakness  was described which could localize to her severe basilar stenosis.  I would not escalate Plavix to Brilinta given her history of cerebellar intracerebral hemorrhage and increased risk of ICH with Brilinta.  Recommendations: -Continue home dual antiplatelet therapy (aspirin and Plavix) -If medication reconciliation reveals patient is actually on monotherapy, add either aspirin or Plavix as appropriate for at least a 90-day course, possibly longer at the discretion of her outpatient providers -Meeting LDL goal less than 70, continue current management -A1c still pending, goal less than 7% -Goal blood pressure normotension -Echocardiogram reassuring, do not feel repeating Holter monitoring would be helpful at this time unless the patient  endorses significant cardiac palpitations when more able to participate in history -Neurology will be available as needed going forward, please reach out if any additional questions or concerns arise    Brooke Dare MD-PhD [facility] [phone] [facility] coverage for [facility] is from 8 AM to 4 AM in-house and 4 PM to 8 PM by telephone/video. 8 PM to 8 AM emergent questions or overnight urgent questions should be addressed to Teleneurology On-call or [facility] neurohospitalist; contact information can be found on AMION

## 2022-02-06 NOTE — ED Notes (Signed)
Patient placed on pure wick °

## 2022-02-06 NOTE — Evaluation (Signed)
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   Clinical Impression  Pt admitted with above diagnosis. Pt received upright in bed agreeable to PT services. Pt reports at baseline she is indep with mobility. Has PCA that comes daily to assist her and husband with meals and transportation.   To date, pt able to exit bed mod-I, stand at [facility] supervision, and ambulate ~240' with supervision to CGA due to intermittent lateral sway during gait. Gait grossly step through pattern without significant LOB needing ankle/hip righting reactions or stepping strategy. Pt returning to seated EoB then supine with all needs in reach. Pt currently with functional limitations due to the deficits listed below (see PT Problem List). Pt will benefit from skilled PT to increase their independence and safety with mobility to allow discharge to the venue listed below.      Recommendations for follow up therapy are one component of a multi-disciplinary discharge planning process, led by the attending physician.  Recommendations may be updated based on patient status, additional functional criteria and insurance authorization.  Follow Up Recommendations Home health PT      Assistance Recommended at Discharge Frequent or constant Supervision/Assistance  Patient can return home with the following  A little help with walking and/or transfers;Assist for transportation;Assistance with cooking/housework;A little help with bathing/dressing/bathroom;Help with stairs or ramp for entrance    Equipment Recommendations None recommended by PT  Recommendations for Other Services       Functional Status  Assessment Patient has had a recent decline in their functional status and demonstrates the ability to make significant improvements in function in a reasonable and predictable amount of time.     Precautions / Restrictions Precautions Precautions: Fall Restrictions Weight Bearing Restrictions: No      Mobility  Bed Mobility Overal bed mobility: Needs Assistance Bed Mobility: Supine to Sit     Supine to sit: Supervision Sit to supine: Supervision     Patient Response: Cooperative  Transfers Overall transfer level: Needs assistance   Transfers: Sit to/from Stand Sit to Stand: Supervision                Ambulation/Gait Ambulation/Gait assistance: Supervision, Min guard Gait Distance (Feet): 240 Feet Assistive device: None Gait Pattern/deviations: Step-through pattern, Decreased step length - right, Decreased step length - left          Stairs            Wheelchair Mobility    Modified Rankin (Stroke Patients Only)       Balance Overall balance assessment: Needs assistance Sitting-balance support: Feet supported Sitting balance-Leahy Scale: Good     Standing balance support: No upper extremity supported Standing balance-Leahy Scale: Fair                               Pertinent Vitals/Pain      Home Living Family/patient expects to be discharged to:: Private residence Living Arrangements: Spouse/significant other Available Help at Discharge: Family Type of Home: House Home Access: Stairs to enter Entrance Stairs-Rails: Right Entrance Stairs-Number of Steps: 3   Home Layout: Able to live on main level with bedroom/bathroom;Two level Home Equipment: Shower  seat;Rolling Walker (2 wheels);Cane - single point Additional Comments: sits in in the tub    Prior Function Prior Level of Function : Needs assist             Mobility Comments: amb with no AD; son reports frequent falls ADLs Comments: [facility] comes  from 10-8 every day to assist with IADLs per son; pt reports she generally gets dressed on her own     Hand Dominance        Extremity/Trunk Assessment   Upper Extremity Assessment Upper Extremity Assessment: Overall WFL for tasks assessed    Lower Extremity Assessment Lower Extremity Assessment: Generalized weakness    Cervical / Trunk Assessment Cervical / Trunk Assessment: Normal  Communication   Communication: No difficulties  Cognition Arousal/Alertness: Awake/alert   Overall Cognitive Status: No family/caregiver present to determine baseline cognitive functioning                                          General Comments General comments (skin integrity, edema, etc.): pt on RA, spo2 >90% throughout    Exercises Other Exercises Other Exercises: Role of PT in acute setting, d/c recs   Assessment/Plan    PT Assessment Patient needs continued PT services  PT Problem List Decreased strength;Decreased activity tolerance;Decreased safety awareness;Decreased balance;Decreased mobility       PT Treatment Interventions DME instruction;Balance training;Gait training;Neuromuscular re-education;Stair training;Patient/family education;Functional mobility training;Therapeutic activities;Therapeutic exercise    PT Goals (Current goals can be found in the Care Plan section)  Acute Rehab PT Goals Patient Stated Goal: to return home PT Goal Formulation: With patient Time For Goal Achievement: 02/20/22 Potential to Achieve Goals: Good    Frequency Min 2X/week     Co-evaluation               AM-PAC PT "6 Clicks" Mobility  Outcome Measure Help needed turning from your back to your side while in a flat bed without using bedrails?: A Little Help needed moving from lying on your back to sitting on the side of a flat bed without using bedrails?: A Little Help needed moving to and from a bed to a chair (including a wheelchair)?: A Little Help needed  standing up from a chair using your arms (e.g., wheelchair or bedside chair)?: A Little Help needed to walk in hospital room?: A Little Help needed climbing 3-5 steps with a railing? : A Lot 6 Click Score: 17    End of Session Equipment Utilized During Treatment: Gait belt Activity Tolerance: Patient tolerated treatment well Patient left: in bed Nurse Communication: Mobility status PT Visit Diagnosis: Muscle weakness (generalized) (M62.81);History of falling (Z91.81);Difficulty in walking, not elsewhere classified (R26.2)    Time: 9604-5409 PT Time Calculation (min) (ACUTE ONLY): 18 min   Charges:   PT Evaluation $PT Eval Low Complexity: 1 Low        Seraj Dunnam M. Fairly IV, PT, DPT Physical Therapist- [place]  [facility]  02/06/2022, 2:13 PM

## 2022-02-07 DIAGNOSIS — G459 Transient cerebral ischemic attack, unspecified: Secondary | ICD-10-CM | POA: Diagnosis not present

## 2022-02-07 DIAGNOSIS — E875 Hyperkalemia: Secondary | ICD-10-CM | POA: Diagnosis not present

## 2022-02-07 DIAGNOSIS — A4159 Other Gram-negative sepsis: Secondary | ICD-10-CM | POA: Diagnosis not present

## 2022-02-07 MED ORDER — ROSUVASTATIN CALCIUM 40 MG PO TABS: ORAL_TABLET | ORAL | 0 refills | Status: DC

## 2022-02-07 NOTE — Evaluation (Signed)
Speech Language Pathology Evaluation Patient Details Name: Paula Hansen MRN: [id number] DOB: Aug 08, 1934 Today's Date: 02/07/2022 Time: 4098-1191 SLP Time Calculation (min) (ACUTE ONLY): 13 min  Problem List:  Patient Active Problem List   Diagnosis Date Noted   Known medical problems 02/06/2022   TIA (transient ischemic attack) 02/05/2022   Carotid stenosis 07/29/2021   Subclavian artery stenosis (HCC) 07/29/2021   Essential hypertension 07/29/2021   Hyperlipidemia 07/29/2021   Diabetes (HCC) 07/29/2021   Closed Colles' fracture 07/06/2021   Swelling of joint of left wrist 06/15/2021   Diabetic polyneuropathy associated with type 2 diabetes mellitus (HCC) 05/28/2020   Nonproliferative diabetic retinopathy of both eyes (HCC) 05/11/2019   Chronic kidney disease, stage III (moderate) (HCC) 12/22/2017   S/P lumbar laminectomy 10/22/2015   Status post total left knee replacement 12/01/2014   Type II diabetes mellitus (HCC) 08/19/2014   Gastroesophageal reflux disease without esophagitis 02/06/2014   Mixed hyperlipidemia 02/06/2014   Hypothyroidism 08/13/2013   Past Medical History:  Past Medical History:  Diagnosis Date   Arthritis    Diabetes mellitus    Diabetic neuropathy (HCC)    GERD (gastroesophageal reflux disease)    Hypercholesteremia    Hypertension    Hypothyroidism    Lumbar stenosis    L3-L4 left   PONV (postoperative nausea and vomiting)    Wears glasses    Wears partial dentures    lower   Past Surgical History:  Past Surgical History:  Procedure Laterality Date   ABDOMINAL HYSTERECTOMY  1978   COLONOSCOPY     COLONOSCOPY W/ POLYPECTOMY     DILATION AND CURETTAGE OF UTERUS     JOINT REPLACEMENT     left knee October 2012.   KNEE ARTHROSCOPY W/ MENISCAL REPAIR     left knee   LUMBAR LAMINECTOMY/DECOMPRESSION MICRODISCECTOMY  08/17/2011   Procedure: LUMBAR LAMINECTOMY/DECOMPRESSION MICRODISCECTOMY 1 LEVEL;  Surgeon: Clydene Fake, MD;  Location: [facility];  Service: Neurosurgery;  Laterality: Right;  Right Lumbar Two-Three Laminectomy/Diskectomy   LUMBAR LAMINECTOMY/DECOMPRESSION MICRODISCECTOMY Left 10/22/2015   Procedure: Laminectomy and Foraminotomy - Lumbar three-lumbar four, left;  Surgeon: Tia Alert, MD;  Location: [facility];  Service: Neurosurgery;  Laterality: Left;   TOOTH EXTRACTION     HPI:  Per H&P "Paula Hansen is a 87 y.o. female with medical history significant for type 2 diabetes, CKD, hypertension, hypothyroidism, who presents after an episode of word finding difficulty.     Patient reports that she was at the [employer] in [place] with her PCA and husband earlier today.  When they sat down to eat she suddenly could not speak clearly or make any sense.  This was very alarming to both her husband and her PCA and they called 911.  PCA was worried in particular because patient's husband had just had a stroke recently.  They estimate the episode lasted about 40 minutes and that she is now back to her baseline.     In the ED her vital signs were notable only for hypertension.  CMP and CBC were at her baseline.  UA was unremarkable.  UDS was normal.  CT head without contrast did not show any acute findings.  Neurology was consulted and concern for TIA versus stroke.  They recommended usual workup and she was admitted."   Assessment / Plan / Recommendation Clinical Impression  Pt seen for cognitive-linguistic evaluation. Pt initially lethargic, but able to rouse for participation in evaluation. Noted pt with  hx of cognitive impairments per EMR. No family present to obtain additional information baseline cognitive-linguistic functioning.  Assessment completed via informal means and portions of Cognistat. Pt presents with cognitive-linguistic deficits affecting orientation (temporal), memory (short term; 2/8 Memory subtest), reasoning (4/8 Reasoning subtest), and problem solving (2/3 Judgment subtest). Pt with  reduced ability to follow 2-step and complex directives. Speech is fluent and appropriate. No s/sx anomia or dysarthria noted.   Based on today's assessment, anticipate need for frequent/constant supervision and post-acute SLP services for functional cognitive-linguistic ability in home environment. Anticipate need for assistance with iADLs (including, but not limited to: driving, medication/financial management, meal management).  SLP to sign off at this level of care.   Pt, RN, and TOC made aware of results, recommendations, and SLP POC. Reinforcement of content may be needed due to pt's cognitive-linguistic deficits.       SLP Assessment  SLP Recommendation/Assessment: Patient needs continued Speech Lanaguage Pathology Services SLP Visit Diagnosis: Cognitive communication deficit (R41.841)    Recommendations for follow up therapy are one component of a multi-disciplinary discharge planning process, led by the attending physician.  Recommendations may be updated based on patient status, additional functional criteria and insurance authorization.    Follow Up Recommendations  Home health SLP    Assistance Recommended at Discharge  Frequent or constant Supervision/Assistance  Functional Status Assessment Patient has had a recent decline in their functional status and demonstrates the ability to make significant improvements in function in a reasonable and predictable amount of time.        SLP Evaluation Cognition  Overall Cognitive Status: No family/caregiver present to determine baseline cognitive functioning Arousal/Alertness: Awake/alert Orientation Level: Oriented to time Year: 2022 Day of Week: Incorrect Memory: Impaired Memory Impairment: Storage deficit;Retrieval deficit;Decreased recall of new information Problem Solving: Impaired Problem Solving Impairment: Verbal basic Executive Function: Reasoning;Sequencing Reasoning: Impaired Sequencing: Impaired Safety/Judgment:  Impaired       Comprehension  Auditory Comprehension Overall Auditory Comprehension: Impaired Yes/No Questions: Within Functional Limits Commands: Impaired Conversation: Simple (WFL)    Expression Expression Primary Mode of Expression: Verbal Verbal Expression Overall Verbal Expression: Appears within functional limits for tasks assessed Initiation: No impairment Automatic Speech:  (WFL) Repetition: No impairment Naming: No impairment Pragmatics: No impairment   Oral / Motor  Motor Speech Overall Motor Speech: Appears within functional limits for tasks assessed Respiration: Within functional limits Phonation: Normal Resonance: Within functional limits Articulation: Within functional limitis Intelligibility: Intelligible Motor Planning: Witnin functional limits           Clyde Canterbury, M.S., CCC-SLP Speech-Language Pathologist [facility] [phone] Arnette Felts)   Woodroe Chen 02/07/2022, 9:56 AM

## 2022-02-07 NOTE — Discharge Summary (Signed)
Physician Discharge Summary   Patient: Paula Hansen MRN: [id number]  DOB: 10/31/1934   Admit:     Date of Admission: 02/05/2022 Admitted from: home   Discharge: Date of discharge: 02/07/22 Disposition: Home Condition at discharge: good  CODE STATUS: FULL CODE      Discharge Physician: Paula Nielsen, DO [facility]     PCP: Paula Nurse, MD  Recommendations for Outpatient Follow-up:  Follow up with PCP Paula Nurse, MD in 1-2 weeks Consider obtain labs/tests: CBC, BMP  Ensure follow up with neurology and vascular surgery  Please follow up on the following pending results: none   Discharge Instructions     Diet - low sodium heart healthy   Complete by: As directed    Face-to-face encounter (required for Medicare/Medicaid patients)   Complete by: As directed    I Paula Hansen certify that this patient is under my care and that I, or a Hansen practitioner or physician's assistant working with me, had a face-to-face encounter that meets the physician face-to-face encounter requirements with this patient on 02/07/2022. The encounter with the patient was in whole, or in part for the following medical condition(s) which is the primary reason for home health care (List medical condition): debility, TIA   The encounter with the patient was in whole, or in part, for the following medical condition, which is the primary reason for home health care: debility, TIA   I certify that, based on my findings, the following services are medically necessary home health services:  Nursing Physical therapy     Reason for Medically Necessary Home Health Services:  Skilled Nursing- Change/Decline in Patient Status Therapy- Investment banker, operational, Patent examiner Therapy- Instruction on Safe use of Assistive Devices for ADLs Therapy- Therapeutic Exercises to Increase Strength and Endurance     My clinical findings support the need for the above services:  Unable to leave home safely without assistance and/or assistive device   Further, I certify that my clinical findings support that this patient is homebound due to: Unable to leave home safely without assistance   Home Health   Complete by: As directed    To provide the following care/treatments:  PT OT RN     Increase activity slowly   Complete by: As directed          Discharge Diagnoses: Principal Problem:   TIA (transient ischemic attack) Active Problems:   Known medical problems       Hospital Course: Paula Hansen is a 87 y.o. female with medical history significant for type 2 diabetes, CKD, hypertension, hypothyroidism, who presents after an episode of word finding difficulty, this episode lasted about 40 minutes and that she was back to her baseline by time of arrival to ED. Similar episode earlier this year, saw PCP 06/10/21 for several episodes of speech difficulty over course of previous week. Kept on ASA, statin. MRI 06/12/21 acute/subacute infarct L temporal lobe and L lateral occipital lobe. MRA head/neck 07/15/21 no flow L vertebral artery likely chronic, irregular basilar artery, irregular R A1 ACA w/ minimal flow R A2 ACA. Neurology office visit 06/25/21 continue rosuvastatin 20, monitor home BP, continue Plavix 75 and ASA 81. Cardiology office visit 07/13/21 echo and 72h Holter.  Follows w/ vascular surgery for carotid stenosis and subclavian artery stenosis, recent visit w/ Dr Paula Hansen 01/25/22, see note for full details but recommended medical management and follow in 12 months w/ duplex US 12/15: In the  ED her vital signs were notable only for hypertension.  CMP and CBC were at her baseline.  UA was unremarkable.  UDS was normal.  CT head without contrast did not show any acute findings.  Neurology was consulted and concern for TIA versus stroke.  They recommended usual workup (MRI brain wo contrast, CTA H/N, Echo, telemetry) and she was admitted for probable TIA vs  stroke favoring distal L MCA. Rx Plavix 300 mg loading dose, follow w/ Plavix 75 mg 20 days and ASA 81 mg indefinitely.  12/16: see below for CTA H/N results and echo results - known concerns w/ stenotic vasculature. Stable but unable to go home at this time d/t no ride and family not able to help until tomorrow.  12/17: no change from yesterday, ok for discharge home today   Consultants:  Neurology   Procedures: none      ASSESSMENT & PLAN:   Principal Problem:   TIA (transient ischemic attack) Active Problems:   Known medical problems   TIA (transient ischemic attack) MRI brain --> "No acute intracranial abnormality. 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, right thalamic and left occipital infarcts" vs MRI brain 05/2021  "posterior left temporal lobe and lateral left occipital lobe, consistent with acute to subacute infarcts." CTA H/N --> see below, high grade L subclavian stenosis, 50% stenosis L vertebral artery level C4-5, chronic occlusion L vertebral artery level C2, stable high grade stenosis/occlusion R A1, stable high grade stenosis distal R V4 and proximal basilar artery, small ICA terminus aneurysm 62mm TTE --> LVEF 60-65, no RWMA, moderate LVH, G1DD Telemetry Lipids --> LDL 53 Bedside swallow study --> passed Permissive HTN x24h - per neurology "Permissive hypertension for 24 hrs (Give when necessary antihypertensive only if systolic blood pressure >220 mmHg or diastolic blood pressure >120 mmHg or signs of end organ damage) following guidelines SBP less than 180 following 24 hours, then less than 160 following 24 hours followed by goal of SBP less than --> held home antihypertensives  ?cardiac event monitor per neurology  Continue Plavix total 21 days vs indefinite - await neurology recs  Continue aspirin 81 mg indefinitely Currently on rosuvastatin 10, has numerous allergies to other statins listed as myalgias, will increase to 40 and monitor  Known medical problems Hypertension -  holding home amlodipine, benazepril this morning to allow permissive HTN as above  DM2 - continue metformin, pioglitazone.  Hold glipizide GERD - continue PPI Neuropathy - continue gabapentin Hypothyroidism - continue Synthroid            Discharge Instructions  Allergies as of 02/07/2022       Reactions   Nickel Other (See Comments)   BREAKING OUT IN MOUTH IRRITATION RINGING IN THE EARS   Atorvastatin Other (See Comments)   Muscle Pain   Ezetimibe-simvastatin Other (See Comments)   MYALGIAS   Pravastatin Other (See Comments)   MYALGIAS   Simvastatin Other (See Comments)   MYALGIAS   Amoxicillin Other (See Comments)   UNSPECIFIED REACTION   Codeine Other (See Comments)   Chest pain   Morphine And Related Nausea And Vomiting   Oxycodone Nausea And Vomiting   Shellfish Allergy Nausea And Vomiting   Sulfa Antibiotics Nausea Only, Other (See Comments)   Upset stomach with burning        Medication List     TAKE these medications    ALIGN PO Take by mouth.   amLODipine-benazepril 10-20 MG capsule Commonly known as: LOTREL Take 1 capsule by mouth daily.  aspirin EC 81 MG tablet Take 81 mg by mouth every other day.   Biotin 2.5 MG Tabs Take 2,500 mcg by mouth daily.   CALTRATE PLUS PO Take 1 tablet by mouth daily.   cetirizine 10 MG tablet Commonly known as: ZYRTEC Take 10 mg by mouth daily.   clopidogrel 75 MG tablet Commonly known as: PLAVIX Take by mouth.   Coenzyme Q10 10 MG capsule Take 10 mg by mouth daily.   esomeprazole 40 MG capsule Commonly known as: NEXIUM Take 40 mg by mouth daily before breakfast.   Flaxseed Oil 1000 MG Caps Take 1,000 mg by mouth daily.   gabapentin 300 MG capsule Commonly known as: NEURONTIN Take 300 mg by mouth 3 (three) times daily.   glipiZIDE 10 MG 24 hr tablet Commonly known as: GLUCOTROL XL Take 10 mg by mouth 2 (two) times daily.   levothyroxine 50 MCG tablet Commonly known as:  SYNTHROID Take 50 mcg by mouth daily before breakfast.   metFORMIN 500 MG tablet Commonly known as: GLUCOPHAGE Take 1,000 mg by mouth 2 (two) times daily with a meal.   multivitamin with minerals Tabs tablet Take 1 tablet by mouth daily.   naproxen sodium 220 MG tablet Commonly known as: ALEVE Take 220 mg by mouth 2 (two) times daily as needed (for pain).   pioglitazone 30 MG tablet Commonly known as: ACTOS Take 1 tablet by mouth daily.   rosuvastatin 40 MG tablet Commonly known as: CRESTOR Take 0.5 tablets (20 mg total) by mouth every other day for 6 days, THEN 1 tablet (40 mg total) every other day. Start taking on: February 08, 2022 What changed:  medication strength See the new instructions.   Vitamin D-1000 Max St 25 MCG (1000 UT) tablet Generic drug: Cholecalciferol Take 1,000 Units by mouth daily.   vitamin E 180 MG (400 UNITS) capsule Take 400 Units by mouth daily.          Allergies  Allergen Reactions   Nickel Other (See Comments)    BREAKING OUT IN MOUTH IRRITATION RINGING IN THE EARS   Atorvastatin Other (See Comments)    Muscle Pain   Ezetimibe-Simvastatin Other (See Comments)    MYALGIAS   Pravastatin Other (See Comments)    MYALGIAS   Simvastatin Other (See Comments)    MYALGIAS   Amoxicillin Other (See Comments)    UNSPECIFIED REACTION    Codeine Other (See Comments)    Chest pain   Morphine And Related Nausea And Vomiting   Oxycodone Nausea And Vomiting   Shellfish Allergy Nausea And Vomiting   Sulfa Antibiotics Nausea Only and Other (See Comments)    Upset stomach with burning      Subjective: pt has no complaints this morning other than cannot locate her glasses or her phone. No weakness, no speech problemd    Discharge Exam: BP 132/89 (BP Location: Left Arm)   Pulse 95   Temp 98.7 F (37.1 C)   Resp 20   Ht  (1.6 m)   Wt 70.1 kg   SpO2 (!) 88%   BMI 27.37 kg/m  General: Pt is alert, awake, not in acute  distress Cardiovascular: RRR, S1/S2 +, no rubs, no gallops Respiratory: CTA bilaterally, no wheezing, no rhonchi Abdominal: Soft, NT, ND, bowel sounds + Extremities: no edema, no cyanosis     The results of significant diagnostics from this hospitalization (including imaging, microbiology, ancillary and laboratory) are listed below for reference.     Microbiology:  No results found for this or any previous visit (from the past 240 hour(s)).   Labs: BNP (last 3 results) Recent Labs    02/06/22 0437  BNP 88.4   Basic Metabolic Panel: Recent Labs  Lab 02/05/22 2119  NA 139  K 4.2  CL 107  CO2 23  GLUCOSE 97  BUN 38*  CREATININE 1.17*  CALCIUM 9.4   Liver Function Tests: Recent Labs  Lab 02/05/22 2119  AST 21  ALT 14  ALKPHOS 63  BILITOT 0.5  PROT 7.5  ALBUMIN 4.0   No results for input(s): "LIPASE", "AMYLASE" in the last 168 hours. No results for input(s): "AMMONIA" in the last 168 hours. CBC: Recent Labs  Lab 02/05/22 2119  WBC 7.4  NEUTROABS 5.4  HGB 11.7*  HCT 36.9  MCV 100.0  PLT 292   Cardiac Enzymes: Recent Labs  Lab 02/06/22 0437  CKTOTAL 81   BNP: Invalid input(s): "POCBNP" CBG: Recent Labs  Lab 02/05/22 2100 02/06/22 1931  GLUCAP 84 120*   D-Dimer No results for input(s): "DDIMER" in the last 72 hours. Hgb A1c No results for input(s): "HGBA1C" in the last 72 hours. Lipid Profile Recent Labs    02/06/22 0437  CHOL 121  HDL 55  LDLCALC 53  TRIG 66  CHOLHDL 2.2   Thyroid function studies Recent Labs    02/06/22 0437  TSH 1.984   Anemia work up Recent Labs    02/06/22 0437  VITAMINB12 240   Urinalysis    Component Value Date/Time   COLORURINE STRAW (A) 02/05/2022 2250   APPEARANCEUR CLEAR (A) 02/05/2022 2250   LABSPEC 1.009 02/05/2022 2250   PHURINE 6.0 02/05/2022 2250   GLUCOSEU NEGATIVE 02/05/2022 2250   HGBUR NEGATIVE 02/05/2022 2250   BILIRUBINUR NEGATIVE 02/05/2022 2250   KETONESUR NEGATIVE 02/05/2022  2250   PROTEINUR 30 (A) 02/05/2022 2250   UROBILINOGEN 0.2 08/17/2011 0929   NITRITE NEGATIVE 02/05/2022 2250   LEUKOCYTESUR TRACE (A) 02/05/2022 2250   Sepsis Labs Recent Labs  Lab 02/05/22 2119  WBC 7.4   Microbiology No results found for this or any previous visit (from the past 240 hour(s)). Imaging ECHOCARDIOGRAM COMPLETE  Result Date: 02/06/2022    ECHOCARDIOGRAM REPORT   Patient Name:   Paula Hansen Date of Exam: 02/06/2022 Medical Rec #:  [id number]    Height:       63.0 in Accession #:    [id number]   Weight:       154.5 lb Date of Birth:  1935/02/08    BSA:          1.733 m Patient Age:    87 years     BP:           123/81 mmHg Patient Gender: F            HR:           96 bpm. Exam Location:  [facility] Procedure: 2D Echo Indications:     TIA G45.9  History:         Patient has no prior history of Echocardiogram examinations.  Sonographer:     Overton Mam RDCS Referring Phys:  [id number] MATTHEW M ECKSTAT Diagnosing Phys: Julien Nordmann MD IMPRESSIONS  1. Left ventricular ejection fraction, by estimation, is 60 to 65%. The left ventricle has normal function. The left ventricle has no regional wall motion abnormalities. There is moderate left ventricular hypertrophy. Left ventricular diastolic parameters are consistent with Grade I diastolic dysfunction (impaired relaxation).  2. Right ventricular systolic function is normal. The right ventricular size is normal.  3. The mitral valve is normal in structure. Mild mitral valve regurgitation. No evidence of mitral stenosis.  4. The aortic valve is normal in structure. Aortic valve regurgitation is not visualized. Aortic valve sclerosis/calcification is present, without any evidence of aortic stenosis.  5. The inferior vena cava is normal in size with greater than 50% respiratory variability, suggesting right atrial pressure of 3 mmHg. FINDINGS  Left Ventricle: Left ventricular ejection fraction, by estimation, is 60 to 65%. The left ventricle has  normal function. The left ventricle has no regional wall motion abnormalities. The left ventricular internal cavity size was normal in size. There is  moderate left ventricular hypertrophy. Left ventricular diastolic parameters are consistent with Grade I diastolic dysfunction (impaired relaxation). Right Ventricle: The right ventricular size is normal. No increase in right ventricular wall thickness. Right ventricular systolic function is normal. Left Atrium: Left atrial size was normal in size. Right Atrium: Right atrial size was normal in size. Pericardium: There is no evidence of pericardial effusion. Mitral Valve: The mitral valve is normal in structure. Mild mitral annular calcification. Mild mitral valve regurgitation. No evidence of mitral valve stenosis. Tricuspid Valve: The tricuspid valve is normal in structure. Tricuspid valve regurgitation is mild . No evidence of tricuspid stenosis. Aortic Valve: The aortic valve is normal in structure. Aortic valve regurgitation is not visualized. Aortic valve sclerosis/calcification is present, without any evidence of aortic stenosis. Aortic valve peak gradient measures 10.2 mmHg. Pulmonic Valve: The pulmonic valve was normal in structure. Pulmonic valve regurgitation is not visualized. No evidence of pulmonic stenosis. Aorta: The aortic root is normal in size and structure. Venous: The inferior vena cava is normal in size with greater than 50% respiratory variability, suggesting right atrial pressure of 3 mmHg. IAS/Shunts: No atrial level shunt detected by color flow Doppler.  LEFT VENTRICLE PLAX 2D LVIDd:         3.50 cm     Diastology LVIDs:         2.20 cm     LV e' medial:    5.87 cm/s LV PW:         1.60 cm     LV E/e' medial:  25.6 LV IVS:        1.40 cm     LV e' lateral:   5.66 cm/s LVOT diam:     2.00 cm     LV E/e' lateral: 26.5 LV SV:         60 LV SV Index:   35 LVOT Area:     3.14 cm  LV Volumes (MOD) LV vol d, MOD A2C: 47.1 ml LV vol d, MOD A4C: 40.6  ml LV vol s, MOD A2C: 18.5 ml LV vol s, MOD A4C: 13.3 ml LV SV MOD A2C:     28.6 ml LV SV MOD A4C:     40.6 ml LV SV MOD BP:      28.3 ml RIGHT VENTRICLE RV Basal diam:  2.20 cm RV S prime:     13.60 cm/s LEFT ATRIUM             Index        RIGHT ATRIUM          Index LA diam:        2.80 cm 1.62 cm/m   RA Area:     9.34 cm LA Vol (A2C):   46.9 ml 27.07 ml/m  RA Volume:   16.90 ml 9.75 ml/m LA Vol (A4C):   29.6 ml 17.08 ml/m LA Biplane Vol: 39.9 ml 23.03 ml/m  AORTIC VALVE                 PULMONIC VALVE AV Area (Vmax): 1.96 cm     PV Vmax:       1.07 m/s AV Vmax:        160.00 cm/s  PV Peak grad:  4.6 mmHg AV Peak Grad:   10.2 mmHg LVOT Vmax:      99.70 cm/s LVOT Vmean:     67.700 cm/s LVOT VTI:       0.191 m  AORTA Ao Root diam: 3.20 cm Ao Asc diam:  3.20 cm MITRAL VALVE MV Area (PHT): 6.37 cm     SHUNTS MV Decel Time: 119 msec     Systemic VTI:  0.19 m MV E velocity: 150.00 cm/s  Systemic Diam: 2.00 cm Julien Nordmannimothy Gollan MD Electronically signed by Julien Nordmannimothy Gollan MD Signature Date/Time: 02/06/2022/12:27:25 PM    Final    CT ANGIO HEAD NECK W WO CM W PERF  Result Date: 02/06/2022 CLINICAL DATA:  Code stroke.  MRI demonstrated no acute infarcts. EXAM: CT ANGIOGRAPHY HEAD AND NECK TECHNIQUE: Multidetector CT imaging of the head and neck was performed using the standard protocol during bolus administration of intravenous contrast. Multiplanar CT image reconstructions and MIPs were obtained to evaluate the vascular anatomy. Carotid stenosis measurements (when applicable) are obtained utilizing NASCET criteria, using the distal internal carotid diameter as the denominator. RADIATION DOSE REDUCTION: This exam was performed according to the departmental dose-optimization program which includes automated exposure control, adjustment of the mA and/or kV according to patient size and/or use of iterative reconstruction technique. CONTRAST:  100mL OMNIPAQUE IOHEXOL 350 MG/ML SOLN COMPARISON:  CT head without contrast  02/05/2022. MR head without contrast 02/06/2022. MRA of the head 07/15/2021. FINDINGS: CTA NECK FINDINGS Aortic arch: Atherosclerotic calcifications are present at the aortic arch and great vessel origins. Common origin of the innominate artery and left common carotid artery is present. High-grade stenosis is present at the origin of the left subclavian artery. The lumen is narrowed to approximately 1 mm. This compares with a more distal vessel of up to 9 mm. No significant stenosis is present at the common origin of the innominate and left common carotid artery. Right carotid system: The right common carotid artery is mildly tortuous some calcification is present at its origin. No significant stenosis is present in the right common carotid artery. Dense calcifications are present along the medial aspect of the right carotid bifurcation and proximal ICA. No significant stenosis of greater than 50% is present. The more distal right common carotid artery demonstrates some distal mural calcification without significant stenosis. Left carotid system: The left common carotid artery demonstrates some proximal mural calcification without significant stenosis. Calcifications are present bifurcation proximal left ICA without a significant stenosis relative to the more distal vessel. Cervical left ICA is otherwise normal. Vertebral arteries: The right vertebral artery is the dominant vessel. It originates from the subclavian artery. The left vertebral artery is hypoplastic also originated from the left subclavian artery. A 50% stenosis is present in the left vertebral artery at the level C4-5. Left vertebral artery is occluded at the V3 segment at the level of C2. No significant stenosis present on the right. Skeleton: Multilevel degenerative changes are present cervical spine with leftward curvature compensating for rightward curvature in the thoracic spine. No  focal osseous lesions are present. Other neck: Soft tissues the  neck are otherwise unremarkable. Salivary glands are within normal limits. Thyroid is normal. No significant adenopathy is present. No focal mucosal or submucosal lesions are present. Upper chest: Lung apices demonstrate scattered ground-glass attenuation with some dependent atelectasis. No focal nodule or mass lesion is present. Thoracic inlet is within normal limits. Review of the MIP images confirms the above findings CTA HEAD FINDINGS Anterior circulation: Areas of 50% stenosis are present in the anterior genu of the right cavernous internal carotid artery. No greater stenoses are present on either side. ICA termini are within normal limits bilaterally. Left A1 segment is dominant. The right A1 segment demonstrates a high-grade stenosis or occlusion. There is some contrast in the more distal right A1 segment as seen on the prior MRI. The proximal right ACA vessel is occluded as before. There is some reconstitution of the more distal right ACA. Left ACA demonstrates no significant proximal stenosis or occlusion. The left M1 segment is within normal limits. The previously described irregularity of the ICA terminus appears to represent a 3 mm ICA terminus aneurysm directed superiorly and laterally. MCA bifurcations are within normal limits bilaterally. Diffuse irregularity is present within MCA branch vessels bilaterally without a significant proximal stenosis or occlusion. Asymmetric attenuation of left MCA branch vessels is similar to the prior exam. Posterior circulation: Left vertebral artery occlusion is chronic. High-grade stenosis of the distal right V4 segment again noted. The basilar artery is small centrally terminating at the superior cerebellar arteries. High-grade proximal basilar artery again noted as on the prior exam. Both posterior cerebral arteries originate from posterior communicating arteries. Similar branch vessel irregularity is present without a proximal occlusion or high-grade stenosis.  Venous sinuses: The dural sinuses are patent. The straight sinus and deep cerebral veins are intact. Cortical veins are within normal limits. No significant vascular malformation is evident. Anatomic variants: Fetal type posterior cerebral arteries. Review of the MIP images confirms the above findings IMPRESSION: 1. 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 2. 50% stenosis of the left vertebral artery at the level of C4-5. 3. 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 With minimal reconstitution at the dural margin in then in occlusion again in the V4 segment. 4. High-grade stenosis or occlusion of the right A1 segment with some contrast in the more distal right A1 segment as seen on the prior MRI. High-grade stenosis or occlusion also in the more distal right ACA, similar the prior exam. 5. Stable high-grade stenosis of the distal right V4 segment. 6. 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 7. 3 mm left ICA terminus aneurysm. This was described previously as irregularity. It appears to be a focal aneurysm, stable 8. 50% stenosis of the anterior genu of the right cavernous internal carotid artery. 9.  Aortic Atherosclerosis (ICD10-I70.0). Electronically Signed   By: Marin Roberts M.D.   On: 02/06/2022 12:11   MR BRAIN WO CONTRAST  Result Date: 02/06/2022 CLINICAL DATA:  Transient ischemic attack EXAM: MRI HEAD WITHOUT CONTRAST TECHNIQUE: Multiplanar, multiecho pulse sequences of the brain and surrounding structures were obtained without intravenous contrast. COMPARISON:  None Available. FINDINGS: Brain: No acute infarct, mass effect or extra-axial collection. Right cerebellar siderosis. There is multifocal hyperintense T2-weighted signal within the white matter. Generalized volume loss. 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 The midline structures are normal. Vascular: Major flow voids  are preserved. Skull and upper cervical spine: Normal calvarium and skull base. Visualized upper cervical spine and soft tissues are normal. Sinuses/Orbits:No paranasal sinus fluid levels or advanced mucosal thickening. No mastoid or middle ear effusion. Normal orbits. IMPRESSION: 1. No acute intracranial abnormality. 2. 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 Electronically Signed   By: Deatra Robinson M.D.   On: 02/06/2022 03:02      Time coordinating discharge: over 30 minutes  SIGNED:  Sunnie Nielsen DO [facility]

## 2022-02-07 NOTE — Plan of Care (Signed)
Patient is appropriate for discharge to home with assistance.

## 2022-02-07 NOTE — TOC Transition Note (Addendum)
Transition of Care [facility]) - CM/SW Discharge Note   Patient Details  Name: Paula Hansen MRN: [id number] Date of Birth: 02-Apr-1934  [facility]) CM/SW Contact:  Bing Quarry, RN Phone Number: 02/07/2022, 11:13 AM   Clinical Narrative: 12/17: [facility] per Reita Cliche A has accepted patient for [facility] PT/OT/RN and will add SLP as recommended after they do their assessment for a better start of service date. Orders modified to reflect that. Son was updated yesterday and will leave him an update today. Will call home to have aides transport patient to the home. Gabriel Cirri RN CM   1638 am: [phone] Home number that aides are to answer. NO answer x2.  Contacted Son to let him know of HH situation and contact information and that RN CM was unable to reach Home aides for transportation. He will attempt to reach them and have them call RN CM number. Son updated on [place] [facility] and to contact PCP if something falls through. Gabriel Cirri RN CM  Supervisor of [facility], Sula Rumple, called to let RN CM know aid is getting spouse ready and will come to pick patient up. Gave the unit secretary/unit phone number for aid to call when on the way so patient can be taken down to [facility]. Updated Unit RN. Gabriel Cirri RN CM   Sula Rumple [phone] on call this weekend as well. Updated her on the [facility] situation as well as she indicated they are very involved in care coordination as well.   [place], [place] [phone])  [phone] [facility] Phone)  [facility] Sula Rumple [phone] Supervisor     Barriers to Discharge: No Home Care Agency will accept this patient, Unsafe home situation   Patient Goals and CMS Choice        Discharge Placement                       Discharge Plan and Services                                     Social Determinants of Health (SDOH) Interventions Food Insecurity Interventions: Patient Refused Housing Interventions:  Patient Refused Utilities Interventions: Patient Refused   Readmission Risk Interventions     No data to display

## 2022-02-08 LAB — HEMOGLOBIN A1C
Hgb A1c MFr Bld: 7.2 % — ABNORMAL HIGH (ref 4.8–5.6)
Mean Plasma Glucose: 160 mg/dL

## 2022-02-10 ENCOUNTER — Emergency Department: Payer: Medicare Other

## 2022-02-10 ENCOUNTER — Inpatient Hospital Stay
Admission: EM | Admit: 2022-02-10 | Discharge: 2022-02-16 | DRG: 871 | Disposition: A | Discharge status: Home / Self Care | Payer: Medicare Other | Attending: Internal Medicine | Admitting: Internal Medicine

## 2022-02-10 ENCOUNTER — Other Ambulatory Visit: Payer: Self-pay

## 2022-02-10 DIAGNOSIS — I214 Non-ST elevation (NSTEMI) myocardial infarction: Secondary | ICD-10-CM | POA: Diagnosis present

## 2022-02-10 DIAGNOSIS — Z794 Long term (current) use of insulin: Secondary | ICD-10-CM | POA: Diagnosis not present

## 2022-02-10 DIAGNOSIS — Z515 Encounter for palliative care: Secondary | ICD-10-CM | POA: Diagnosis not present

## 2022-02-10 DIAGNOSIS — Z888 Allergy status to other drugs, medicaments and biological substances status: Secondary | ICD-10-CM

## 2022-02-10 DIAGNOSIS — E11649 Type 2 diabetes mellitus with hypoglycemia without coma: Secondary | ICD-10-CM | POA: Diagnosis present

## 2022-02-10 DIAGNOSIS — Z7982 Long term (current) use of aspirin: Secondary | ICD-10-CM

## 2022-02-10 DIAGNOSIS — E119 Type 2 diabetes mellitus without complications: Secondary | ICD-10-CM

## 2022-02-10 DIAGNOSIS — Z79899 Other long term (current) drug therapy: Secondary | ICD-10-CM

## 2022-02-10 DIAGNOSIS — E860 Dehydration: Secondary | ICD-10-CM | POA: Diagnosis present

## 2022-02-10 DIAGNOSIS — Z882 Allergy status to sulfonamides status: Secondary | ICD-10-CM

## 2022-02-10 DIAGNOSIS — Z803 Family history of malignant neoplasm of breast: Secondary | ICD-10-CM

## 2022-02-10 DIAGNOSIS — E875 Hyperkalemia: Secondary | ICD-10-CM | POA: Diagnosis present

## 2022-02-10 DIAGNOSIS — I129 Hypertensive chronic kidney disease with stage 1 through stage 4 chronic kidney disease, or unspecified chronic kidney disease: Secondary | ICD-10-CM | POA: Diagnosis present

## 2022-02-10 DIAGNOSIS — Z7984 Long term (current) use of oral hypoglycemic drugs: Secondary | ICD-10-CM

## 2022-02-10 DIAGNOSIS — N179 Acute kidney failure, unspecified: Secondary | ICD-10-CM | POA: Diagnosis present

## 2022-02-10 DIAGNOSIS — E039 Hypothyroidism, unspecified: Secondary | ICD-10-CM | POA: Diagnosis present

## 2022-02-10 DIAGNOSIS — I2489 Other forms of acute ischemic heart disease: Secondary | ICD-10-CM

## 2022-02-10 DIAGNOSIS — I10 Essential (primary) hypertension: Secondary | ICD-10-CM | POA: Diagnosis present

## 2022-02-10 DIAGNOSIS — E7800 Pure hypercholesterolemia, unspecified: Secondary | ICD-10-CM | POA: Diagnosis present

## 2022-02-10 DIAGNOSIS — N390 Urinary tract infection, site not specified: Secondary | ICD-10-CM | POA: Diagnosis present

## 2022-02-10 DIAGNOSIS — J9601 Acute respiratory failure with hypoxia: Secondary | ICD-10-CM | POA: Diagnosis present

## 2022-02-10 DIAGNOSIS — Z7902 Long term (current) use of antithrombotics/antiplatelets: Secondary | ICD-10-CM

## 2022-02-10 DIAGNOSIS — Z7189 Other specified counseling: Secondary | ICD-10-CM | POA: Diagnosis not present

## 2022-02-10 DIAGNOSIS — Z1152 Encounter for screening for COVID-19: Secondary | ICD-10-CM | POA: Diagnosis not present

## 2022-02-10 DIAGNOSIS — E162 Hypoglycemia, unspecified: Principal | ICD-10-CM

## 2022-02-10 DIAGNOSIS — N183 Chronic kidney disease, stage 3 unspecified: Secondary | ICD-10-CM | POA: Diagnosis present

## 2022-02-10 DIAGNOSIS — A419 Sepsis, unspecified organism: Secondary | ICD-10-CM | POA: Diagnosis present

## 2022-02-10 DIAGNOSIS — R159 Full incontinence of feces: Secondary | ICD-10-CM | POA: Diagnosis present

## 2022-02-10 DIAGNOSIS — G9340 Encephalopathy, unspecified: Secondary | ICD-10-CM | POA: Diagnosis not present

## 2022-02-10 DIAGNOSIS — Z8673 Personal history of transient ischemic attack (TIA), and cerebral infarction without residual deficits: Secondary | ICD-10-CM

## 2022-02-10 DIAGNOSIS — K219 Gastro-esophageal reflux disease without esophagitis: Secondary | ICD-10-CM | POA: Diagnosis present

## 2022-02-10 DIAGNOSIS — E876 Hypokalemia: Secondary | ICD-10-CM | POA: Diagnosis present

## 2022-02-10 DIAGNOSIS — E1122 Type 2 diabetes mellitus with diabetic chronic kidney disease: Secondary | ICD-10-CM | POA: Diagnosis present

## 2022-02-10 DIAGNOSIS — Z79890 Hormone replacement therapy (postmenopausal): Secondary | ICD-10-CM

## 2022-02-10 DIAGNOSIS — Z90710 Acquired absence of both cervix and uterus: Secondary | ICD-10-CM

## 2022-02-10 DIAGNOSIS — R6521 Severe sepsis with septic shock: Secondary | ICD-10-CM | POA: Diagnosis present

## 2022-02-10 DIAGNOSIS — G9341 Metabolic encephalopathy: Secondary | ICD-10-CM | POA: Diagnosis present

## 2022-02-10 DIAGNOSIS — F0390 Unspecified dementia without behavioral disturbance: Secondary | ICD-10-CM | POA: Diagnosis present

## 2022-02-10 DIAGNOSIS — R54 Age-related physical debility: Secondary | ICD-10-CM | POA: Diagnosis present

## 2022-02-10 DIAGNOSIS — Z96652 Presence of left artificial knee joint: Secondary | ICD-10-CM | POA: Diagnosis present

## 2022-02-10 DIAGNOSIS — J9602 Acute respiratory failure with hypercapnia: Secondary | ICD-10-CM | POA: Diagnosis present

## 2022-02-10 DIAGNOSIS — K529 Noninfective gastroenteritis and colitis, unspecified: Secondary | ICD-10-CM | POA: Diagnosis present

## 2022-02-10 DIAGNOSIS — E1140 Type 2 diabetes mellitus with diabetic neuropathy, unspecified: Secondary | ICD-10-CM | POA: Diagnosis present

## 2022-02-10 DIAGNOSIS — R32 Unspecified urinary incontinence: Secondary | ICD-10-CM | POA: Diagnosis present

## 2022-02-10 DIAGNOSIS — Z66 Do not resuscitate: Secondary | ICD-10-CM | POA: Diagnosis present

## 2022-02-10 DIAGNOSIS — A4159 Other Gram-negative sepsis: Secondary | ICD-10-CM | POA: Diagnosis present

## 2022-02-10 DIAGNOSIS — E872 Acidosis, unspecified: Secondary | ICD-10-CM | POA: Diagnosis present

## 2022-02-10 DIAGNOSIS — K5730 Diverticulosis of large intestine without perforation or abscess without bleeding: Secondary | ICD-10-CM | POA: Diagnosis present

## 2022-02-10 DIAGNOSIS — Z885 Allergy status to narcotic agent status: Secondary | ICD-10-CM

## 2022-02-10 DIAGNOSIS — Z91013 Allergy to seafood: Secondary | ICD-10-CM

## 2022-02-10 DIAGNOSIS — R6520 Severe sepsis without septic shock: Secondary | ICD-10-CM

## 2022-02-10 DIAGNOSIS — Z823 Family history of stroke: Secondary | ICD-10-CM

## 2022-02-10 DIAGNOSIS — E1142 Type 2 diabetes mellitus with diabetic polyneuropathy: Secondary | ICD-10-CM

## 2022-02-10 DIAGNOSIS — K8020 Calculus of gallbladder without cholecystitis without obstruction: Secondary | ICD-10-CM | POA: Diagnosis present

## 2022-02-10 DIAGNOSIS — B961 Klebsiella pneumoniae [K. pneumoniae] as the cause of diseases classified elsewhere: Secondary | ICD-10-CM | POA: Diagnosis present

## 2022-02-10 DIAGNOSIS — Z87891 Personal history of nicotine dependence: Secondary | ICD-10-CM

## 2022-02-10 LAB — CBC
HCT: 34.2 % — ABNORMAL LOW (ref 36.0–46.0)
Hemoglobin: 10.4 g/dL — ABNORMAL LOW (ref 12.0–15.0)
MCH: 31.8 pg (ref 26.0–34.0)
MCHC: 30.4 g/dL (ref 30.0–36.0)
MCV: 104.6 fL — ABNORMAL HIGH (ref 80.0–100.0)
Platelets: 289 10*3/uL (ref 150–400)
RBC: 3.27 MIL/uL — ABNORMAL LOW (ref 3.87–5.11)
RDW: 14.4 % (ref 11.5–15.5)
WBC: 17.6 10*3/uL — ABNORMAL HIGH (ref 4.0–10.5)
nRBC: 0 % (ref 0.0–0.2)

## 2022-02-10 LAB — BASIC METABOLIC PANEL
Anion gap: 28 — ABNORMAL HIGH (ref 5–15)
Anion gap: 21 — ABNORMAL HIGH (ref 5–15)
BUN: 107 mg/dL — ABNORMAL HIGH (ref 8–23)
BUN: 93 mg/dL — ABNORMAL HIGH (ref 8–23)
CO2: 9 mmol/L — ABNORMAL LOW (ref 22–32)
CO2: 12 mmol/L — ABNORMAL LOW (ref 22–32)
Calcium: 8.3 mg/dL — ABNORMAL LOW (ref 8.9–10.3)
Calcium: 7.8 mg/dL — ABNORMAL LOW (ref 8.9–10.3)
Chloride: 101 mmol/L (ref 98–111)
Chloride: 108 mmol/L (ref 98–111)
Creatinine, Ser: 8.43 mg/dL — ABNORMAL HIGH (ref 0.44–1.00)
Creatinine, Ser: 7.77 mg/dL — ABNORMAL HIGH (ref 0.44–1.00)
GFR, Estimated: 4 mL/min — ABNORMAL LOW (ref >60)
GFR, Estimated: 5 mL/min — ABNORMAL LOW (ref >60)
Glucose, Bld: 121 mg/dL — ABNORMAL HIGH (ref 70–99)
Glucose, Bld: 59 mg/dL — ABNORMAL LOW (ref 70–99)
Potassium: 7.4 mmol/L (ref 3.5–5.1)
Potassium: 7.2 mmol/L (ref 3.5–5.1)
Sodium: 138 mmol/L (ref 135–145)
Sodium: 141 mmol/L (ref 135–145)

## 2022-02-10 LAB — URINALYSIS, ROUTINE W REFLEX MICROSCOPIC
Bilirubin Urine: NEGATIVE
Glucose, UA: 50 mg/dL — AB
Ketones, ur: NEGATIVE mg/dL
Nitrite: NEGATIVE
Protein, ur: >=300 mg/dL — AB
RBC / HPF: >50 RBC/hpf — ABNORMAL HIGH (ref 0–5)
Specific Gravity, Urine: 1.017 (ref 1.005–1.030)
Squamous Epithelial / HPF: NONE SEEN (ref 0–5)
WBC, UA: >50 WBC/hpf — ABNORMAL HIGH (ref 0–5)
pH: 5 (ref 5.0–8.0)

## 2022-02-10 LAB — BLOOD GAS, VENOUS
Acid-base deficit: 14.7 mmol/L — ABNORMAL HIGH (ref 0.0–2.0)
Bicarbonate: 13.2 mmol/L — ABNORMAL LOW (ref 20.0–28.0)
O2 Saturation: 38.2 %
Patient temperature: 37
pCO2, Ven: 37 mmHg — ABNORMAL LOW (ref 44–60)
pH, Ven: 7.16 — CL (ref 7.25–7.43)
pO2, Ven: 34 mmHg (ref 32–45)

## 2022-02-10 LAB — CBG MONITORING, ED: Glucose-Capillary: 128 mg/dL — ABNORMAL HIGH (ref 70–99)

## 2022-02-10 LAB — LACTIC ACID, PLASMA
Lactic Acid, Venous: 6.5 mmol/L (ref 0.5–1.9)
Lactic Acid, Venous: 6.1 mmol/L (ref 0.5–1.9)

## 2022-02-10 LAB — TROPONIN I (HIGH SENSITIVITY)
Troponin I (High Sensitivity): 533 ng/L (ref <18)
Troponin I (High Sensitivity): 1036 ng/L (ref <18)

## 2022-02-10 LAB — PROCALCITONIN: Procalcitonin: 0.27 ng/mL

## 2022-02-10 LAB — CK: Total CK: 501 U/L — ABNORMAL HIGH (ref 38–234)

## 2022-02-10 LAB — RESP PANEL BY RT-PCR (RSV, FLU A&B, COVID)  RVPGX2
Influenza A by PCR: NEGATIVE
Influenza B by PCR: NEGATIVE
Resp Syncytial Virus by PCR: NEGATIVE
SARS Coronavirus 2 by RT PCR: NEGATIVE

## 2022-02-10 LAB — TSH: TSH: 1.623 u[IU]/mL (ref 0.350–4.500)

## 2022-02-10 MED ORDER — ACETAMINOPHEN 325 MG PO TABS: 650.0000 mg | ORAL_TABLET | Freq: Four times a day (QID) | ORAL | Status: DC | PRN

## 2022-02-10 MED ORDER — PANTOPRAZOLE SODIUM 40 MG PO TBEC
40.0000 mg | DELAYED_RELEASE_TABLET | Freq: Every day | ORAL | Status: DC
  Administered 2022-02-12 – 2022-02-16 (×5): 40 mg via ORAL
  Filled 2022-02-10 (×5): qty 1

## 2022-02-10 MED ORDER — ASPIRIN 81 MG PO TBEC
81.0000 mg | DELAYED_RELEASE_TABLET | ORAL | Status: DC
  Administered 2022-02-13 – 2022-02-15 (×2): 81 mg via ORAL
  Filled 2022-02-10 (×2): qty 1

## 2022-02-10 MED ORDER — LACTATED RINGERS IV SOLN: INTRAVENOUS | Status: DC

## 2022-02-10 MED ORDER — SODIUM BICARBONATE 8.4 % IV SOLN
INTRAVENOUS | Status: DC
  Filled 2022-02-10: qty 1000
  Filled 2022-02-10: qty 150
  Filled 2022-02-10 (×2): qty 1000

## 2022-02-10 MED ORDER — DEXTROSE 50 % IV SOLN
1.0000 | Freq: Once | INTRAVENOUS | Status: AC
  Administered 2022-02-10: 50 mL via INTRAVENOUS
  Filled 2022-02-10: qty 50

## 2022-02-10 MED ORDER — SODIUM BICARBONATE 8.4 % IV SOLN
50.0000 meq | Freq: Once | INTRAVENOUS | Status: AC
  Administered 2022-02-10: 50 meq via INTRAVENOUS
  Filled 2022-02-10: qty 50

## 2022-02-10 MED ORDER — LACTATED RINGERS IV BOLUS (SEPSIS)
1500.0000 mL | Freq: Once | INTRAVENOUS | Status: AC
  Administered 2022-02-10: 1500 mL via INTRAVENOUS

## 2022-02-10 MED ORDER — ACETAMINOPHEN 650 MG RE SUPP: 650.0000 mg | Freq: Four times a day (QID) | RECTAL | Status: DC | PRN

## 2022-02-10 MED ORDER — METRONIDAZOLE 500 MG/100ML IV SOLN
500.0000 mg | Freq: Two times a day (BID) | INTRAVENOUS | Status: DC
  Administered 2022-02-10 – 2022-02-11 (×2): 500 mg via INTRAVENOUS
  Filled 2022-02-10 (×2): qty 100

## 2022-02-10 MED ORDER — ONDANSETRON HCL 4 MG/2ML IJ SOLN: 4.0000 mg | Freq: Four times a day (QID) | INTRAMUSCULAR | Status: DC | PRN

## 2022-02-10 MED ORDER — VANCOMYCIN VARIABLE DOSE PER UNSTABLE RENAL FUNCTION (PHARMACIST DOSING): Status: DC

## 2022-02-10 MED ORDER — INSULIN ASPART 100 UNIT/ML IV SOLN
5.0000 [IU] | Freq: Once | INTRAVENOUS | Status: AC
  Administered 2022-02-10: 5 [IU] via INTRAVENOUS
  Filled 2022-02-10: qty 0.05

## 2022-02-10 MED ORDER — VANCOMYCIN HCL 1250 MG/250ML IV SOLN
1250.0000 mg | Freq: Once | INTRAVENOUS | Status: DC
  Filled 2022-02-10: qty 250

## 2022-02-10 MED ORDER — SODIUM ZIRCONIUM CYCLOSILICATE 10 G PO PACK
10.0000 g | PACK | Freq: Once | ORAL | Status: AC
  Administered 2022-02-10: 10 g via ORAL
  Filled 2022-02-10: qty 1

## 2022-02-10 MED ORDER — HEPARIN SODIUM (PORCINE) 5000 UNIT/ML IJ SOLN
5000.0000 [IU] | Freq: Three times a day (TID) | INTRAMUSCULAR | Status: DC
  Administered 2022-02-10: 5000 [IU] via SUBCUTANEOUS
  Filled 2022-02-10: qty 1

## 2022-02-10 MED ORDER — CALCIUM GLUCONATE-NACL 1-0.675 GM/50ML-% IV SOLN
1.0000 g | Freq: Once | INTRAVENOUS | Status: AC
  Administered 2022-02-10: 1000 mg via INTRAVENOUS
  Filled 2022-02-10: qty 50

## 2022-02-10 MED ORDER — DEXTROSE 50 % IV SOLN
2.0000 | Freq: Once | INTRAVENOUS | Status: AC
  Administered 2022-02-10: 100 mL via INTRAVENOUS
  Filled 2022-02-10: qty 100

## 2022-02-10 MED ORDER — SODIUM CHLORIDE 0.9 % IV SOLN
1.0000 g | Freq: Every day | INTRAVENOUS | Status: DC
  Administered 2022-02-10: 1 g via INTRAVENOUS
  Filled 2022-02-10: qty 10

## 2022-02-10 MED ORDER — SODIUM CHLORIDE 0.9 % IV SOLN: Freq: Once | INTRAVENOUS | Status: AC

## 2022-02-10 MED ORDER — PATIROMER SORBITEX CALCIUM 8.4 G PO PACK
16.8000 g | PACK | Freq: Every day | ORAL | Status: DC
  Filled 2022-02-10: qty 2

## 2022-02-10 MED ORDER — SODIUM CHLORIDE 0.9 % IV SOLN: INTRAVENOUS | Status: DC

## 2022-02-10 MED ORDER — DEXTROSE 5 % IV SOLN: INTRAVENOUS | Status: DC

## 2022-02-10 MED ORDER — SODIUM CHLORIDE 0.9 % IV BOLUS
1000.0000 mL | Freq: Once | INTRAVENOUS | Status: AC
  Administered 2022-02-10: 1000 mL via INTRAVENOUS

## 2022-02-10 MED ORDER — CLOPIDOGREL BISULFATE 75 MG PO TABS
75.0000 mg | ORAL_TABLET | Freq: Every day | ORAL | Status: DC
  Administered 2022-02-12 – 2022-02-16 (×5): 75 mg via ORAL
  Filled 2022-02-10 (×5): qty 1

## 2022-02-10 MED ORDER — INSULIN ASPART 100 UNIT/ML IJ SOLN
5.0000 [IU] | Freq: Once | INTRAMUSCULAR | Status: AC
  Administered 2022-02-10: 5 [IU] via INTRAVENOUS
  Filled 2022-02-10: qty 1

## 2022-02-10 MED ORDER — SODIUM CHLORIDE 0.9 % IV SOLN
1.0000 g | Freq: Once | INTRAVENOUS | Status: AC
  Administered 2022-02-10: 1 g via INTRAVENOUS
  Filled 2022-02-10: qty 10

## 2022-02-10 MED ORDER — VANCOMYCIN HCL 1500 MG/300ML IV SOLN
1500.0000 mg | Freq: Once | INTRAVENOUS | Status: AC
  Administered 2022-02-11: 1500 mg via INTRAVENOUS
  Filled 2022-02-10: qty 300

## 2022-02-10 MED ORDER — LEVOTHYROXINE SODIUM 50 MCG PO TABS
50.0000 ug | ORAL_TABLET | Freq: Every day | ORAL | Status: DC
  Administered 2022-02-11 – 2022-02-16 (×6): 50 ug via ORAL
  Filled 2022-02-10 (×7): qty 1

## 2022-02-10 MED ORDER — ONDANSETRON HCL 4 MG PO TABS: 4.0000 mg | ORAL_TABLET | Freq: Four times a day (QID) | ORAL | Status: DC | PRN

## 2022-02-10 MED ORDER — DEXTROSE 50 % IV SOLN: 1.0000 | Freq: Once | INTRAVENOUS | Status: DC

## 2022-02-10 MED ORDER — SODIUM CHLORIDE 0.9% FLUSH
3.0000 mL | Freq: Two times a day (BID) | INTRAVENOUS | Status: DC
  Administered 2022-02-11 – 2022-02-15 (×8): 3 mL via INTRAVENOUS

## 2022-02-10 MED ORDER — POLYETHYLENE GLYCOL 3350 17 G PO PACK: 17.0000 g | PACK | Freq: Every day | ORAL | Status: DC | PRN

## 2022-02-10 NOTE — ED Notes (Signed)
Pt in bed, pt oriented to person and place, doesn't know day of the week, re oriented pt, pt's fluids on fluid warmer, pt easily arousable to verbal stim, pt cool to the touch.

## 2022-02-10 NOTE — ED Notes (Addendum)
Pt asked to use restroom, this RN was not comfortable getting her out of bed due to pt's weakness and instability. Pt was put on the bedpan with the assistance of Feliz Beam, Charity fundraiser. Once pt completed BM, this RN was assisted by Kara Mead, Charity fundraiser to clean pt and resituate pt with bair hugger. Pt had a small soft, non-formed BM.

## 2022-02-10 NOTE — ED Notes (Signed)
Date and time results received: 02/10/22 1555 (use smartphrase ".now" to insert current time)  Test: Lac Critical Value: 6.5  Name of Provider Notified: Mumma

## 2022-02-10 NOTE — Assessment & Plan Note (Signed)
Initial troponin elevated at 500 with increased up to thousand.  Patient is chest pain-free with no EKG changes concerning for acute ischemia.  Likely demand in the setting of sepsis.  - No indication for heparin infusion at this time - Continue to trend troponin till peak - Stat EKG and consider initiation of heparin patient should develop chest pain

## 2022-02-10 NOTE — H&P (Addendum)
History and Physical    Patient: Paula Hansen GNF:[id number] DOB: 1934/11/14 DOA: 02/10/2022 DOS: the patient was seen and examined on 02/10/2022 PCP: Gracelyn Nurse, MD  Patient coming from: Home  Chief Complaint:  Chief Complaint  Patient presents with   Hypoglycemia   HPI: Paula Hansen is a 87 y.o. female with medical history significant of recent TIA, type 2 diabetes, CKD, hypertension, hypothyroidism, who presents to the ED due to altered mental status and hypoglycemia.  History obtained through chart review due to patient's altered mental status.  Paula Hansen states she cannot recall what occurred today that led to her being in the hospital. She believes she "had a spell." She does not believe she has been sick the past several days but does not recall being admitted to the hospital several days ago.  At this time, she denies any complaints including chest pain, shortness of breath, abdominal pain, nausea.  Collateral history obtained from patient's son, Paula Hansen.  Paula Hansen states that his mom has been having declining cognitive function and he believes there may be some underlying cognitive impairment.  She has poor short-term and long-term memory and occasionally has difficulty understanding concepts.  He notes that she is generally alert and oriented despite this though.  He was told by patient's home health nurse yesterday that Paula Hansen was not drinking or eating as much as normal.  In addition, patient's home health nurse reported one-time episode of urinary and bowel incontinence while trying to reach the restroom.  Otherwise, her home health nurse did not report any other symptoms including fever, chills, nausea, vomiting, diarrhea, dysuria or urinary changes.  Of note, patient was recently admitted from 12/15 to 12/17 for TIA.  ED course: On arrival to the ED, patient was hypothermic at 94.3 with blood pressure of 122/41 and heart rate of 77.  She was saturating at 98% on room  air.  Initial blood work remarkable for glucose of 128, VBG with pH of 7.16 and pCO2 of 37.  CBC with WBC of 17.6 and hemoglobin of 10.4.  BMP with potassium of 7.4 and creatinine of 8.4 with anion gap of 28.  Initial troponin elevated at 533 and initial lactic acid elevated at 6.5.  CT abdomen/pelvis demonstrating mild colitis.  Chest x-ray with no acute findings.  Due to concern for sepsis, patient was started on ceftriaxone.  She was also started on insulin with calcium gluconate.  Nephrology was consulted by EDP.  TRH contacted for admission.  Review of Systems: unable to review all systems due to the inability of the patient to answer questions.  Past Medical History:  Diagnosis Date   Arthritis    Diabetes mellitus    Diabetic neuropathy (HCC)    GERD (gastroesophageal reflux disease)    Hypercholesteremia    Hypertension    Hypothyroidism    Lumbar stenosis    L3-L4 left   PONV (postoperative nausea and vomiting)    Wears glasses    Wears partial dentures    lower   Past Surgical History:  Procedure Laterality Date   ABDOMINAL HYSTERECTOMY  1978   COLONOSCOPY     COLONOSCOPY W/ POLYPECTOMY     DILATION AND CURETTAGE OF UTERUS     JOINT REPLACEMENT     left knee October 2012.   KNEE ARTHROSCOPY W/ MENISCAL REPAIR     left knee   LUMBAR LAMINECTOMY/DECOMPRESSION MICRODISCECTOMY  08/17/2011   Procedure: LUMBAR LAMINECTOMY/DECOMPRESSION MICRODISCECTOMY 1 LEVEL;  Surgeon:  Clydene Fake, MD;  Location: [facility];  Service: Neurosurgery;  Laterality: Right;  Right Lumbar Two-Three Laminectomy/Diskectomy   LUMBAR LAMINECTOMY/DECOMPRESSION MICRODISCECTOMY Left 10/22/2015   Procedure: Laminectomy and Foraminotomy - Lumbar three-lumbar four, left;  Surgeon: Tia Alert, MD;  Location: [facility];  Service: Neurosurgery;  Laterality: Left;   TOOTH EXTRACTION     Social History:  reports that she has quit smoking. Her smoking use included cigarettes. She has never used smokeless  tobacco. She reports that she does not drink alcohol and does not use drugs.  Allergies  Allergen Reactions   Nickel Other (See Comments)    BREAKING OUT IN MOUTH IRRITATION RINGING IN THE EARS   Atorvastatin Other (See Comments)    Muscle Pain   Ezetimibe-Simvastatin Other (See Comments)    MYALGIAS   Pravastatin Other (See Comments)    MYALGIAS   Simvastatin Other (See Comments)    MYALGIAS   Amoxicillin Other (See Comments)    UNSPECIFIED REACTION    Codeine Other (See Comments)    Chest pain   Morphine And Related Nausea And Vomiting   Oxycodone Nausea And Vomiting   Shellfish Allergy Nausea And Vomiting   Sulfa Antibiotics Nausea Only and Other (See Comments)    Upset stomach with burning     Family History  Problem Relation Age of Onset   Breast cancer Maternal Aunt        62's   Stroke Mother    Stroke Father    Cancer - Other Brother    Breast cancer Cousin     Prior to Admission medications   Medication Sig Start Date End Date Taking? Authorizing Provider  amLODipine-benazepril (LOTREL) 10-20 MG per capsule Take 1 capsule by mouth daily.   Yes [provider]  aspirin EC 81 MG tablet Take 81 mg by mouth every other day.    Yes [provider]  Biotin 2.5 MG TABS Take 2,500 mcg by mouth daily.    Yes [provider]  Calcium Carbonate-Vit D-Min (CALTRATE PLUS PO) Take 1 tablet by mouth daily.   Yes [provider]  cetirizine (ZYRTEC) 10 MG tablet Take 10 mg by mouth daily.   Yes [provider]  Cholecalciferol (VITAMIN D-1000 MAX ST) 1000 units tablet Take 1,000 Units by mouth daily.    Yes [provider]  clopidogrel (PLAVIX) 75 MG tablet Take by mouth. 06/15/21 06/15/22 Yes [provider]  Coenzyme Q10 10 MG capsule Take 10 mg by mouth daily.   Yes [provider]  esomeprazole (NEXIUM) 40 MG capsule Take 40 mg by mouth daily before breakfast.   Yes [provider]  Flaxseed,  Linseed, (FLAXSEED OIL) 1000 MG CAPS Take 1,000 mg by mouth daily.   Yes [provider]  gabapentin (NEURONTIN) 300 MG capsule Take 300 mg by mouth 3 (three) times daily.   Yes [provider]  glipiZIDE (GLUCOTROL XL) 10 MG 24 hr tablet Take 10 mg by mouth 2 (two) times daily.  08/02/15  Yes [provider]  levothyroxine (SYNTHROID, LEVOTHROID) 50 MCG tablet Take 50 mcg by mouth daily before breakfast.  08/19/15  Yes [provider]  metFORMIN (GLUCOPHAGE) 500 MG tablet Take 1,000 mg by mouth 2 (two) times daily with a meal.    Yes [provider]  Multiple Vitamin (MULTIVITAMIN WITH MINERALS) TABS Take 1 tablet by mouth daily.   Yes [provider]  pioglitazone (ACTOS) 30 MG tablet Take 30  mg by mouth daily. 07/20/21  Yes [provider]  Probiotic Product (ALIGN PO) Take by mouth.   Yes [provider]  rosuvastatin (CRESTOR) 40 MG tablet Take 0.5 tablets (20 mg total) by mouth every other day for 6 days, THEN 1 tablet (40 mg total) every other day. 02/08/22 03/16/22 Yes Sunnie NielsenAlexander, Natalie, DO  vitamin E 400 UNIT capsule Take 400 Units by mouth daily.   Yes [provider]  naproxen sodium (ANAPROX) 220 MG tablet Take 220 mg by mouth 2 (two) times daily as needed (for pain).     [provider]    Physical Exam: Vitals:   02/10/22 1815 02/10/22 1830 02/10/22 1932 02/10/22 2041  BP:   (!) 102/43 (!) 106/49  Pulse: 88 83 (!) 1 76  Resp:  (!) 25 19 18   Temp:    98.3 F (36.8 C)  TempSrc:    Rectal  SpO2:  94% 92% 91%  Weight:      Height:       Physical Exam Vitals and nursing note reviewed.  Constitutional:      General: She is not in acute distress.    Appearance: She is normal weight. She is ill-appearing.  HENT:     Head: Normocephalic.     Comments: Bruise present on patient's forehead that appears to be in the healing stages.    Mouth/Throat:     Pharynx: Oropharynx is clear. No posterior  oropharyngeal erythema.     Comments: Extremely dry oropharynx Eyes:     General: No scleral icterus.    Extraocular Movements: Extraocular movements intact.     Conjunctiva/sclera: Conjunctivae normal.     Pupils: Pupils are equal, round, and reactive to light.  Neck:     Vascular: No JVD.  Cardiovascular:     Rate and Rhythm: Normal rate and regular rhythm.     Heart sounds: No murmur heard. Pulmonary:     Effort: No tachypnea, accessory muscle usage or respiratory distress.     Breath sounds: Rales (Bibasilar rales) present. No decreased breath sounds, wheezing or rhonchi.  Musculoskeletal:     Cervical back: Neck supple.     Right lower leg: No edema.     Left lower leg: No edema.  Skin:    General: Skin is warm and dry.     Comments: Bruises overlying bilateral lower extremities in various stages of healing.  No other rash, purpura or petechiae  Neurological:     Mental Status: She is alert.     Comments:  Patient is alert and oriented to person, place, time and situation although she cannot recall what occurred prior to arrival to the ED.  Impaired memory with inability to recall symptoms or events prior to admission including recent hospitalization  5 out of 5 strength throughout No tremor or abnormal tone  Psychiatric:        Mood and Affect: Mood normal.        Behavior: Behavior normal.        Cognition and Memory: Memory is impaired.     Data Reviewed: Initial CBC with WBC of 17.6, hemoglobin of 10.4, MCV of 104, platelets of 289. BMP with potassium of 7.4, bicarb of 9, glucose of 121, BUN of 107, creatinine of 8.4, calcium of 8.3 and anion gap of 28. Initial troponin elevated at 533 with increased to 1036. CK elevated at 501 TSH within normal limits Lactic acid elevated at 6.5 with decreased to 6.1 Repeat BMP  with potassium of 7.2 and creatinine of 7.77 with BUN of 93. Urinalysis with turbid appearance, glucosuria, hematuria, proteinuria, large leukocytes,  many bacteria and over 50 RBCs and WBCs.  EKG personally reviewed.Sinus rhythm with a rate of 78.  No T wave peaking.  No ST depression or T wave inversion.  No axis deviation.  CT ABDOMEN PELVIS WO CONTRAST  Result Date: 02/10/2022 CLINICAL DATA:  Sepsis. EXAM: CT ABDOMEN AND PELVIS WITHOUT CONTRAST TECHNIQUE: Multidetector CT imaging of the abdomen and pelvis was performed following the standard protocol without IV contrast. RADIATION DOSE REDUCTION: This exam was performed according to the departmental dose-optimization program which includes automated exposure control, adjustment of the mA and/or kV according to patient size and/or use of iterative reconstruction technique. COMPARISON:  None Available. FINDINGS: Lower chest: There is atelectasis in the lung bases. Hepatobiliary: Gallstones are present. There is no biliary ductal dilatation. The liver is within normal limits. Pancreas: Unremarkable. No pancreatic ductal dilatation or surrounding inflammatory changes. Spleen: Small in size.  No focal abnormality. Adrenals/Urinary Tract: Is a 2.3 cm hypodense area in the superior pole the left kidney measuring 35 Hounsfield units. There is some cortical scarring throughout the right kidney. There is a nonobstructing calculus measuring 3 mm in the superior pole the left kidney. There is no hydronephrosis or perinephric fat stranding. The adrenal glands and bladder are within normal limits. Stomach/Bowel: There is likely mild diffuse colonic wall thickening. There is no surrounding inflammation. There is colonic diverticulosis without evidence for acute diverticulitis. Small hiatal hernia is present. Stomach and small bowel are within normal limits. The appendix is within normal limits. Vascular/Lymphatic: Aortic atherosclerosis. No enlarged abdominal or pelvic lymph nodes. Reproductive: Status post hysterectomy. No adnexal masses. Other: No ascites.  Small fat containing umbilical hernia. Musculoskeletal: No  acute or significant osseous findings. IMPRESSION: 1. Mild diffuse colonic wall thickening worrisome for mild colitis. 2. Colonic diverticulosis without evidence for acute diverticulitis. 3. Cholelithiasis. 4. Nonobstructing left renal calculus. 5. 2.3 cm hypodense area in the superior pole the left kidney, indeterminate. Recommend further evaluation with ultrasound or MRI. Aortic Atherosclerosis (ICD10-I70.0). Electronically Signed   By: Darliss Cheney M.D.   On: 02/10/2022 17:12   DG Chest 2 View  Result Date: 02/10/2022 CLINICAL DATA:  Hypoxia. EXAM: CHEST - 2 VIEW COMPARISON:  Jul 19, 2018. FINDINGS: The heart size and mediastinal contours are within normal limits. Both lungs are clear. The visualized skeletal structures are unremarkable. IMPRESSION: No active cardiopulmonary disease. Electronically Signed   By: Lupita Raider M.D.   On: 02/10/2022 14:51   CT Head Wo Contrast  Result Date: 02/10/2022 CLINICAL DATA:  Altered mental status, hypoglycemia EXAM: CT HEAD WITHOUT CONTRAST TECHNIQUE: Contiguous axial images were obtained from the base of the skull through the vertex without intravenous contrast. RADIATION DOSE REDUCTION: This exam was performed according to the departmental dose-optimization program which includes automated exposure control, adjustment of the mA and/or kV according to patient size and/or use of iterative reconstruction technique. COMPARISON:  02/05/2022 FINDINGS: Brain: No acute intracranial findings are seen in noncontrast CT brain. There are no signs of bleeding within the cranium. Cortical sulci are prominent. There is decreased density in periventricular white matter. Possible small old lacunar infarct is seen in right thalamus. There is no focal edema or mass effect. Vascular: Scattered arterial calcifications are seen. Skull: Unremarkable. Sinuses/Orbits: Unremarkable Other: No significant interval changes are noted. IMPRESSION: No acute intracranial findings are seen in  noncontrast CT brain. Atrophy.  Small-vessel disease. Electronically Signed   By: Ernie Avena M.D.   On: 02/10/2022 14:43    Results are pending, will review when available.  Assessment and Plan: * Sepsis [facility]) Patient presenting with altered mental status found to hypothermic with leukocytosis, elevated lactic acid, and urinalysis consistent with urinary source of infection. CT abdomen/pelvis demonstrates mild colitis, however patient is not having any GI symptoms.  Patient is currently requiring 2 L of supplemental oxygen, however suspect this is due to sepsis rather than pneumonia as she is not endorsing any respiratory symptoms and chest x-ray is reassuring.  Blood pressures have initially been within normal limits, however have declined slowly despite receiving 4.5 L fluids.  Lactic acid with minimal improvement.  This is concerning for impending septic shock, however it may be due to vasodilatory effect of Bair hugger. SOFA score = 5, patient high risk for decompensation.  - Due to high risk of decompensation, critical care has been consulted; appreciate their recommendations - Continue IV fluid resuscitation - Continue Bair hugger given persistent hypothermia - Broaden antibiotic coverage to cefepime, vancomycin and Flagyl until clinical improvement or culture data results - Blood and urine cultures pending  Hyperkalemia Significant hyperkalemia with no EKG changes.  Likely in the setting of renal failure.  After receiving Lokelma and insulin, improvement only to 7.1 from 7.5.  - Nephrology consulted; appreciate their recommendations - Repeat insulin 5 units with D50 - Repeat calcium gluconate 1 g - Bicarb amp - Start bicarb infusion - Repeat potassium every 4 hours  AKI (acute kidney injury) (HCC) In the setting of sepsis and profound dehydration.  Unfortunately, patient is oliguric at this time.  - Nephrology consulted; appreciate their recommendations - Strict in and  out - Bladder scan every 8 hours - Avoid nephrotoxic agents - IV fluid resuscitation  Demand ischemia Initial troponin elevated at 500 with increased up to thousand.  Patient is chest pain-free with no EKG changes concerning for acute ischemia.  Likely demand in the setting of sepsis.  - No indication for heparin infusion at this time - Continue to trend troponin till peak - Stat EKG and consider initiation of heparin patient should develop chest pain  Acute encephalopathy Multifactorial in the setting of sepsis and uremic encephalopathy.  At this time, patient is protecting her airway.  Type II diabetes mellitus (HCC) - Given hypoglycemia, no indication for SSI - Hold home antiglycemic agents - Start D5 infusion  Hypothyroidism - Resume home Synthroid  Essential hypertension - Hold home antihypertensives  Advance Care Planning:   Code Status: Full Code.  When I discussed with Ms. Frary what she would want in the case of an emergency, she stated she would want full resuscitative efforts.  Although she is oriented, she has impaired memory and possibly impaired cognition.  I contacted patient's son Marchetta Navratil.  He states that patient's husband has significant dementia and is unable to make decisions.  He believes that his mom would not want full resuscitative efforts as she has previously declined full resuscitative efforts for her husband.  However, given how quickly she declined and the fact that he lives approximately 6 hours away, he would like her to remain full code at this time.  He will continue to discuss with his family patient's CODE STATUS.  He requests to be notified of any cardiac or pulmonary arrest.  Consults: Critical care, nephrology  Family Communication: Patient's son Onalee Hua updated via telephone  Severity of Illness: The appropriate patient status  for this patient is INPATIENT. Inpatient status is judged to be reasonable and necessary in order to provide the required  intensity of service to ensure the patient's safety. The patient's presenting symptoms, physical exam findings, and initial radiographic and laboratory data in the context of their chronic comorbidities is felt to place them at high risk for further clinical deterioration. Furthermore, it is not anticipated that the patient will be medically stable for discharge from the hospital within 2 midnights of admission.   * I certify that at the point of admission it is my clinical judgment that the patient will require inpatient hospital care spanning beyond 2 midnights from the point of admission due to high intensity of service, high risk for further deterioration and high frequency of surveillance required.*  Author: Verdene Lennert, MD 02/10/2022 10:07 PM  For on call review [email or web address].

## 2022-02-10 NOTE — ED Triage Notes (Signed)
Pt to er hallway 5 via ems, per ems pt sugar was 45 at home and pt was more sleepy than normal, states that they gave of d5 and the sugar was 62 then of d10.  Pt oriented to person and place, doesn't know day of the week, re oriented pt. Pt has contusion to forehead.

## 2022-02-10 NOTE — Assessment & Plan Note (Signed)
Hold home anti-hypertensives

## 2022-02-10 NOTE — ED Notes (Signed)
Pt moved to er room number 13, bear hugger placed, report to RN [employer]

## 2022-02-10 NOTE — Assessment & Plan Note (Addendum)
Patient presenting with altered mental status found to hypothermic with leukocytosis, elevated lactic acid, and urinalysis consistent with urinary source of infection. CT abdomen/pelvis demonstrates mild colitis, however patient is not having any GI symptoms.  Patient is currently requiring 2 L of supplemental oxygen, however suspect this is due to sepsis rather than pneumonia as she is not endorsing any respiratory symptoms and chest x-ray is reassuring.  Blood pressures have initially been within normal limits, however have declined slowly despite receiving 4.5 L fluids.  Lactic acid with minimal improvement.  This is concerning for impending septic shock, however it may be due to vasodilatory effect of Bair hugger. SOFA score = 5, patient high risk for decompensation.  - Due to high risk of decompensation, critical care has been consulted; appreciate their recommendations - Continue IV fluid resuscitation - Continue Bair hugger given persistent hypothermia - Broaden antibiotic coverage to cefepime, vancomycin and Flagyl until clinical improvement or culture data results - Blood and urine cultures pending

## 2022-02-10 NOTE — Assessment & Plan Note (Addendum)
Significant hyperkalemia with no EKG changes.  Likely in the setting of renal failure.  After receiving Lokelma and insulin, improvement only to 7.1 from 7.5.  - Nephrology consulted; appreciate their recommendations - Repeat insulin 5 units with D50 - Repeat calcium gluconate 1 g - Bicarb amp - Start bicarb infusion - Repeat potassium every 4 hours

## 2022-02-10 NOTE — Consult Note (Addendum)
Pharmacy Antibiotic Note  Paula Hansen is a 87 y.o. female admitted on 02/10/2022 with  sepsis of unknown origin . PMH significant for T2DM, CKD, hypothyroidism, GERD, neuropathy. Patient was recently discharged 02/07/22 after evaluation for an episode of word finding difficulty. No antibiotics given during that admission. Pharmacy has been consulted for vancomycin and cefepime dosing.  Plan: Day 1 of antibiotics Give Vancomycin 1500 mg IV x1  Patient in significant AKI, will dose by level given unstable renal function Check 24h random vancomycin level Initiate Cefepime 1g Q24H and adjust as renal function improves Continue to monitor renal function and follow culture results  Height: 5\' 3"  (160 cm) Weight: 70.3 kg (155 lb) IBW/kg (Calculated) : 52.4  Temp (24hrs), Avg:94.3 F (34.6 C), Min:94.3 F (34.6 C), Max:94.3 F (34.6 C)  Recent Labs  Lab 02/05/22 2119 02/10/22 1513 02/10/22 1515 02/10/22 1743 02/10/22 1825  WBC 7.4  --  17.6*  --   --   CREATININE 1.17*  --  8.43* 7.77*  --   LATICACIDVEN  --  6.5*  --   --  6.1*    Estimated Creatinine Clearance: 5 mL/min (A) (by C-G formula based on SCr of 7.77 mg/dL (H)).    Allergies  Allergen Reactions   Nickel Other (See Comments)    BREAKING OUT IN MOUTH IRRITATION RINGING IN THE EARS   Atorvastatin Other (See Comments)    Muscle Pain   Ezetimibe-Simvastatin Other (See Comments)    MYALGIAS   Pravastatin Other (See Comments)    MYALGIAS   Simvastatin Other (See Comments)    MYALGIAS   Amoxicillin Other (See Comments)    UNSPECIFIED REACTION    Codeine Other (See Comments)    Chest pain   Morphine And Related Nausea And Vomiting   Oxycodone Nausea And Vomiting   Shellfish Allergy Nausea And Vomiting   Sulfa Antibiotics Nausea Only and Other (See Comments)    Upset stomach with burning     Antimicrobials this admission: 12/20 Ceftriaxone x1 12/20 Cefepime >>  12/20 Vancomycin >>   Dose adjustments this  admission:  Microbiology results: 12/20 BCx: IP 12/20 UCx: ordered  12/20 MRSA PCR: ordered  Thank you for allowing pharmacy to be a part of this patient's care.  1/21, PharmD PGY1 Pharmacy Resident 02/10/2022 8:17 PM

## 2022-02-10 NOTE — Sepsis Progress Note (Signed)
Code sepsis protocol being monitored by eLink. 

## 2022-02-10 NOTE — Assessment & Plan Note (Signed)
In the setting of sepsis and profound dehydration.  Unfortunately, patient is oliguric at this time.  - Nephrology consulted; appreciate their recommendations - Strict in and out - Bladder scan every 8 hours - Avoid nephrotoxic agents - IV fluid resuscitation

## 2022-02-10 NOTE — ED Notes (Signed)
Pt o2 sat was found to be in the low 80's on RA while sleeping. RN woke pt up and applied 2l/min via [place] with improvement to 88%. RN titrated pt o2 up to 3l/min via [place]. Pt is now maintaining sat above 90%. RN notified provider.

## 2022-02-10 NOTE — ED Provider Notes (Signed)
[facility] Provider Note    Event Date/Time   First MD Initiated Contact with Patient 02/10/22 1346     (approximate)   History   Hypoglycemia   HPI  Paula Hansen is a 87 y.o. female past medical history significant for diabetes, CKD, presents to the emergency department with altered mental status and hypoglycemia.  EMS states that they were called out and found to have a glucose in the 40s.  Lives with her husband.  Patient is unable to provide any further history and she is uncertain of why she is here or what she remembers doing last.  Denies any nausea or vomiting.     Physical Exam   Triage Vital Signs: ED Triage Vitals  Enc Vitals Group     BP 02/10/22 1356 (!) 122/41     Pulse Rate 02/10/22 1356 77     Resp 02/10/22 1356 18     Temp 02/10/22 1408 (!) 94.3 F (34.6 C)     Temp Source 02/10/22 1408 Rectal     SpO2 02/10/22 1356 90 %     Weight 02/10/22 1352 155 lb (70.3 kg)     Height 02/10/22 1352 5\' 3"  (1.6 m)     Head Circumference --      Peak Flow --      Pain Score 02/10/22 1352 0     Pain Loc --      Pain Edu? --      Excl. in GC? --     Most recent vital signs: Vitals:   02/10/22 1356 02/10/22 1408  BP: (!) 122/41   Pulse: 77   Resp: 18   Temp:  (!) 94.3 F (34.6 C)  SpO2: 90%     Physical Exam Constitutional:      Appearance: She is well-developed. She is ill-appearing.  HENT:     Head: Atraumatic.  Eyes:     Conjunctiva/sclera: Conjunctivae normal.  Cardiovascular:     Rate and Rhythm: Regular rhythm.  Pulmonary:     Effort: No respiratory distress.  Abdominal:     General: There is no distension.     Tenderness: There is abdominal tenderness.     Comments: Suprapubic tenderness to palpation  Musculoskeletal:        General: Normal range of motion.     Cervical back: Normal range of motion.  Skin:    Comments: Cool to touch  Neurological:     Mental Status: She is alert. She is disoriented.      Comments: Oriented to person and place.  Following simple commands.  Moving all extremities.     IMPRESSION / MDM / ASSESSMENT AND PLAN / ED COURSE  I reviewed the triage vital signs and the nursing notes.  Differential diagnosis including hypoglycemia, sepsis, dehydration, electrolyte abnormalities, hypothyroidism, ACS  EKG  I, 02/12/22, the attending physician, personally viewed and interpreted this ECG.   Rate: Normal  Rhythm: Normal sinus  Axis: Right  Intervals: Prolonged PR interval prolonged QTc  ST&T Change: Nonspecific changes  No tachycardic or bradycardic dysrhythmias while on cardiac telemetry.  Blood cultures at obtained.  Patient found to be hypothermic.  Placed on [place] warmer.  Felt that 30 cc/kg of IV fluids may be detrimental to the patient, given 1 L of IV fluids and will reevaluate.  Started on IV Rocephin to cover empirically for possible urinary tract infection.  Lactic acid came back elevated 6.8.  Added on additional IV fluids  to complete 30 cc/kg of IV fluids.  CT abdomen pelvis with contrast to evaluate for intra-abdominal pathology.  No pain out of proportion.  LABS (all labs ordered are listed, but only abnormal results are displayed) Labs interpreted as -  Significant acidosis and elevated anion gap.  Significantly elevated creatinine at 8.43, appears to be prerenal with a BUN of 107, potassium significantly elevated at 7.4, CO2 is 9.  Concern for EKG changes.  Temporized for hyperkalemia.  Hold on insulin given her hypoglycemia.  Labs Reviewed  CBC - Abnormal; Notable for the following components:      Result Value   WBC 17.6 (*)    RBC 3.27 (*)    Hemoglobin 10.4 (*)    HCT 34.2 (*)    MCV 104.6 (*)    All other components within normal limits  BASIC METABOLIC PANEL - Abnormal; Notable for the following components:   Potassium 7.4 (*)    CO2 9 (*)    Glucose, Bld 121 (*)    BUN 107 (*)    Creatinine, Ser 8.43 (*)    Calcium 8.3 (*)     GFR, Estimated 4 (*)    Anion gap 28 (*)    All other components within normal limits  LACTIC ACID, PLASMA - Abnormal; Notable for the following components:   Lactic Acid, Venous 6.5 (*)    All other components within normal limits  BLOOD GAS, VENOUS - Abnormal; Notable for the following components:   pH, Ven 7.16 (*)    pCO2, Ven 37 (*)    Bicarbonate 13.2 (*)    Acid-base deficit 14.7 (*)    All other components within normal limits  CBG MONITORING, ED - Abnormal; Notable for the following components:   Glucose-Capillary 128 (*)    All other components within normal limits  TROPONIN I (HIGH SENSITIVITY) - Abnormal; Notable for the following components:   Troponin I (High Sensitivity) 533 (*)    All other components within normal limits  RESP PANEL BY RT-PCR (RSV, FLU A&B, COVID)  RVPGX2  CULTURE, BLOOD (ROUTINE X 2)  CULTURE, BLOOD (ROUTINE X 2)  LACTIC ACID, PLASMA  URINALYSIS, ROUTINE W REFLEX MICROSCOPIC  TSH  CBG MONITORING, ED  TROPONIN I (HIGH SENSITIVITY)    TREATMENT   IV for meds, IV Rocephin, calcium   PROCEDURES:  Critical Care performed: yes  .Critical Care  Performed by: Corena Herter, MD Authorized by: Corena Herter, MD   Critical care provider statement:    Critical care time (minutes):  40   Critical care time was exclusive of:  Separately billable procedures and treating other patients   Critical care was necessary to treat or prevent imminent or life-threatening deterioration of the following conditions:  Sepsis   Critical care was time spent personally by me on the following activities:  Development of treatment plan with patient or surrogate, discussions with consultants, evaluation of patient's response to treatment, examination of patient, ordering and review of laboratory studies, ordering and review of radiographic studies, ordering and performing treatments and interventions, pulse oximetry, re-evaluation of patient's condition and review of  old charts   Patient's presentation is most consistent with acute presentation with potential threat to life or bodily function.   MEDICATIONS ORDERED IN ED: Medications  cefTRIAXone (ROCEPHIN) 1 g in sodium chloride 0.9 % 100 mL IVPB (1 g Intravenous New Bag/Given 02/10/22 1605)  lactated ringers infusion (has no administration in time range)  lactated ringers bolus 1,500 mL (1,500 mLs  Intravenous New Bag/Given 02/10/22 1608)  calcium gluconate 1 g/ 50 mL sodium chloride IVPB (has no administration in time range)  patiromer Lelon Perla) packet 16.8 g (has no administration in time range)  sodium chloride 0.9 % bolus 1,000 mL (0 mLs Intravenous Stopped 02/10/22 1605)    FINAL CLINICAL IMPRESSION(S) / ED DIAGNOSES   Final diagnoses:  Hypoglycemia  Hyperkalemia     Rx / DC Orders   ED Discharge Orders     None        Note:  This document was prepared using Dragon voice recognition software and may include unintentional dictation errors.   Corena Herter, MD 02/10/22 [phone]

## 2022-02-10 NOTE — ED Notes (Signed)
Date and time results received: 02/10/22 1529 (use smartphrase ".now" to insert current time)  Test: vbg Critical Value: ph 7.16  Name of Provider Notified: Mumma

## 2022-02-10 NOTE — Consult Note (Addendum)
NAME:  Paula Hansen, MRN:  [id number], DOB:  10/25/1934, LOS: 0 ADMISSION DATE:  02/10/2022, CONSULTATION DATE:  02/10/22 REFERRING MD:  Verdene Lennert  CHIEF COMPLAINT:  AMS    HPI  87 y.o female with significant PMH of GERD, HTN, HLD, OA, Hypothyroidism, tSAH, CVA, T2DM, Neuropathy, CKD and Subclavian steal syndrome, who presented to the ED with chief complaints of AMS.  Per EMS run sheet and ED reports, EMS was called for AMS and on arrival patient was found laying in bed and responsive to verbal stimuli. Initial vs blood glucose of 45. EMS administered 100 ml of D5W  with improvement in responsiveness. Per patient's home health aide, patient was recently admitted for CVA ON Friday and since discharge she has been slow to wake up and has been too weak to tolerate po intake. Patient received additional 150 ml of D1O for BG of 62 and was transported to the ED.   ED Course: Initial vital signs showed HR of 77 beats/minute, BP 122/41 mm Hg, the RR 18 breaths/minute, and the oxygen saturation 90 % on RA and a temperature of 94.20F (34.6C).   Pertinent Labs/Diagnostics Findings: Chemistry:Na+/ K+:138/7.4  Glucose: 121 BUN/Cr.107/8.43 :Calcium: 8.3  AST/ALT:, CO2:9, Anion Gap: 28 CBC: WBC: 17.6 Hgb/Hct: 10.4/34.2  Other Lab findings:   PCT: pending Lactic acid:6.5  COVID PCR: Negative, Troponin: 533 Venous Blood Gas result:  pO2 34; pCO2 37; pH 7.16;  HCO3 13.2, %O2 Sat 38.2.  UA: +UTI Imaging: As detailed below  Patient given 30 cc/kg of fluids and started on broad-spectrum antibiotics Vanco cefepime and Flagyl for severe sepsis due to suspected UTI. Patient admitted to hospitalist service for further management of sepsis, severe AKI with AGMA. Due to severe AKI with hyperkalemia with concerns for possible requiring CRRT, patient received bicarb and was started on sodium bicarb gtt per Nephrology's recommendations. PCCM was consulted due to high risk for decompensation.  Past Medical History   GERD, HTN, HLD, OA, Hypothyroidism, tSAH, CVA, T2DM, Neuropathy, CKD and Subclavian steal syndrome  Significant Hospital Events   12/20: Admit to stepdown with severe sepsis secondary to UTI. PCCM consulted for severe AKI with AGMA and Lactic acidosis.  Consults:  Nephrology PCCM  Procedures:  None  Significant Diagnostic Tests:  02/10/22 CXR>  No active cardiopulmonary disease.   02/10/22 CTH> No acute intracranial findings    02/10/22 CT Abd/pelvis>I 1. Mild diffuse colonic wall thickening worrisome for mild colitis. 2. Colonic diverticulosis without evidence for acute diverticulitis. 3. Cholelithiasis. 4. Nonobstructing left renal calculus. 5. 2.3 cm hypodense area in the superior pole the left kidney, indeterminate. Recommend further evaluation with ultrasound or MRI. Micro Data:  12/20: SARS-CoV-2 PCR> negative 12/20: Influenza PCR> negative 12/20: Blood culture x2> 12/20: Urine Culture> 12/20: MRSA PCR>>   Antimicrobials:  Vancomycin 12/20> Cefepime 12/20> Metronidazole 12/20  OBJECTIVE  Blood pressure (!) 102/43, pulse (!) 1, temperature (!) 94.3 F (34.6 C), temperature source Rectal, resp. rate 19, height 5\' 3"  (1.6 m), weight 70.3 kg, SpO2 92 %.        Intake/Output Summary (Last 24 hours) at 02/10/2022 2025 Last data filed at 02/10/2022 1842 Gross per 24 hour  Intake 2605.47 ml  Output --  Net 2605.47 ml   Filed Weights   02/10/22 1352  Weight: 70.3 kg   Physical Examination  GENERAL:87 year-old critically ill patient lying in the bed with no acute distress.  EYES: Pupils equal, round, reactive to light and accommodation. No scleral icterus.  Extraocular muscles intact.  HEENT: Head atraumatic, normocephalic. Oropharynx and nasopharynx clear.  NECK:  Supple, no jugular venous distention. No thyroid enlargement, no tenderness.  LUNGS: Decreased breath sounds bilaterally, no wheezing, rales,or rhonchi . No use of accessory muscles of respiration.   CARDIOVASCULAR: S1, S2 normal. No murmurs, rubs, or gallops.  ABDOMEN: Soft, nontender, nondistended. Bowel sounds present. No organomegaly or mass.  EXTREMITIES: Upper and lower extremities with scattered bruising. Mild dependent edema of bilateral lower extremity. Full range of motion is noted to all joints. Muscle strength is 5/5 bilaterally.  Capillary refill is less than 3 seconds in all extremities. Pulses palpable distally. NEUROLOGIC:The patient is awake, alert and oriented to self only with normal speech. Motor function is normal with muscle strength 5/5 bilaterally to upper and lower extremities. Sensation is intact bilaterally.Cranial nerves are intact. Gait not checked.  PSYCHIATRIC: The patient is oriented to self only SKIN:         Labs/imaging that I havepersonally reviewed  (right click and "Reselect all SmartList Selections" daily)     Labs   CBC: Recent Labs  Lab 02/05/22 2119 02/10/22 1515  WBC 7.4 17.6*  NEUTROABS 5.4  --   HGB 11.7* 10.4*  HCT 36.9 34.2*  MCV 100.0 104.6*  PLT 292 [id number]    Basic Metabolic Panel: Recent Labs  Lab 02/05/22 2119 02/10/22 1515 02/10/22 1743  NA 139 138 141  K 4.2 7.4* 7.2*  CL 107 101 108  CO2 23 9* 12*  GLUCOSE 97 121* 59*  BUN 38* 107* 93*  CREATININE 1.17* 8.43* 7.77*  CALCIUM 9.4 8.3* 7.8*   GFR: Estimated Creatinine Clearance: 5 mL/min (A) (by C-G formula based on SCr of 7.77 mg/dL (H)). Recent Labs  Lab 02/05/22 2119 02/10/22 1513 02/10/22 1515 02/10/22 1825  WBC 7.4  --  17.6*  --   LATICACIDVEN  --  6.5*  --  6.1*    Liver Function Tests: Recent Labs  Lab 02/05/22 2119  AST 21  ALT 14  ALKPHOS 63  BILITOT 0.5  PROT 7.5  ALBUMIN 4.0   No results for input(s): "LIPASE", "AMYLASE" in the last 168 hours. No results for input(s): "AMMONIA" in the last 168 hours.  ABG    Component Value Date/Time   HCO3 13.2 (L) 02/10/2022 1513   ACIDBASEDEF 14.7 (H) 02/10/2022 1513   O2SAT 38.2 02/10/2022  1513     Coagulation Profile: Recent Labs  Lab 02/05/22 2119  INR 0.9    Cardiac Enzymes: Recent Labs  Lab 02/06/22 0437  CKTOTAL 81    HbA1C: Hgb A1c MFr Bld  Date/Time Value Ref Range Status  02/06/2022 04:37 AM 7.2 (H) 4.8 - 5.6 % Final    Comment:    (NOTE)         Prediabetes: 5.7 - 6.4         Diabetes: >6.4         Glycemic control for adults with diabetes: <7.0     CBG: Recent Labs  Lab 02/05/22 2100 02/06/22 1931 02/10/22 1348  GLUCAP 84 120* 128*    Review of Systems:   UNABLE TO OBTAIN DUE TO AMS  Past Medical History  She,  has a past medical history of Arthritis, Diabetes mellitus, Diabetic neuropathy ([place]), GERD (gastroesophageal reflux disease), Hypercholesteremia, Hypertension, Hypothyroidism, Lumbar stenosis, PONV (postoperative nausea and vomiting), Wears glasses, and Wears partial dentures.   Surgical History    Past Surgical History:  Procedure Laterality Date   ABDOMINAL HYSTERECTOMY  1978   COLONOSCOPY     COLONOSCOPY W/ POLYPECTOMY     DILATION AND CURETTAGE OF UTERUS     JOINT REPLACEMENT     left knee October 2012.   KNEE ARTHROSCOPY W/ MENISCAL REPAIR     left knee   LUMBAR LAMINECTOMY/DECOMPRESSION MICRODISCECTOMY  08/17/2011   Procedure: LUMBAR LAMINECTOMY/DECOMPRESSION MICRODISCECTOMY 1 LEVEL;  Surgeon: Otilio Connors, MD;  Location: [place] [facility];  Service: Neurosurgery;  Laterality: Right;  Right Lumbar Two-Three Laminectomy/Diskectomy   LUMBAR LAMINECTOMY/DECOMPRESSION MICRODISCECTOMY Left 10/22/2015   Procedure: Laminectomy and Foraminotomy - Lumbar three-lumbar four, left;  Surgeon: Eustace Moore, MD;  Location: [place] [facility];  Service: Neurosurgery;  Laterality: Left;   TOOTH EXTRACTION       Social History   reports that she has quit smoking. Her smoking use included cigarettes. She has never used smokeless tobacco. She reports that she does not drink alcohol and does not use drugs.   Family History   Her family  history includes Breast cancer in her cousin and maternal aunt; Cancer - Other in her brother; Stroke in her father and mother.   Allergies Allergies  Allergen Reactions   Nickel Other (See Comments)    BREAKING OUT IN MOUTH IRRITATION RINGING IN THE EARS   Atorvastatin Other (See Comments)    Muscle Pain   Ezetimibe-Simvastatin Other (See Comments)    MYALGIAS   Pravastatin Other (See Comments)    MYALGIAS   Simvastatin Other (See Comments)    MYALGIAS   Amoxicillin Other (See Comments)    UNSPECIFIED REACTION    Codeine Other (See Comments)    Chest pain   Morphine And Related Nausea And Vomiting   Oxycodone Nausea And Vomiting   Shellfish Allergy Nausea And Vomiting   Sulfa Antibiotics Nausea Only and Other (See Comments)    Upset stomach with burning      Home Medications  Prior to Admission medications   Medication Sig Start Date End Date Taking? Authorizing Provider  amLODipine-benazepril (LOTREL) 10-20 MG per capsule Take 1 capsule by mouth daily.   Yes [provider]  aspirin EC 81 MG tablet Take 81 mg by mouth every other day.    Yes [provider]  Biotin 2.5 MG TABS Take 2,500 mcg by mouth daily.    Yes [provider]  Calcium Carbonate-Vit D-Min (CALTRATE PLUS PO) Take 1 tablet by mouth daily.   Yes [provider]  cetirizine (ZYRTEC) 10 MG tablet Take 10 mg by mouth daily.   Yes [provider]  Cholecalciferol (VITAMIN D-1000 MAX ST) 1000 units tablet Take 1,000 Units by mouth daily.    Yes [provider]  clopidogrel (PLAVIX) 75 MG tablet Take by mouth. 06/15/21 06/15/22 Yes [provider]  Coenzyme Q10 10 MG capsule Take 10 mg by mouth daily.   Yes [provider]  esomeprazole (NEXIUM) 40 MG capsule Take 40 mg by mouth daily before breakfast.   Yes [provider]  Flaxseed, Linseed, (FLAXSEED OIL) 1000 MG CAPS Take 1,000 mg by mouth daily.   Yes [provider]   gabapentin (NEURONTIN) 300 MG capsule Take 300 mg by mouth 3 (three) times daily.   Yes [provider]  glipiZIDE (GLUCOTROL XL) 10 MG 24 hr tablet Take 10 mg by mouth 2 (two) times daily.  08/02/15  Yes [provider]  levothyroxine (SYNTHROID, LEVOTHROID) 50 MCG tablet Take 50 mcg by mouth daily before breakfast.  08/19/15  Yes  [provider]  metFORMIN (GLUCOPHAGE) 500 MG tablet Take 1,000 mg by mouth 2 (two) times daily with a meal.    Yes [provider]  Multiple Vitamin (MULTIVITAMIN WITH MINERALS) TABS Take 1 tablet by mouth daily.   Yes [provider]  pioglitazone (ACTOS) 30 MG tablet Take 30 mg by mouth daily. 07/20/21  Yes [provider]  Probiotic Product (ALIGN PO) Take by mouth.   Yes [provider]  rosuvastatin (CRESTOR) 40 MG tablet Take 0.5 tablets (20 mg total) by mouth every other day for 6 days, THEN 1 tablet (40 mg total) every other day. 02/08/22 03/16/22 Yes Sunnie Nielsen, DO  vitamin E 400 UNIT capsule Take 400 Units by mouth daily.   Yes [provider]  naproxen sodium (ANAPROX) 220 MG tablet Take 220 mg by mouth 2 (two) times daily as needed (for pain).     [provider]  Scheduled Meds:  heparin  5,000 Units Subcutaneous Q8H   sodium chloride flush  3 mL Intravenous Q12H   vancomycin variable dose per unstable renal function (pharmacist dosing)   Does not apply See admin instructions   Continuous Infusions:  sodium chloride 125 mL/hr at 02/10/22 2044   ceFEPime (MAXIPIME) IV 1 g (02/10/22 2052)   metronidazole     sodium bicarbonate 150 mEq in dextrose 5 % 1,150 mL infusion 75 mL/hr at 02/10/22 2105   vancomycin     PRN Meds:.acetaminophen **OR** acetaminophen, ondansetron **OR** ondansetron (ZOFRAN) IV, polyethylene glycol  Active Hospital Problem list     Assessment & Plan:  Sepsis due to suspected UTI Lactic: 6.5, Baseline PCT: pending, UA: +UTI,  Initial  interventions/workup included: 2 L of NS/LR 1.5 & Cefepime/ Vancomycin/ Metronidazole -Supplemental oxygen as needed, to maintain SpO2 > 90% -F/u cultures, trend lactic/ PCT -Monitor WBC/ fever curve -Continue broad spectrum antibiotics pending cultures -IVF hydration as needed -Pressors for MAP goal >65 -Strict I/O's   Elevated Troponin likely demand PMHx: HLD, HTN, recent CVA without residual deficit -Continuous cardiac monitoring -Trend HS Troponin until peaked  -Hold Amlodipine -Continue Rosuvastatin 40 mg -Continue DUAP, Aspirin and Plavix -Obtain 2D Echo   AKI on CKD stage III, likely ATN in the setting of sepsis and dehydration from poor po intake AGMA with Lactic Acidosis Hyperkalemia -Monitor I&O's / urinary output -Follow BMP -Start Bicarb gtt -Ensure adequate renal perfusion -Avoid nephrotoxic agents as able -Replace electrolytes as indicated -Nephrology consulted, appreciate input, recs no indication for HD, will start on bicarb gtt and trend BMP   Acute Metabolic Encephalopathy ~ ZD:GUYQIH CVA/TIA without residual deficit -Provide supportive care -Promote normal sleep/wake cycle -Avoid sedating meds as able -CT Head negative for acute intracranial abnormality   T2DM Now with Hypoglycemic episodes HgbA1c  -CBG's q4; Target range of 140 to 180 -SSI -Follow ICU Hypo/Hyperglycemia protocol -Hold Metformin, Actos and Glipizide  Hypothyroidism -TSH -Continue Synthroid  Best practice:  Diet:  Oral Pain/Anxiety/Delirium protocol (if indicated): No VAP protocol (if indicated): Not indicated DVT prophylaxis: Subcutaneous Heparin GI prophylaxis: PPI Glucose control:  SSI Yes Central venous access:  N/A Arterial line:  N/A Foley:  Yes, and it is still needed Mobility:  bed rest  PT consulted: N/A Last date of multidisciplinary goals of care discussion [12/20] Code Status:  full code Disposition: ICU   = Goals of Care = Code Status Order: FULL   Primary Emergency ContactJanan, Bogie, Home Phone: [phone] Wishes to pursue full aggressive treatment and intervention options, including CPR  and intubation, but goals of care will be addressed on going with family if that should become necessary.  Critical care time: 45 minutes       Rufina Falco, DNP, CCRN, FNP-C, AGACNP-BC Acute Care Nurse Practitioner [facility]  PCCM on call pager [phone] until 7 am

## 2022-02-10 NOTE — ED Notes (Signed)
Date and time results received: 02/10/22 1613 (use smartphrase ".now" to insert current time)  Test: Trop and K Critical Value: 533 and 7.4  Name of Provider Notified: Mumma

## 2022-02-10 NOTE — Consult Note (Signed)
CODE SEPSIS - PHARMACY COMMUNICATION  **Broad Spectrum Antibiotics should be administered within 1 hour of Sepsis diagnosis**  Time Code Sepsis Called/Page Received: 1554  Antibiotics Ordered: ceftriaxone  Time of 1st antibiotic administration: 1605      Sharen Hones ,PharmD, BCPS Clinical Pharmacist  02/10/2022  3:58 PM

## 2022-02-10 NOTE — Assessment & Plan Note (Signed)
-   Resume home Synthroid 

## 2022-02-10 NOTE — ED Notes (Signed)
Pt to CT

## 2022-02-10 NOTE — Assessment & Plan Note (Signed)
Multifactorial in the setting of sepsis and uremic encephalopathy.  At this time, patient is protecting her airway.

## 2022-02-10 NOTE — ED Notes (Signed)
Supply called for bear hugger blanket.

## 2022-02-10 NOTE — Assessment & Plan Note (Addendum)
-   Given hypoglycemia, no indication for SSI - Hold home antiglycemic agents - Start D5 infusion

## 2022-02-11 ENCOUNTER — Inpatient Hospital Stay: Payer: Medicare Other

## 2022-02-11 LAB — CBC WITH DIFFERENTIAL/PLATELET
Abs Immature Granulocytes: 0.11 10*3/uL — ABNORMAL HIGH (ref 0.00–0.07)
Basophils Absolute: 0 10*3/uL (ref 0.0–0.1)
Basophils Relative: 0 %
Eosinophils Absolute: 0 10*3/uL (ref 0.0–0.5)
Eosinophils Relative: 0 %
HCT: 25.5 % — ABNORMAL LOW (ref 36.0–46.0)
Hemoglobin: 8.1 g/dL — ABNORMAL LOW (ref 12.0–15.0)
Immature Granulocytes: 1 %
Lymphocytes Relative: 15 %
Lymphs Abs: 2.5 10*3/uL (ref 0.7–4.0)
MCH: 31.8 pg (ref 26.0–34.0)
MCHC: 31.8 g/dL (ref 30.0–36.0)
MCV: 100 fL (ref 80.0–100.0)
Monocytes Absolute: 2.4 10*3/uL — ABNORMAL HIGH (ref 0.1–1.0)
Monocytes Relative: 14 %
Neutro Abs: 11.9 10*3/uL — ABNORMAL HIGH (ref 1.7–7.7)
Neutrophils Relative %: 70 %
Platelets: 269 10*3/uL (ref 150–400)
RBC: 2.55 MIL/uL — ABNORMAL LOW (ref 3.87–5.11)
RDW: 14.5 % (ref 11.5–15.5)
WBC: 16.9 10*3/uL — ABNORMAL HIGH (ref 4.0–10.5)
nRBC: 0 % (ref 0.0–0.2)

## 2022-02-11 LAB — COMPREHENSIVE METABOLIC PANEL
ALT: 21 U/L (ref 0–44)
AST: 42 U/L — ABNORMAL HIGH (ref 15–41)
Albumin: 3 g/dL — ABNORMAL LOW (ref 3.5–5.0)
Alkaline Phosphatase: 49 U/L (ref 38–126)
Anion gap: 19 — ABNORMAL HIGH (ref 5–15)
BUN: 100 mg/dL — ABNORMAL HIGH (ref 8–23)
CO2: 16 mmol/L — ABNORMAL LOW (ref 22–32)
Calcium: 7.5 mg/dL — ABNORMAL LOW (ref 8.9–10.3)
Chloride: 106 mmol/L (ref 98–111)
Creatinine, Ser: 7.54 mg/dL — ABNORMAL HIGH (ref 0.44–1.00)
GFR, Estimated: 5 mL/min — ABNORMAL LOW (ref >60)
Glucose, Bld: 92 mg/dL (ref 70–99)
Potassium: 5.8 mmol/L — ABNORMAL HIGH (ref 3.5–5.1)
Sodium: 141 mmol/L (ref 135–145)
Total Bilirubin: 0.7 mg/dL (ref 0.3–1.2)
Total Protein: 5.7 g/dL — ABNORMAL LOW (ref 6.5–8.1)

## 2022-02-11 LAB — BLOOD GAS, ARTERIAL
Acid-base deficit: 11.6 mmol/L — ABNORMAL HIGH (ref 0.0–2.0)
Bicarbonate: 14.5 mmol/L — ABNORMAL LOW (ref 20.0–28.0)
O2 Content: 4 L/min
O2 Saturation: 92.8 %
Patient temperature: 37
pCO2 arterial: 33 mmHg (ref 32–48)
pH, Arterial: 7.25 — ABNORMAL LOW (ref 7.35–7.45)
pO2, Arterial: 71 mmHg — ABNORMAL LOW (ref 83–108)

## 2022-02-11 LAB — BASIC METABOLIC PANEL
Anion gap: 18 — ABNORMAL HIGH (ref 5–15)
BUN: 100 mg/dL — ABNORMAL HIGH (ref 8–23)
CO2: 15 mmol/L — ABNORMAL LOW (ref 22–32)
Calcium: 7.6 mg/dL — ABNORMAL LOW (ref 8.9–10.3)
Chloride: 109 mmol/L (ref 98–111)
Creatinine, Ser: 7.56 mg/dL — ABNORMAL HIGH (ref 0.44–1.00)
GFR, Estimated: 5 mL/min — ABNORMAL LOW (ref >60)
Glucose, Bld: 73 mg/dL (ref 70–99)
Potassium: 5.9 mmol/L — ABNORMAL HIGH (ref 3.5–5.1)
Sodium: 142 mmol/L (ref 135–145)

## 2022-02-11 LAB — GLUCOSE, CAPILLARY
Glucose-Capillary: 149 mg/dL — ABNORMAL HIGH (ref 70–99)
Glucose-Capillary: 52 mg/dL — ABNORMAL LOW (ref 70–99)
Glucose-Capillary: 104 mg/dL — ABNORMAL HIGH (ref 70–99)

## 2022-02-11 LAB — PROTIME-INR
INR: 1.1 (ref 0.8–1.2)
Prothrombin Time: 13.7 seconds (ref 11.4–15.2)

## 2022-02-11 LAB — MRSA NEXT GEN BY PCR, NASAL: MRSA by PCR Next Gen: DETECTED — AB

## 2022-02-11 LAB — MAGNESIUM: Magnesium: 1.7 mg/dL (ref 1.7–2.4)

## 2022-02-11 LAB — LACTIC ACID, PLASMA
Lactic Acid, Venous: 4.6 mmol/L (ref 0.5–1.9)
Lactic Acid, Venous: 4.3 mmol/L (ref 0.5–1.9)
Lactic Acid, Venous: 2.3 mmol/L (ref 0.5–1.9)

## 2022-02-11 LAB — TROPONIN I (HIGH SENSITIVITY)
Troponin I (High Sensitivity): 1913 ng/L (ref <18)
Troponin I (High Sensitivity): 2518 ng/L (ref <18)

## 2022-02-11 LAB — PROCALCITONIN: Procalcitonin: 0.49 ng/mL

## 2022-02-11 MED ORDER — ORAL CARE MOUTH RINSE: 15.0000 mL | OROMUCOSAL | Status: DC | PRN

## 2022-02-11 MED ORDER — ONDANSETRON HCL 4 MG/2ML IJ SOLN
4.0000 mg | Freq: Four times a day (QID) | INTRAMUSCULAR | Status: DC | PRN
  Administered 2022-02-14: 4 mg via INTRAVENOUS
  Filled 2022-02-11: qty 2

## 2022-02-11 MED ORDER — SODIUM BICARBONATE 8.4 % IV SOLN: 50.0000 meq | Freq: Once | INTRAVENOUS | Status: DC

## 2022-02-11 MED ORDER — CHLORHEXIDINE GLUCONATE CLOTH 2 % EX PADS
6.0000 | MEDICATED_PAD | Freq: Every day | CUTANEOUS | Status: DC
  Administered 2022-02-11 – 2022-02-14 (×4): 6 via TOPICAL

## 2022-02-11 MED ORDER — ACETAMINOPHEN 325 MG PO TABS: 650.0000 mg | ORAL_TABLET | Freq: Four times a day (QID) | ORAL | Status: DC | PRN

## 2022-02-11 MED ORDER — ONDANSETRON 4 MG PO TBDP: 4.0000 mg | ORAL_TABLET | Freq: Four times a day (QID) | ORAL | Status: DC | PRN

## 2022-02-11 MED ORDER — NOREPINEPHRINE 4 MG/250ML-% IV SOLN: 2.0000 ug/min | INTRAVENOUS | Status: DC

## 2022-02-11 MED ORDER — HALOPERIDOL LACTATE 5 MG/ML IJ SOLN: 2.5000 mg | INTRAMUSCULAR | Status: DC | PRN

## 2022-02-11 MED ORDER — GLYCOPYRROLATE 0.2 MG/ML IJ SOLN: 0.2000 mg | INTRAMUSCULAR | Status: DC | PRN

## 2022-02-11 MED ORDER — DEXTROSE 50 % IV SOLN
25.0000 g | INTRAVENOUS | Status: AC
  Administered 2022-02-11: 25 g via INTRAVENOUS

## 2022-02-11 MED ORDER — GLYCOPYRROLATE 1 MG PO TABS: 1.0000 mg | ORAL_TABLET | ORAL | Status: DC | PRN

## 2022-02-11 MED ORDER — POLYVINYL ALCOHOL 1.4 % OP SOLN: 1.0000 [drp] | Freq: Four times a day (QID) | OPHTHALMIC | Status: DC | PRN

## 2022-02-11 MED ORDER — LORAZEPAM 2 MG/ML IJ SOLN: 2.0000 mg | INTRAMUSCULAR | Status: DC | PRN

## 2022-02-11 MED ORDER — SODIUM CHLORIDE 0.9 % IV SOLN: INTRAVENOUS | Status: DC

## 2022-02-11 MED ORDER — SODIUM CHLORIDE 0.9 % IV SOLN
250.0000 mL | INTRAVENOUS | Status: DC
  Administered 2022-02-12: 250 mL via INTRAVENOUS

## 2022-02-11 MED ORDER — ACETAMINOPHEN 650 MG RE SUPP: 650.0000 mg | Freq: Four times a day (QID) | RECTAL | Status: DC | PRN

## 2022-02-11 MED ORDER — HEPARIN (PORCINE) 25000 UT/250ML-% IV SOLN
800.0000 [IU]/h | INTRAVENOUS | Status: DC
  Administered 2022-02-11: 800 [IU]/h via INTRAVENOUS

## 2022-02-11 MED ORDER — NOREPINEPHRINE 4 MG/250ML-% IV SOLN
INTRAVENOUS | Status: AC
  Administered 2022-02-11: 2 ug/min via INTRAVENOUS
  Filled 2022-02-11: qty 250

## 2022-02-11 MED ORDER — HYDROMORPHONE HCL 1 MG/ML IJ SOLN: 0.5000 mg | INTRAMUSCULAR | Status: DC | PRN

## 2022-02-11 NOTE — Progress Notes (Addendum)
Notified of critical value troponin levels [phone]. Per patient's ED RN, patient is getting more restless and agitated with increased oxygen requirement. She is having oxygen desaturation to mid 80's on 2L which was increased up to 4L. On arrival to the ICU, patient unable to tolerate lying flat, she again dropped her sats to upper 70s requiring increased oxygen up to 5L. Patient dyspneic with shallow respirations. Repeat Arterial Blood Gas result:  pO2 71; pCO2 33; pH 7.25;  HCO3 14.5, %O2 Sat 92.8.  Acute Hypoxic Hypercapnic Respiratory Failure secondary to  Concerns for developing pulmonary edema from fluid resuscitation, currently net I&O since admit +3,106, she is on continuous IVFs in addition to IV abx and heparin infusion. -Supplemental O2 as needed to maintain O2 saturations 88 to 92% -Place on HHFNC -High risk for intubation -Follow intermittent ABG and chest x-ray as needed -Decrease IVFs -As needed bronchodilators -Given worsening respiratory status and high risk for decompensation per [facility] attending's concerns, PCCM to assume care. Discuss with on call attending Dr. Aundria Rud.  Elevated Troponin, likely demand versus NSTEMI No evidence of dynamic EKG changes troponin levels [phone]. -Continuous cardiac monitoring -Trend HS Troponin until peaked  -will start Heparin gtt -DUAP therapy with aspirin and Plavix -Crestor 40mg  PO daily -Cardiology consult if appropriate -Obtain 2D Echo   AKI on CKD Stage III~Improving Hyperkalemia Cr improved 8.43~7.77~7.56 Acidosis is improving, will decrease bicarb gtt to avoid rapid correction  -Monitor I&O's / urinary output -Follow BMP Q4hrs -Continue Bicarb gtt -Ensure adequate renal perfusion -Avoid nephrotoxic agents as able -Replace electrolytes as indicated       , DNP, CCRN, FNP-C, AGACNP-BC Acute Care & Family Nurse Practitioner  [facility]  See Amion for personal  pager PCCM on call pager [phone] until 7 am

## 2022-02-11 NOTE — Consult Note (Signed)
[facility] Consult Note: 02/11/22     Date of Admission:  02/10/2022           Reason for Consult:  AKI, hyperkalemia   Referring Provider: [name], MD Primary Care Provider: Gracelyn Nurse, MD   History of Presenting Illness:  Paula Hansen is a 87 y.o. female with medical problems of diabetes, hypertension, CKD, TIA, hyper thyroidism presented on 12/20 with altered mental status, decreased oral intake and 1 episode of bowel and bladder incontinence.  She was diagnosed with sepsis secondary to colitis.  She was found to have AKI and severe hyperkalemia with potassium of 7.4.  She was also diagnosed with non-STEMI. She was given fluid resuscitation and heparin drip.  Shifting measures were used for hyperkalemia.  This morning, pH has improved to 7.25, potassium is at 5.9.  However BUN and creatinine remain elevated.  No urine output recorded. This morning, patient appears to have dry mouth.  She is getting IV fluids.  Blood pressure is low normal. Patient is not fully oriented.  She states that she lives with her husband while in fact her husband has passed away    Review of Systems: ROS-as in HPI Unreliable due to patient illness and dementia.  Past Medical History:  Diagnosis Date   Arthritis    Diabetes mellitus    Diabetic neuropathy (HCC)    GERD (gastroesophageal reflux disease)    Hypercholesteremia    Hypertension    Hypothyroidism    Lumbar stenosis    L3-L4 left   PONV (postoperative nausea and vomiting)    Wears glasses    Wears partial dentures    lower    Social History   Tobacco Use   Smoking status: Former    Years: 30.00    Types: Cigarettes   Smokeless tobacco: Never   Tobacco comments:    quit  in 2000-2001  Substance Use Topics   Alcohol use: No   Drug use: No    Family History  Problem Relation Age of Onset   Breast cancer Maternal Aunt        66's   Stroke Mother    Stroke Father    Cancer - Other  Brother    Breast cancer Cousin      OBJECTIVE: Blood pressure (!) 133/53, pulse 87, temperature (!) 97.3 F (36.3 C), resp. rate 18, height 5\' 3"  (1.6 m), weight 70.3 kg, SpO2 91 %.  Physical Exam Physical Exam: General:  No acute distress, laying in the bed, Frail, elderly  HEENT  anicteric, dry oral mucous membrane  Pulm/lungs  normal breathing effort, decreased breath sounds at bases  CVS/Heart  irregular rhythm, no rub or gallop  Abdomen:   Soft, nontender  Extremities:  No peripheral edema  Neurologic:  Alert, oriented to self, able to follow commands  Skin:  No acute rashes     Lab Results Lab Results  Component Value Date   WBC 17.6 (H) 02/10/2022   HGB 10.4 (L) 02/10/2022   HCT 34.2 (L) 02/10/2022   MCV 104.6 (H) 02/10/2022   PLT 289 02/10/2022    Lab Results  Component Value Date   CREATININE 7.56 (H) 02/10/2022   BUN 100 (H) 02/10/2022   NA 142 02/10/2022   K 5.9 (H) 02/10/2022   CL 109 02/10/2022   CO2 15 (L) 02/10/2022    Lab Results  Component Value Date   ALT 14 02/05/2022   AST 21 02/05/2022  ALKPHOS 63 02/05/2022   BILITOT 0.5 02/05/2022     Microbiology: Recent Results (from the past 240 hour(s))  Resp panel by RT-PCR (RSV, Flu A&B, Covid) Anterior Nasal Swab     Status: None   Collection Time: 02/10/22  3:13 PM   Specimen: Anterior Nasal Swab  Result Value Ref Range Status   SARS Coronavirus 2 by RT PCR NEGATIVE NEGATIVE Final    Comment: (NOTE) SARS-CoV-2 target nucleic acids are NOT DETECTED.  The SARS-CoV-2 RNA is generally detectable in upper respiratory specimens during the acute phase of infection. The lowest concentration of SARS-CoV-2 viral copies this assay can detect is 138 copies/mL. A negative result does not preclude SARS-Cov-2 infection and should not be used as the sole basis for treatment or other patient management decisions. A negative result may occur with  improper specimen collection/handling, submission of  specimen other than nasopharyngeal swab, presence of viral mutation(s) within the areas targeted by this assay, and inadequate number of viral copies(<138 copies/mL). A negative result must be combined with clinical observations, patient history, and epidemiological information. The expected result is Negative.  Fact Sheet for Patients:  BloggerCourse.com  Fact Sheet for Healthcare Providers:  SeriousBroker.it  This test is no t yet approved or cleared by the [place] FDA and  has been authorized for detection and/or diagnosis of SARS-CoV-2 by FDA under an Emergency Use Authorization (EUA). This EUA will remain  in effect (meaning this test can be used) for the duration of the COVID-19 declaration under Section 564(b)(1) of the Act, 21 U.S.C.section 360bbb-3(b)(1), unless the authorization is terminated  or revoked sooner.       Influenza A by PCR NEGATIVE NEGATIVE Final   Influenza B by PCR NEGATIVE NEGATIVE Final    Comment: (NOTE) The Xpert Xpress SARS-CoV-2/FLU/RSV plus assay is intended as an aid in the diagnosis of influenza from Nasopharyngeal swab specimens and should not be used as a sole basis for treatment. Nasal washings and aspirates are unacceptable for Xpert Xpress SARS-CoV-2/FLU/RSV testing.  Fact Sheet for Patients: BloggerCourse.com  Fact Sheet for Healthcare Providers: SeriousBroker.it  This test is not yet approved or cleared by the [place] FDA and has been authorized for detection and/or diagnosis of SARS-CoV-2 by FDA under an Emergency Use Authorization (EUA). This EUA will remain in effect (meaning this test can be used) for the duration of the COVID-19 declaration under Section 564(b)(1) of the Act, 21 U.S.C. section 360bbb-3(b)(1), unless the authorization is terminated or revoked.     Resp Syncytial Virus by PCR NEGATIVE NEGATIVE Final     Comment: (NOTE) Fact Sheet for Patients: BloggerCourse.com  Fact Sheet for Healthcare Providers: SeriousBroker.it  This test is not yet approved or cleared by the [place] FDA and has been authorized for detection and/or diagnosis of SARS-CoV-2 by FDA under an Emergency Use Authorization (EUA). This EUA will remain in effect (meaning this test can be used) for the duration of the COVID-19 declaration under Section 564(b)(1) of the Act, 21 U.S.C. section 360bbb-3(b)(1), unless the authorization is terminated or revoked.  Performed at [facility], [address] [place], [place] [address]   Blood culture (routine x 2)     Status: None (Preliminary result)   Collection Time: 02/10/22  3:13 PM   Specimen: BLOOD  Result Value Ref Range Status   Specimen Description BLOOD BLOOD LEFT ARM  Final   Special Requests   Final    BOTTLES DRAWN AEROBIC AND ANAEROBIC Blood Culture adequate  volume   Culture   Final    NO GROWTH < 24 HOURS Performed at [facility], [address] [place], [place] [address]    Report Status PENDING  Incomplete  Blood culture (routine x 2)     Status: None (Preliminary result)   Collection Time: 02/10/22  3:17 PM   Specimen: BLOOD  Result Value Ref Range Status   Specimen Description BLOOD BLOOD RIGHT HAND  Final   Special Requests   Final    BOTTLES DRAWN AEROBIC AND ANAEROBIC Blood Culture adequate volume   Culture   Final    NO GROWTH < 24 HOURS Performed at [facility], [address], [place], [place] [address]    Report Status PENDING  Incomplete  MRSA Next Gen by PCR, Nasal     Status: Abnormal   Collection Time: 02/11/22  1:45 AM   Specimen: Nasal Mucosa; Nasal Swab  Result Value Ref Range Status   MRSA by PCR Next Gen DETECTED (A) NOT DETECTED Final    Comment: RESULT CALLED TO, READ BACK BY AND VERIFIED WITH: NALLELY SOSA 02/11/22 0503 MW (NOTE) The  GeneXpert MRSA Assay (FDA approved for NASAL specimens only), is one component of a comprehensive MRSA colonization surveillance program. It is not intended to diagnose MRSA infection nor to guide or monitor treatment for MRSA infections. Test performance is not FDA approved in patients less than 25 years old. Performed at [facility], [address] [place], [place] [address]     Medications: Scheduled Meds:  aspirin EC  81 mg Oral QODAY   Chlorhexidine Gluconate Cloth  6 each Topical Daily   clopidogrel  75 mg Oral Daily   levothyroxine  50 mcg Oral Q0600   pantoprazole  40 mg Oral Daily   sodium chloride flush  3 mL Intravenous Q12H   vancomycin variable dose per unstable renal function (pharmacist dosing)   Does not apply See admin instructions   Continuous Infusions:  ceFEPime (MAXIPIME) IV Stopped (02/10/22 2144)   heparin 800 Units/hr (02/11/22 0320)   metronidazole Stopped (02/11/22 0016)   sodium bicarbonate 150 mEq in dextrose 5 % 1,150 mL infusion 75 mL/hr at 02/11/22 0315   PRN Meds:.acetaminophen **OR** acetaminophen, ondansetron **OR** ondansetron (ZOFRAN) IV, mouth rinse, polyethylene glycol  Allergies  Allergen Reactions   Nickel Other (See Comments)    BREAKING OUT IN MOUTH IRRITATION RINGING IN THE EARS   Atorvastatin Other (See Comments)    Muscle Pain   Ezetimibe-Simvastatin Other (See Comments)    MYALGIAS   Pravastatin Other (See Comments)    MYALGIAS   Simvastatin Other (See Comments)    MYALGIAS   Amoxicillin Other (See Comments)    UNSPECIFIED REACTION    Codeine Other (See Comments)    Chest pain   Morphine And Related Nausea And Vomiting   Oxycodone Nausea And Vomiting   Shellfish Allergy Nausea And Vomiting   Sulfa Antibiotics Nausea Only and Other (See Comments)    Upset stomach with burning     Urinalysis: Recent Labs    02/10/22 1825  COLORURINE YELLOW*  LABSPEC 1.017  PHURINE 5.0  GLUCOSEU 50*  HGBUR MODERATE*   BILIRUBINUR NEGATIVE  KETONESUR NEGATIVE  PROTEINUR >=300*  NITRITE NEGATIVE  LEUKOCYTESUR LARGE*      Imaging: CT ABDOMEN PELVIS WO CONTRAST  Result Date: 02/10/2022 CLINICAL DATA:  Sepsis. EXAM: CT ABDOMEN AND PELVIS WITHOUT CONTRAST TECHNIQUE: Multidetector CT imaging of the abdomen and pelvis was performed following the  standard protocol without IV contrast. RADIATION DOSE REDUCTION: This exam was performed according to the departmental dose-optimization program which includes automated exposure control, adjustment of the mA and/or kV according to patient size and/or use of iterative reconstruction technique. COMPARISON:  None Available. FINDINGS: Lower chest: There is atelectasis in the lung bases. Hepatobiliary: Gallstones are present. There is no biliary ductal dilatation. The liver is within normal limits. Pancreas: Unremarkable. No pancreatic ductal dilatation or surrounding inflammatory changes. Spleen: Small in size.  No focal abnormality. Adrenals/Urinary Tract: Is a 2.3 cm hypodense area in the superior pole the left kidney measuring 35 Hounsfield units. There is some cortical scarring throughout the right kidney. There is a nonobstructing calculus measuring 3 mm in the superior pole the left kidney. There is no hydronephrosis or perinephric fat stranding. The adrenal glands and bladder are within normal limits. Stomach/Bowel: There is likely mild diffuse colonic wall thickening. There is no surrounding inflammation. There is colonic diverticulosis without evidence for acute diverticulitis. Small hiatal hernia is present. Stomach and small bowel are within normal limits. The appendix is within normal limits. Vascular/Lymphatic: Aortic atherosclerosis. No enlarged abdominal or pelvic lymph nodes. Reproductive: Status post hysterectomy. No adnexal masses. Other: No ascites.  Small fat containing umbilical hernia. Musculoskeletal: No acute or significant osseous findings. IMPRESSION: 1. Mild  diffuse colonic wall thickening worrisome for mild colitis. 2. Colonic diverticulosis without evidence for acute diverticulitis. 3. Cholelithiasis. 4. Nonobstructing left renal calculus. 5. 2.3 cm hypodense area in the superior pole the left kidney, indeterminate. Recommend further evaluation with ultrasound or MRI. Aortic Atherosclerosis (ICD10-I70.0). Electronically Signed   By: Darliss Cheney M.D.   On: 02/10/2022 17:12   DG Chest 2 View  Result Date: 02/10/2022 CLINICAL DATA:  Hypoxia. EXAM: CHEST - 2 VIEW COMPARISON:  Jul 19, 2018. FINDINGS: The heart size and mediastinal contours are within normal limits. Both lungs are clear. The visualized skeletal structures are unremarkable. IMPRESSION: No active cardiopulmonary disease. Electronically Signed   By: Lupita Raider M.D.   On: 02/10/2022 14:51   CT Head Wo Contrast  Result Date: 02/10/2022 CLINICAL DATA:  Altered mental status, hypoglycemia EXAM: CT HEAD WITHOUT CONTRAST TECHNIQUE: Contiguous axial images were obtained from the base of the skull through the vertex without intravenous contrast. RADIATION DOSE REDUCTION: This exam was performed according to the departmental dose-optimization program which includes automated exposure control, adjustment of the mA and/or kV according to patient size and/or use of iterative reconstruction technique. COMPARISON:  02/05/2022 FINDINGS: Brain: No acute intracranial findings are seen in noncontrast CT brain. There are no signs of bleeding within the cranium. Cortical sulci are prominent. There is decreased density in periventricular white matter. Possible small old lacunar infarct is seen in right thalamus. There is no focal edema or mass effect. Vascular: Scattered arterial calcifications are seen. Skull: Unremarkable. Sinuses/Orbits: Unremarkable Other: No significant interval changes are noted. IMPRESSION: No acute intracranial findings are seen in noncontrast CT brain. Atrophy. Small-vessel disease.  Electronically Signed   By: Ernie Avena M.D.   On: 02/10/2022 14:43      Assessment/Plan:  Paula Hansen is a 87 y.o. female with medical problems of  diabetes, hypertension, CKD, TIA, hyper thyroidism  was admitted on 02/10/2022 for :  Hyperkalemia [E87.5] Hypoglycemia [E16.2] Sepsis (HCC) [A41.9] Acute renal failure, unspecified acute renal failure type (HCC) [N17.9]  #Acute kidney injury likely secondary to severe ATN from dehydration, hypotension and sepsis Patient is oliguric. Primary team had goals of care discussion  with patient's son who is her power of attorney.  They did not wish to pursue aggressive measures such as dialysis.  Conservative management for now, with IV fluids.  #Hyperkalemia Improved some but shifting measures and improvement of acidosis with bicarbonate administration.  Conservative management for now.  Can use Lokelma if patient is able to swallow okay.    Paula Hansen 02/11/22

## 2022-02-11 NOTE — Progress Notes (Signed)
NAME:  Kristan Brummitt Mcgeehan, MRN:  [id number], DOB:  [birth year], LOS: 1 ADMISSION DATE:  02/10/2022,  CHIEF COMPLAINT:  renal failure   History of Present Illness:   87 y.o female with significant PMH of GERD, HTN, HLD, OA, Hypothyroidism, tSAH, CVA, T2DM, Neuropathy, CKD and Subclavian steal syndrome, who presented to the ED with chief complaints of AMS.   Per EMS run sheet and ED reports, EMS was called for AMS and on arrival patient was found laying in bed and responsive to verbal stimuli. Initial vs blood glucose of 45. EMS administered 100 ml of D5W  with improvement in responsiveness. Per patient's home health aide, patient was recently admitted for CVA ON Friday and since discharge she has been slow to wake up and has been too weak to tolerate po intake. Patient received additional 150 ml of D1O for BG of 62 and was transported to the ED.   ED Course: Initial vital signs showed HR of 77 beats/minute, BP 122/41 mm Hg, the RR 18 breaths/minute, and the oxygen saturation 90 % on RA and a temperature of 94.80F (34.6C).    Pertinent Labs/Diagnostics Findings: Chemistry:Na+/ K+:138/7.4  Glucose: 121 BUN/Cr.107/8.43 :Calcium: 8.3  AST/ALT:, CO2:9, Anion Gap: 28 CBC: WBC: 17.6 Hgb/Hct: 10.4/34.2  Other Lab findings:   PCT: pending Lactic acid:6.5  COVID PCR: Negative, Troponin: 533 Venous Blood Gas result:  pO2 34; pCO2 37; pH 7.16;  HCO3 13.2, %O2 Sat 38.2.  UA: +UTI Imaging: As detailed below   Patient given 30 cc/kg of fluids and started on broad-spectrum antibiotics Vanco cefepime and Flagyl for severe sepsis due to suspected UTI. Patient admitted to hospitalist service for further management of sepsis, severe AKI with AGMA. Due to severe AKI with hyperkalemia with concerns for possible requiring CRRT, patient received bicarb and was started on sodium bicarb gtt per Nephrology's recommendations. PCCM was consulted due to high risk for decompensation.  Pertinent  Medical History  GERD, HTN, HLD,  OA, Hypothyroidism, tSAH, CVA, T2DM, Neuropathy, CKD and Subclavian steal syndrome    Significant Hospital Events: Including procedures, antibiotic start and stop dates in addition to other pertinent events   12/20: Admit to stepdown with severe sepsis secondary to UTI. PCCM consulted for severe AKI with AGMA and Lactic acidosis.  Interim History / Subjective:  Patient disoriented this morning. Not complaining of pain or discomfort.  Objective   Blood pressure (!) 133/53, pulse 87, temperature (!) 97.3 F (36.3 C), resp. rate 18, height 5\' 3"  (1.6 m), weight 70.3 kg, SpO2 91 %.        Intake/Output Summary (Last 24 hours) at 02/11/2022 0849 Last data filed at 02/11/2022 0320 Gross per 24 hour  Intake 4546.29 ml  Output 0 ml  Net 4546.29 ml   Filed Weights   02/10/22 1352  Weight: 70.3 kg    Examination: Physical Exam Constitutional:      General: She is not in acute distress.    Appearance: She is ill-appearing.  HENT:     Mouth/Throat:     Mouth: Mucous membranes are moist.  Cardiovascular:     Rate and Rhythm: Regular rhythm. Tachycardia present.     Heart sounds: Normal heart sounds.  Pulmonary:     Effort: Respiratory distress present.     Breath sounds: Rales present.  Abdominal:     Palpations: Abdomen is soft.  Neurological:     Mental Status: She is alert. She is disoriented.     Assessment & Plan:  87 year old female with history of recent stroke presents to the hospital with altered mental status, hypotension, and renal failure.  Neurology #Toxic Metabolic Encephalopathy  In the setting of uremia, sepsis, recent stroke, and history of dementia. Awake and alert but disoriented. Son is Management consultantdecision maker and transitioned to comfort measures. Will initiate comfort measures order set.  Pulmonary #Acute Hypoxic Respiratory Failure  In the setting of renal failure, volume resuscitation and likely pulmonary edema. On high flow nasal cannula. Likely  pulmonary edema with the volume resuscitation and renal failure. Code status discussed with Son, she is DNI.  -continue nasal cannula for comfort  Cardiology #NSTEMI #Shock  Likely septic shock from a UTI source. Also with elevated troponin that is rising, no dynamic changes on EKG. Patient is DNR per son's request. He also would like to re-orient goals of care to comfort measures only. No escalation of care, no pressors.  GI  PO intake to comfort  Renal #Acute Renal Failure  Renal failure with sequale - uremia, hyperkalemia, acidemia. Was on bicarbonate infusion, will hold. Renal replacement therapy not in line with goals of care per her son. Highly appreciate nephrology team consult.  Endo PO intake to comfort, stop glucose checks  ID On antibiotics, will discontinue and treat for comfort.  Best Practice (right click and "Reselect all SmartList Selections" daily)   Diet/type: Regular consistency (see orders) DVT prophylaxis: not indicated GI prophylaxis: N/A Lines: N/A Foley:  N/A Code Status:  DNR, comfort measures. Last date of multidisciplinary goals of care discussion [02/11/2022]  Labs   CBC: Recent Labs  Lab 02/05/22 2119 02/10/22 1515 02/11/22 0500  WBC 7.4 17.6* 16.9*  NEUTROABS 5.4  --  11.9*  HGB 11.7* 10.4* 8.1*  HCT 36.9 34.2* 25.5*  MCV 100.0 104.6* 100.0  PLT 292 289 269    Basic Metabolic Panel: Recent Labs  Lab 02/05/22 2119 02/10/22 1515 02/10/22 1743 02/10/22 2322  NA 139 138 141 142  K 4.2 7.4* 7.2* 5.9*  CL 107 101 108 109  CO2 23 9* 12* 15*  GLUCOSE 97 121* 59* 73  BUN 38* 107* 93* 100*  CREATININE 1.17* 8.43* 7.77* 7.56*  CALCIUM 9.4 8.3* 7.8* 7.6*   GFR: Estimated Creatinine Clearance: 5.1 mL/min (A) (by C-G formula based on SCr of 7.56 mg/dL (H)). Recent Labs  Lab 02/05/22 2119 02/10/22 1513 02/10/22 1515 02/10/22 1743 02/10/22 1825 02/10/22 2322 02/11/22 0216 02/11/22 0500 02/11/22 0755  PROCALCITON  --   --    --  0.27  --   --   --   --   --   WBC 7.4  --  17.6*  --   --   --   --  16.9*  --   LATICACIDVEN  --    < >  --   --  6.1* 4.6* 4.3*  --  2.3*   < > = values in this interval not displayed.    Liver Function Tests: Recent Labs  Lab 02/05/22 2119  AST 21  ALT 14  ALKPHOS 63  BILITOT 0.5  PROT 7.5  ALBUMIN 4.0   No results for input(s): "LIPASE", "AMYLASE" in the last 168 hours. No results for input(s): "AMMONIA" in the last 168 hours.  ABG    Component Value Date/Time   PHART 7.25 (L) 02/11/2022 0048   PCO2ART 33 02/11/2022 0048   PO2ART 71 (L) 02/11/2022 0048   HCO3 14.5 (L) 02/11/2022 0048   ACIDBASEDEF 11.6 (H) 02/11/2022 16100048  O2SAT 92.8 02/11/2022 0048     Coagulation Profile: Recent Labs  Lab 02/05/22 2119 02/11/22 0500  INR 0.9 1.1    Cardiac Enzymes: Recent Labs  Lab 02/06/22 0437 02/10/22 1743  CKTOTAL 81 501*    HbA1C: Hgb A1c MFr Bld  Date/Time Value Ref Range Status  02/06/2022 04:37 AM 7.2 (H) 4.8 - 5.6 % Final    Comment:    (NOTE)         Prediabetes: 5.7 - 6.4         Diabetes: >6.4         Glycemic control for adults with diabetes: <7.0     CBG: Recent Labs  Lab 02/06/22 1931 02/10/22 1348 02/11/22 0133 02/11/22 0203 02/11/22 0616  GLUCAP 120* 128* 52* 149* 104*    Review of Systems:   Unable to obtain  Past Medical History:  She,  has a past medical history of Arthritis, Diabetes mellitus, Diabetic neuropathy (HCC), GERD (gastroesophageal reflux disease), Hypercholesteremia, Hypertension, Hypothyroidism, Lumbar stenosis, PONV (postoperative nausea and vomiting), Wears glasses, and Wears partial dentures.   Surgical History:   Past Surgical History:  Procedure Laterality Date   ABDOMINAL HYSTERECTOMY  1978   COLONOSCOPY     COLONOSCOPY W/ POLYPECTOMY     DILATION AND CURETTAGE OF UTERUS     JOINT REPLACEMENT     left knee October 2012.   KNEE ARTHROSCOPY W/ MENISCAL REPAIR     left knee   LUMBAR  LAMINECTOMY/DECOMPRESSION MICRODISCECTOMY  08/17/2011   Procedure: LUMBAR LAMINECTOMY/DECOMPRESSION MICRODISCECTOMY 1 LEVEL;  Surgeon: Clydene Fake, MD;  Location: [facility];  Service: Neurosurgery;  Laterality: Right;  Right Lumbar Two-Three Laminectomy/Diskectomy   LUMBAR LAMINECTOMY/DECOMPRESSION MICRODISCECTOMY Left 10/22/2015   Procedure: Laminectomy and Foraminotomy - Lumbar three-lumbar four, left;  Surgeon: Tia Alert, MD;  Location: [facility];  Service: Neurosurgery;  Laterality: Left;   TOOTH EXTRACTION       Social History:   reports that she has quit smoking. Her smoking use included cigarettes. She has never used smokeless tobacco. She reports that she does not drink alcohol and does not use drugs.   Family History:  Her family history includes Breast cancer in her cousin and maternal aunt; Cancer - Other in her brother; Stroke in her father and mother.   Allergies Allergies  Allergen Reactions   Nickel Other (See Comments)    BREAKING OUT IN MOUTH IRRITATION RINGING IN THE EARS   Atorvastatin Other (See Comments)    Muscle Pain   Ezetimibe-Simvastatin Other (See Comments)    MYALGIAS   Pravastatin Other (See Comments)    MYALGIAS   Simvastatin Other (See Comments)    MYALGIAS   Amoxicillin Other (See Comments)    UNSPECIFIED REACTION    Codeine Other (See Comments)    Chest pain   Morphine And Related Nausea And Vomiting   Oxycodone Nausea And Vomiting   Shellfish Allergy Nausea And Vomiting   Sulfa Antibiotics Nausea Only and Other (See Comments)    Upset stomach with burning      Home Medications  Prior to Admission medications   Medication Sig Start Date End Date Taking? Authorizing Provider  amLODipine-benazepril (LOTREL) 10-20 MG per capsule Take 1 capsule by mouth daily.   Yes [provider]  aspirin EC 81 MG tablet Take 81 mg by mouth every other day.    Yes [provider]  Biotin 2.5 MG TABS Take 2,500 mcg by mouth  daily.  Yes [provider]  Calcium Carbonate-Vit D-Min (CALTRATE PLUS PO) Take 1 tablet by mouth daily.   Yes [provider]  cetirizine (ZYRTEC) 10 MG tablet Take 10 mg by mouth daily.   Yes [provider]  Cholecalciferol (VITAMIN D-1000 MAX ST) 1000 units tablet Take 1,000 Units by mouth daily.    Yes [provider]  clopidogrel (PLAVIX) 75 MG tablet Take by mouth. 06/15/21 06/15/22 Yes [provider]  Coenzyme Q10 10 MG capsule Take 10 mg by mouth daily.   Yes [provider]  esomeprazole (NEXIUM) 40 MG capsule Take 40 mg by mouth daily before breakfast.   Yes [provider]  Flaxseed, Linseed, (FLAXSEED OIL) 1000 MG CAPS Take 1,000 mg by mouth daily.   Yes [provider]  gabapentin (NEURONTIN) 300 MG capsule Take 300 mg by mouth 3 (three) times daily.   Yes [provider]  glipiZIDE (GLUCOTROL XL) 10 MG 24 hr tablet Take 10 mg by mouth 2 (two) times daily.  08/02/15  Yes [provider]  levothyroxine (SYNTHROID, LEVOTHROID) 50 MCG tablet Take 50 mcg by mouth daily before breakfast.  08/19/15  Yes [provider]  metFORMIN (GLUCOPHAGE) 500 MG tablet Take 1,000 mg by mouth 2 (two) times daily with a meal.    Yes [provider]  Multiple Vitamin (MULTIVITAMIN WITH MINERALS) TABS Take 1 tablet by mouth daily.   Yes [provider]  pioglitazone (ACTOS) 30 MG tablet Take 30 mg by mouth daily. 07/20/21  Yes [provider]  Probiotic Product (ALIGN PO) Take by mouth.   Yes [provider]  rosuvastatin (CRESTOR) 40 MG tablet Take 0.5 tablets (20 mg total) by mouth every other day for 6 days, THEN 1 tablet (40 mg total) every other day. 02/08/22 03/16/22 Yes Sunnie Nielsen, DO  vitamin E 400 UNIT capsule Take 400 Units by mouth daily.   Yes [provider]  naproxen sodium (ANAPROX) 220 MG tablet Take 220 mg by mouth 2 (two) times daily as needed  (for pain).     [provider]     Critical care time: 85 minutes    [name], MD [facility] 02/11/2022 8:55 AM

## 2022-02-11 NOTE — IPAL (Signed)
  Interdisciplinary Goals of Care Family Meeting   Date carried out: 02/11/2022  Location of the meeting: Phone conference  Member's involved: Physician and Family Member or next of kin  Durable Power of Attorney or acting medical decision maker: Son, Paula Hansen    Discussion: We discussed goals of care for Paula Hansen. I called [name] and explained to him his mother's overall clinical condition. Her kidney function has deteriorated and she is going to require dialysis. Her blood pressure is low and she is currently in shock. This is also compounded by a NSTEMI. Her respiratory function is high risk for failure. Explained that escalation of care would entail intubation, mechanical ventilation, placement of dialysis catheter, placement of central line, arterial line, and initiation of renal replacement therapy. Explained that the chances of meaningful recovery is very slim to non-existent. He explained that they went through this with his father recently. He has discussed intubation and mechanical ventilation with them and it is not in her wishes. He also doesn't think she'd want renal replacement therapy. Overall [name] would want goals of care to be tailored to comfort. No escalation of care, no intubation, no pressors, and no dialysis.  Code status:   Code Status: Full Code   Disposition: In-patient comfort care  Time spent for the meeting: 20 minutes    [name], MD  02/11/2022, 8:27 AM

## 2022-02-11 NOTE — Progress Notes (Signed)
Hypoglycemic Event 0140 CBG: 52  Treatment: D50 50 mL (25 gm)  Symptoms: Shaking  Follow-up CBG: Time:0200 CBG Result:149  Possible Reasons for Event: Other: Metabolic Changes   Comments/MD notified:Elizabeth Ouma NP at bed side during hypoglycemic event.    Judyann Munson

## 2022-02-11 NOTE — Progress Notes (Signed)
eLink Physician-Brief Progress Note Patient Name: Paula Hansen DOB: 05-10-1934 MRN: [id number]   Date of Service  02/11/2022  HPI/Events of Note  89 F hx of DM, HTN, CKD, TIA, hypothyroidism, initially presented 12/20 with AMS with decreased PO intake with report of one episode of bowel and bladder incontinence. Managed as sepsis secondary to colitis. AKI with K 7.4. NSTEMI placed on heparin drip.  Fluid resuscitation ensued. She did develop dyspnea and hypoxemia with concern for developing pulmonary edema  eICU Interventions  Seen not in distress. Keep euvolemia. K down to 5.9. lactic acid 4.3. Continue antibiotics     Intervention Category Evaluation Type: New Patient Evaluation  Darl Pikes 02/11/2022, 3:53 AM

## 2022-02-11 NOTE — Progress Notes (Signed)
Pt increased WOB and destating into the 80s, had to increase Nasal canula flow to 5 L with no improvement. Now pt is on HFNC at 10 L, placed by respiratory therapist and NP made aware. Repeat lactic and troponin have been drawn, heparin gtt started at 800 units. Pt is drowsy but alert to voice and able to follow commands, bear hugger remains in place as well as bicarb gtt

## 2022-02-11 NOTE — TOC Initial Note (Signed)
Transition of Care [facility]) - Initial/Assessment Note    Patient Details  Name: Paula Hansen MRN: [id number] Date of Birth: 05-Oct-1934  [facility] CM/SW Contact:    Allayne Butcher, RN Phone Number: 02/11/2022, 2:45 PM  Clinical Narrative:                 Patient admitted with sepsis, came in from home.  Patient's son has decided on comfort care.  TOC will follow.   Planned Disposition: To be determined Barriers to Discharge: Continued Medical Work up   Patient Goals and CMS Choice Patient states their goals for this hospitalization and ongoing recovery are:: comfort care          Expected Discharge Plan and Services Planned Disposition: To be determined       Living arrangements for the past 2 months: Single Family Home                                      Prior Living Arrangements/Services Living arrangements for the past 2 months: Single Family Home Lives with:: Adult Children              Current home services: DME    Activities of Daily Living      Permission Sought/Granted                  Emotional Assessment              Admission diagnosis:  Hyperkalemia [E87.5] Hypoglycemia [E16.2] Sepsis (HCC) [A41.9] Acute renal failure, unspecified acute renal failure type [facility]) [N17.9] Patient Active Problem List   Diagnosis Date Noted   Sepsis (HCC) 02/10/2022   AKI (acute kidney injury) (HCC) 02/10/2022   Acute encephalopathy 02/10/2022   Hyperkalemia 02/10/2022   Demand ischemia 02/10/2022   Known medical problems 02/06/2022   TIA (transient ischemic attack) 02/05/2022   Carotid stenosis 07/29/2021   Subclavian artery stenosis (HCC) 07/29/2021   Essential hypertension 07/29/2021   Hyperlipidemia 07/29/2021   Diabetes (HCC) 07/29/2021   Closed Colles' fracture 07/06/2021   Swelling of joint of left wrist 06/15/2021   Diabetic polyneuropathy associated with type 2 diabetes mellitus (HCC) 05/28/2020    Nonproliferative diabetic retinopathy of both eyes (HCC) 05/11/2019   Chronic kidney disease, stage III (moderate) (HCC) 12/22/2017   S/P lumbar laminectomy 10/22/2015   Status post total left knee replacement 12/01/2014   Type II diabetes mellitus (HCC) 08/19/2014   Gastroesophageal reflux disease without esophagitis 02/06/2014   Mixed hyperlipidemia 02/06/2014   Hypothyroidism 08/13/2013   PCP:  Gracelyn Nurse, MD Pharmacy:   [facility] - [place], [place] - [address] [facility] AT Portal to Registered [facility] [address] [place] [address] Phone: [phone] Fax: [phone]  [id number] [place] [facility] - [address] [place] [address] Phone: [phone] Fax: [phone]     Social Determinants of Health (SDOH) Social History: SDOH Screenings   Food Insecurity: No Food Insecurity (02/06/2022)  Housing: Low Risk  (02/06/2022)  Transportation Needs: No Transportation Needs (02/06/2022)  Utilities: Not At Risk (02/06/2022)  Tobacco Use: Medium Risk (02/10/2022)   SDOH Interventions:     Readmission Risk Interventions     No data to display

## 2022-02-11 NOTE — Progress Notes (Signed)
ANTICOAGULATION CONSULT NOTE - Initial Consult  Pharmacy Consult for Heparin  Indication: chest pain/ACS  Allergies  Allergen Reactions   Nickel Other (See Comments)    BREAKING OUT IN MOUTH IRRITATION RINGING IN THE EARS   Atorvastatin Other (See Comments)    Muscle Pain   Ezetimibe-Simvastatin Other (See Comments)    MYALGIAS   Pravastatin Other (See Comments)    MYALGIAS   Simvastatin Other (See Comments)    MYALGIAS   Amoxicillin Other (See Comments)    UNSPECIFIED REACTION    Codeine Other (See Comments)    Chest pain   Morphine And Related Nausea And Vomiting   Oxycodone Nausea And Vomiting   Shellfish Allergy Nausea And Vomiting   Sulfa Antibiotics Nausea Only and Other (See Comments)    Upset stomach with burning     Patient Measurements: Height: 5\' 3"  (160 cm) Weight: 70.3 kg (155 lb) IBW/kg (Calculated) : 52.4 Heparin Dosing Weight: 66.9 kg   Vital Signs: Temp: 95.3 F (35.2 C) (12/21 0016) Temp Source: Rectal (12/21 0016) BP: 139/83 (12/21 0016) Pulse Rate: 88 (12/21 0016)  Labs: Recent Labs    02/10/22 1515 02/10/22 1743 02/10/22 2322  HGB 10.4*  --   --   HCT 34.2*  --   --   PLT 289  --   --   CREATININE 8.43* 7.77*  --   CKTOTAL  --  501*  --   TROPONINIHS 533* 1,036* 1,913*    Estimated Creatinine Clearance: 5 mL/min (A) (by C-G formula based on SCr of 7.77 mg/dL (H)).   Medical History: Past Medical History:  Diagnosis Date   Arthritis    Diabetes mellitus    Diabetic neuropathy (HCC)    GERD (gastroesophageal reflux disease)    Hypercholesteremia    Hypertension    Hypothyroidism    Lumbar stenosis    L3-L4 left   PONV (postoperative nausea and vomiting)    Wears glasses    Wears partial dentures    lower    Medications:  (Not in a hospital admission)   Assessment: Pharmacy consulted to dose heparin in this 87 year old female admitted with ACS/NSTEMI.   Pt was on heparin 5000 units SQ Q8H , last dose on 12/20 @  2230. CrCl = 5 ml/min   Goal of Therapy:  Heparin level 0.3-0.7 units/ml Monitor platelets by anticoagulation protocol: Yes   Plan:  Start heparin infusion at 800 units/hr Check anti-Xa level in 8 hours and daily while on heparin Continue to monitor H&H and platelets  Jazzlene Huot D 02/11/2022,12:33 AM

## 2022-02-11 NOTE — Consult Note (Signed)
Pharmacy Antibiotic Note  Paula Hansen is a 87 y.o. female admitted on 02/10/2022 with  sepsis of unknown origin . PMH significant for T2DM, CKD, hypothyroidism, GERD, neuropathy. Patient was recently discharged 02/07/22 after evaluation for an episode of word finding difficulty. No antibiotics given during that admission. CXR 12/20 unremarkable, CTAP concerning for colitis, diverticulosis. Pharmacy has been consulted for vancomycin and cefepime dosing.  Plan: Day 2 of antibiotics Received vancomycin 1500 mg IV x1 12/21 @ 0015 Patient in significant AKI, will dose by level given unstable renal function Re-time random vancomycin level for tomorrow with AM labs (12/22 @ 0500)  Continue cefepime 1g Q24H and adjust as renal function improves Patient is also on metronidazole 500 mg IV Q12H Continue to monitor renal function and follow culture results  Height: 5\' 3"  (160 cm) Weight: 70.3 kg (155 lb) IBW/kg (Calculated) : 52.4  Temp (24hrs), Avg:96.5 F (35.8 C), Min:94.3 F (34.6 C), Max:98.3 F (36.8 C)  Recent Labs  Lab 02/05/22 2119 02/10/22 1513 02/10/22 1515 02/10/22 1743 02/10/22 1825 02/10/22 2322 02/11/22 0216  WBC 7.4  --  17.6*  --   --   --   --   CREATININE 1.17*  --  8.43* 7.77*  --  7.56*  --   LATICACIDVEN  --  6.5*  --   --  6.1* 4.6* 4.3*     Estimated Creatinine Clearance: 5.1 mL/min (A) (by C-G formula based on SCr of 7.56 mg/dL (H)).    Allergies  Allergen Reactions   Nickel Other (See Comments)    BREAKING OUT IN MOUTH IRRITATION RINGING IN THE EARS   Atorvastatin Other (See Comments)    Muscle Pain   Ezetimibe-Simvastatin Other (See Comments)    MYALGIAS   Pravastatin Other (See Comments)    MYALGIAS   Simvastatin Other (See Comments)    MYALGIAS   Amoxicillin Other (See Comments)    UNSPECIFIED REACTION    Codeine Other (See Comments)    Chest pain   Morphine And Related Nausea And Vomiting   Oxycodone Nausea And Vomiting   Shellfish Allergy  Nausea And Vomiting   Sulfa Antibiotics Nausea Only and Other (See Comments)    Upset stomach with burning     Antimicrobials this admission: 12/20 Ceftriaxone x1 12/20 Cefepime >>  12/20 Vancomycin >>   Dose adjustments this admission:  Microbiology results: 12/20 BCx: NG<24H 12/20 UCx: IP  12/20 MRSA PCR: positive  Thank you for allowing pharmacy to be a part of this patient's care.  1/21, PharmD PGY1 Pharmacy Resident 02/11/2022 8:07 AM

## 2022-02-11 NOTE — Progress Notes (Signed)
Nutrition Brief Note  Chart reviewed. Case discussed with RN, MD, and during ICU rounds.  Pt now transitioning to comfort care.  No further nutrition interventions planned at this time.  Please re-consult as needed.   [place]cola A[place] W, RD, LDN, CDCES Registered Dietitian II Certified Diabetes Care and Education Specialist Please refer to AMION for RD and/or RD on-call/weekend/after hours pager   

## 2022-02-12 ENCOUNTER — Encounter: Payer: Self-pay | Admitting: Internal Medicine

## 2022-02-12 DIAGNOSIS — Z515 Encounter for palliative care: Secondary | ICD-10-CM | POA: Diagnosis not present

## 2022-02-12 DIAGNOSIS — N179 Acute kidney failure, unspecified: Secondary | ICD-10-CM | POA: Diagnosis not present

## 2022-02-12 DIAGNOSIS — E875 Hyperkalemia: Secondary | ICD-10-CM | POA: Diagnosis not present

## 2022-02-12 DIAGNOSIS — I2489 Other forms of acute ischemic heart disease: Secondary | ICD-10-CM | POA: Diagnosis not present

## 2022-02-12 DIAGNOSIS — Z7189 Other specified counseling: Secondary | ICD-10-CM | POA: Diagnosis not present

## 2022-02-12 DIAGNOSIS — R6521 Severe sepsis with septic shock: Secondary | ICD-10-CM

## 2022-02-12 DIAGNOSIS — E039 Hypothyroidism, unspecified: Secondary | ICD-10-CM

## 2022-02-12 DIAGNOSIS — R6520 Severe sepsis without septic shock: Secondary | ICD-10-CM | POA: Diagnosis not present

## 2022-02-12 DIAGNOSIS — E119 Type 2 diabetes mellitus without complications: Secondary | ICD-10-CM

## 2022-02-12 DIAGNOSIS — I10 Essential (primary) hypertension: Secondary | ICD-10-CM

## 2022-02-12 DIAGNOSIS — N390 Urinary tract infection, site not specified: Secondary | ICD-10-CM

## 2022-02-12 DIAGNOSIS — A419 Sepsis, unspecified organism: Secondary | ICD-10-CM | POA: Diagnosis not present

## 2022-02-12 LAB — COMPREHENSIVE METABOLIC PANEL
ALT: 15 U/L (ref 0–44)
AST: 50 U/L — ABNORMAL HIGH (ref 15–41)
Albumin: 2.8 g/dL — ABNORMAL LOW (ref 3.5–5.0)
Alkaline Phosphatase: 63 U/L (ref 38–126)
Anion gap: 6 (ref 5–15)
BUN: 12 mg/dL (ref 8–23)
CO2: 26 mmol/L (ref 22–32)
Calcium: 8.3 mg/dL — ABNORMAL LOW (ref 8.9–10.3)
Chloride: 109 mmol/L (ref 98–111)
Creatinine, Ser: 0.69 mg/dL (ref 0.44–1.00)
GFR, Estimated: >60 mL/min (ref >60)
Glucose, Bld: 125 mg/dL — ABNORMAL HIGH (ref 70–99)
Potassium: 4.1 mmol/L (ref 3.5–5.1)
Sodium: 141 mmol/L (ref 135–145)
Total Bilirubin: 1 mg/dL (ref 0.3–1.2)
Total Protein: 6.4 g/dL — ABNORMAL LOW (ref 6.5–8.1)

## 2022-02-12 LAB — CBC WITH DIFFERENTIAL/PLATELET
Abs Immature Granulocytes: 0.05 10*3/uL (ref 0.00–0.07)
Basophils Absolute: 0 10*3/uL (ref 0.0–0.1)
Basophils Relative: 0 %
Eosinophils Absolute: 0.2 10*3/uL (ref 0.0–0.5)
Eosinophils Relative: 1 %
HCT: 30.4 % — ABNORMAL LOW (ref 36.0–46.0)
Hemoglobin: 10.2 g/dL — ABNORMAL LOW (ref 12.0–15.0)
Immature Granulocytes: 0 %
Lymphocytes Relative: 10 %
Lymphs Abs: 1.1 10*3/uL (ref 0.7–4.0)
MCH: 31.7 pg (ref 26.0–34.0)
MCHC: 33.6 g/dL (ref 30.0–36.0)
MCV: 94.4 fL (ref 80.0–100.0)
Monocytes Absolute: 1.6 10*3/uL — ABNORMAL HIGH (ref 0.1–1.0)
Monocytes Relative: 14 %
Neutro Abs: 8.8 10*3/uL — ABNORMAL HIGH (ref 1.7–7.7)
Neutrophils Relative %: 75 %
Platelets: 251 10*3/uL (ref 150–400)
RBC: 3.22 MIL/uL — ABNORMAL LOW (ref 3.87–5.11)
RDW: 14.2 % (ref 11.5–15.5)
WBC: 11.8 10*3/uL — ABNORMAL HIGH (ref 4.0–10.5)
nRBC: 0 % (ref 0.0–0.2)

## 2022-02-12 LAB — MAGNESIUM: Magnesium: 2.1 mg/dL (ref 1.7–2.4)

## 2022-02-12 MED ORDER — ROSUVASTATIN CALCIUM 10 MG PO TABS
10.0000 mg | ORAL_TABLET | Freq: Every day | ORAL | Status: DC
  Administered 2022-02-13 – 2022-02-16 (×4): 10 mg via ORAL
  Filled 2022-02-12 (×4): qty 1

## 2022-02-12 MED ORDER — SODIUM CHLORIDE 0.9 % IV SOLN: INTRAVENOUS | Status: DC

## 2022-02-12 MED ORDER — SODIUM CHLORIDE 0.9 % IV BOLUS
500.0000 mL | Freq: Once | INTRAVENOUS | Status: AC
  Administered 2022-02-12: 500 mL via INTRAVENOUS

## 2022-02-12 MED ORDER — OSELTAMIVIR PHOSPHATE 30 MG PO CAPS
30.0000 mg | ORAL_CAPSULE | Freq: Two times a day (BID) | ORAL | Status: DC
  Filled 2022-02-12: qty 1

## 2022-02-12 MED ORDER — SODIUM CHLORIDE 0.9 % IV SOLN
2.0000 g | INTRAVENOUS | Status: DC
  Administered 2022-02-12 – 2022-02-13 (×2): 2 g via INTRAVENOUS
  Filled 2022-02-12: qty 20
  Filled 2022-02-12 (×2): qty 2

## 2022-02-12 MED ORDER — LORATADINE 10 MG PO TABS
10.0000 mg | ORAL_TABLET | Freq: Every day | ORAL | Status: DC
  Administered 2022-02-12 – 2022-02-16 (×5): 10 mg via ORAL
  Filled 2022-02-12 (×5): qty 1

## 2022-02-12 MED ORDER — OSELTAMIVIR PHOSPHATE 75 MG PO CAPS
75.0000 mg | ORAL_CAPSULE | Freq: Once | ORAL | Status: AC
  Administered 2022-02-12: 75 mg via ORAL
  Filled 2022-02-12: qty 1

## 2022-02-12 MED ORDER — ENOXAPARIN SODIUM 40 MG/0.4ML IJ SOSY
40.0000 mg | PREFILLED_SYRINGE | INTRAMUSCULAR | Status: DC
  Administered 2022-02-12: 40 mg via SUBCUTANEOUS
  Filled 2022-02-12: qty 0.4

## 2022-02-12 NOTE — Progress Notes (Signed)
PROGRESS NOTE    Paula Hansen  BMW:[id number] DOB: 27-Feb-1934 DOA: 02/10/2022 PCP: Gracelyn Nurse, MD    Chief Complaint  Patient presents with   Hypoglycemia    Brief Narrative:  Patient 87 year old female history of GERD, hypertension hyperlipidemia OA hypothyroidism CVA, DM2, neuropathy, CKD, subclavian steal syndrome presented to the ED on admission with altered mental status.  Patient on arrival noted to be in altered mental status, noted to have blood glucose in the 40s received D5W with some improvement in responsiveness.  Course in the ED and workup concerning for acute renal failure creatinine of 8.43, hyperkalemic, noted to be septic felt secondary to UTI, COVID-19 PCR negative, noted to have elevated troponins concerning for non-STEMI versus demand ischemia.  Patient initially placed on broad-spectrum IV antibiotics, pancultured, placed on IV fluids, bicarb drip and due to high risk of decompensation PCCM consulted.  Patient subsequently admitted to the ICU, noted to have some concerns of acute hypoxic respiratory failure felt initially secondary to pulmonary edema placed on high flow nasal cannula, nephrology consulted.  PCCM discussed with family and initial decision with goals of care was to transition to full comfort measures.  Patient subsequently transferred to the floor and transferred to the hospitalist team.  Patient seem to improved clinically, more alert and assessed by palliative care.  Family decided to treat reversible illnesses at this time with further evaluation by palliative care and PT OT.   Assessment & Plan:   Principal Problem:   Sepsis (HCC) Active Problems:   Hyperkalemia   AKI (acute kidney injury) (HCC)   Demand ischemia   Acute encephalopathy   Type II diabetes mellitus (HCC)   Hypothyroidism   Essential hypertension   Sepsis secondary to UTI (HCC)   Acute renal failure (HCC)  #1 septic shock secondary to UTI, POA -Patient admitted with altered  mental status, noted to be hypothermic on admission with leukocytosis, elevated lactic acid level, urinalysis consistent with UTI. -CT abdomen and pelvis done on admission concerning for mild colitis however no noted GI symptoms on presentation. -Chest x-ray done initially reassuring. -Patient noted to decline, with septic shock, noted to be with multiorgan failure with acute renal failure, hyperkalemia, lactic acidosis, concern for respiratory distress/hypoxia, critical care medicine discussed with family initially patient was made comfort. -Patient has improved clinically, alert, following commands, improvement with blood pressure. -Urine cultures with > 100,000 colonies of Klebsiella pneumonia.  Blood cultures with no growth to date. -COVID-19 PCR negative, influenza A and B PCR negative. -Patient more alert today, somewhat clinically improved. -Case discussed again with family who initially felt in discussion with critical care initially on presentation that patient would have deteriorated quickly however with clinical improvement I in agreement for treatment of reversible causes. -Repeat comprehensive metabolic profile obtained, CBC with differential ordered. -Will place on IV fluids, treat empirically with IV Rocephin pending urine culture sensitivities. -Supportive care. -Palliative care consulted for goals of care and reassessment.  2.  Acute renal failure/hyperkalemia -Patient had presented with acute renal failure on admission with a creatinine as high as 8.43 on day of admission with severe hyperkalemia with potassium of 7.4. -Felt likely secondary to prerenal azotemia in the setting of septic shock and sepsis. -Patient seen by nephrology on admission, PCCM discussed with family who felt patient would not be interested in hemodialysis. -Due to patient's acute deterioration and presentation PCCM discussed with family and initially decision was made to transition to full comfort  measures. -Patient transferred to  floor, has improved clinically, repeat comprehensive metabolic profile today with potassium of 4.1, creatinine of 0.69. -Family at this time interested in treating the reversible things. -Placed on IV fluids, treat UTI with empiric IV antibiotics. -Monitor urine output. -Repeat labs in the AM. -Supportive care.  3.  Non-STEMI/demand ischemia -Patient noted to have a significantly elevated troponin of 533>>> 1036>> 1913>> 2518. -Patient initially followed by PCCM, due to patient's presentation and in discussions with family patient initially made comfort measures. -Patient improved clinically. -Patient denies any chest pain. -EKG done on admission with no significant ischemic changes noted. -Repeat 2D echo. -If 2D echo is abnormal we will consult with cardiology for further evaluation and management. -Resume home regimen aspirin, Plavix, statin. -Supportive care.  4.  Acute hypoxemic respiratory failure -Patient on admission noted to go into acute hypoxemic respiratory failure in the setting of renal failure, concern initially for pulmonary edema noted to be on high flow nasal cannula. -PCCM initially discussed case with family initially patient transition to full comfort measures. -Patient improved clinically, noted to have sats of 92 to 93% on room air. -Gentle hydration for the next 24 hours and monitor respiratory status.  5.  Acute metabolic encephalopathy -Likely secondary to sepsis and uremic encephalopathy. -Renal function seems to have improved with repeat labs today. -Patient currently afebrile, improved clinically. -Blood cultures pending with no growth to date. -Urine cultures with > 100,000 colonies of Klebsiella with sensitivities pending. -Place back on IV Rocephin. -Supportive care.  6.  Diabetes mellitus type 2 -Hemoglobin A1c 7.2 (02/06/2022) -Hold home regimen oral hypoglycemic agents. -Placed on SSI.  7.   Hypothyroidism -Resume home regimen Synthroid.  8.  Hypertension -Continue to hold home regimen antihypertensive medications.  9.  Recent TIA -Patient noted to have a TIA during recent hospitalization 02/07/2022. -Patient noted to have been discharged on Plavix and aspirin which we will resume for secondary stroke prophylaxis.. -Place back on statin  10.  GERD -PPI.  11.  Prognosis -Initially on admission patient was in multiorgan failure, noted to be deteriorating, seen by PCCM will discuss with family goals of care discussion was obtained and patient was transition to full comfort measures. -Patient transferred to the floor, patient however has improved clinically with improvement with renal function, electrolytes more alert and following commands appropriately. -Palliative care consulted to rediscuss goals of care. -Will likely need outpatient follow-up with palliative care on discharge.   DVT prophylaxis: Lovenox Code Status: DNR Family Communication: Updated son and grandson at bedside. Disposition: TBD  Status is: Inpatient Remains inpatient appropriate because: Severity of illness/unsafe disposition   Consultants:  PCCM: Dr.Dgayli 02/10/2022 Nephrology: Dr. Thedore Mins 02/11/2022 Palliative care: Lillia Carmel 02/12/2022  Procedures:  CT head 02/10/2022 CT abdomen and pelvis 02/10/2022 Chest x-ray 02/10/2022   Antimicrobials:  IV Rocephin 02/12/2022>>>   Subjective: Patient laying in bed easily arousable.  Alert and oriented to self and place.  Unsure of the year or the month.  Denies any chest pain.  Denies any shortness of breath.  No abdominal pain.  Following commands appropriately.  Son and grandson at bedside.  Objective: Vitals:   02/11/22 1604 02/11/22 1955 02/12/22 0803 02/12/22 1650  BP: 117/86 (!) 144/49 (!) 148/54 127/79  Pulse: 78 79 79 79  Resp: Temp: 98.7 F (37.1 C) 98 F (36.7 C) 97.8 F (36.6 C) (!) 97.5 F (36.4 C)  TempSrc:     Oral  SpO2: (!) 88% 94% 92% 93%  Weight:  Height:        Intake/Output Summary (Last 24 hours) at 02/12/2022 1902 Last data filed at 02/12/2022 0930 Gross per 24 hour  Intake 400 ml  Output 700 ml  Net -300 ml   Filed Weights   02/10/22 1352  Weight: 70.3 kg    Examination:  General exam: Alert.  Dry mucous membranes. Respiratory system: Clear to auscultation anterior lung fields.  No wheezes, no crackles, no rhonchi.  Fair air movement.  Speaking in full sentences. Cardiovascular system: S1 & S2 heard, RRR. No JVD, murmurs, rubs, gallops or clicks. No pedal edema. Gastrointestinal system: Abdomen is nondistended, soft and nontender. No organomegaly or masses felt. Normal bowel sounds heard. Central nervous system: Alert and oriented x 2. No focal neurological deficits.  Moving extremities spontaneously. Extremities: Symmetric 5 x 5 power. Skin: No rashes, lesions or ulcers Psychiatry: Judgement and insight appear normal. Mood & affect appropriate.     Data Reviewed: I have personally reviewed following labs and imaging studies  CBC: Recent Labs  Lab 02/05/22 2119 02/10/22 1515 02/11/22 0500 02/12/22 1337  WBC 7.4 17.6* 16.9* 11.8*  NEUTROABS 5.4  --  11.9* 8.8*  HGB 11.7* 10.4* 8.1* 10.2*  HCT 36.9 34.2* 25.5* 30.4*  MCV 100.0 104.6* 100.0 94.4  PLT 292 289 269 251    Basic Metabolic Panel: Recent Labs  Lab 02/10/22 1515 02/10/22 1743 02/10/22 2322 02/11/22 0753 02/11/22 0754 02/12/22 1313  NA 138 141 142  --  141 141  K 7.4* 7.2* 5.9*  --  5.8* 4.1  CL 101 108 109  --  106 109  CO2 9* 12* 15*  --  16* 26  GLUCOSE 121* 59* 73  --  92 125*  BUN 107* 93* 100*  --  100* 12  CREATININE 8.43* 7.77* 7.56*  --  7.54* 0.69  CALCIUM 8.3* 7.8* 7.6*  --  7.5* 8.3*  MG  --   --   --  1.7  --  2.1    GFR: Estimated Creatinine Clearance: 48.4 mL/min (by C-G formula based on SCr of 0.69 mg/dL).  Liver Function Tests: Recent Labs  Lab 02/05/22 2119  02/11/22 0754 02/12/22 1313  AST 21 42* 50*  ALT 14 21 15   ALKPHOS 63 49 63  BILITOT 0.5 0.7 1.0  PROT 7.5 5.7* 6.4*  ALBUMIN 4.0 3.0* 2.8*    CBG: Recent Labs  Lab 02/06/22 1931 02/10/22 1348 02/11/22 0133 02/11/22 0203 02/11/22 0616  GLUCAP 120* 128* 52* 149* 104*     Recent Results (from the past 240 hour(s))  Resp panel by RT-PCR (RSV, Flu A&B, Covid) Anterior Nasal Swab     Status: None   Collection Time: 02/10/22  3:13 PM   Specimen: Anterior Nasal Swab  Result Value Ref Range Status   SARS Coronavirus 2 by RT PCR NEGATIVE NEGATIVE Final    Comment: (NOTE) SARS-CoV-2 target nucleic acids are NOT DETECTED.  The SARS-CoV-2 RNA is generally detectable in upper respiratory specimens during the acute phase of infection. The lowest concentration of SARS-CoV-2 viral copies this assay can detect is 138 copies/mL. A negative result does not preclude SARS-Cov-2 infection and should not be used as the sole basis for treatment or other patient management decisions. A negative result may occur with  improper specimen collection/handling, submission of specimen other than nasopharyngeal swab, presence of viral mutation(s) within the areas targeted by this assay, and inadequate number of viral copies(<138 copies/mL). A negative result must be combined with  clinical observations, patient history, and epidemiological information. The expected result is Negative.  Fact Sheet for Patients:  BloggerCourse.com  Fact Sheet for Healthcare Providers:  SeriousBroker.it  This test is no t yet approved or cleared by the [place] FDA and  has been authorized for detection and/or diagnosis of SARS-CoV-2 by FDA under an Emergency Use Authorization (EUA). This EUA will remain  in effect (meaning this test can be used) for the duration of the COVID-19 declaration under Section 564(b)(1) of the Act, 21 U.S.C.section 360bbb-3(b)(1),  unless the authorization is terminated  or revoked sooner.       Influenza A by PCR NEGATIVE NEGATIVE Final   Influenza B by PCR NEGATIVE NEGATIVE Final    Comment: (NOTE) The Xpert Xpress SARS-CoV-2/FLU/RSV plus assay is intended as an aid in the diagnosis of influenza from Nasopharyngeal swab specimens and should not be used as a sole basis for treatment. Nasal washings and aspirates are unacceptable for Xpert Xpress SARS-CoV-2/FLU/RSV testing.  Fact Sheet for Patients: BloggerCourse.com  Fact Sheet for Healthcare Providers: SeriousBroker.it  This test is not yet approved or cleared by the [place] FDA and has been authorized for detection and/or diagnosis of SARS-CoV-2 by FDA under an Emergency Use Authorization (EUA). This EUA will remain in effect (meaning this test can be used) for the duration of the COVID-19 declaration under Section 564(b)(1) of the Act, 21 U.S.C. section 360bbb-3(b)(1), unless the authorization is terminated or revoked.     Resp Syncytial Virus by PCR NEGATIVE NEGATIVE Final    Comment: (NOTE) Fact Sheet for Patients: BloggerCourse.com  Fact Sheet for Healthcare Providers: SeriousBroker.it  This test is not yet approved or cleared by the [place] FDA and has been authorized for detection and/or diagnosis of SARS-CoV-2 by FDA under an Emergency Use Authorization (EUA). This EUA will remain in effect (meaning this test can be used) for the duration of the COVID-19 declaration under Section 564(b)(1) of the Act, 21 U.S.C. section 360bbb-3(b)(1), unless the authorization is terminated or revoked.  Performed at [facility], [address] [place], [place] [address]   Blood culture (routine x 2)     Status: None (Preliminary result)   Collection Time: 02/10/22  3:13 PM   Specimen: BLOOD  Result Value Ref Range Status    Specimen Description BLOOD BLOOD LEFT ARM  Final   Special Requests   Final    BOTTLES DRAWN AEROBIC AND ANAEROBIC Blood Culture adequate volume   Culture   Final    NO GROWTH 2 DAYS Performed at [facility], [address], [place], [place] [address]    Report Status PENDING  Incomplete  Blood culture (routine x 2)     Status: None (Preliminary result)   Collection Time: 02/10/22  3:17 PM   Specimen: BLOOD  Result Value Ref Range Status   Specimen Description BLOOD BLOOD RIGHT HAND  Final   Special Requests   Final    BOTTLES DRAWN AEROBIC AND ANAEROBIC Blood Culture adequate volume   Culture   Final    NO GROWTH 2 DAYS Performed at [facility], [address] [place], [place] [address]    Report Status PENDING  Incomplete  Urine Culture     Status: Abnormal (Preliminary result)   Collection Time: 02/10/22  6:25 PM   Specimen: Urine, Random  Result Value Ref Range Status   Specimen Description   Final    URINE, RANDOM Performed at [facility], [address] [place], [place] [address]    Special Requests   Final    NONE Performed at [facility], [address] [place], [place] [address]    Culture (A)  Final    >=100,000 COLONIES/mL KLEBSIELLA PNEUMONIAE SUSCEPTIBILITIES TO FOLLOW Performed at [facility], [address] [place], [place] [address]    Report Status PENDING  Incomplete  MRSA Next Gen by PCR, Nasal     Status: Abnormal   Collection Time: 02/11/22  1:45 AM   Specimen: Nasal Mucosa; Nasal Swab  Result Value Ref Range Status   MRSA by PCR Next Gen DETECTED (A) NOT DETECTED Final    Comment: RESULT CALLED TO, READ BACK BY AND VERIFIED WITH: NALLELY SOSA 02/11/22 0503 MW (NOTE) The GeneXpert MRSA Assay (FDA approved for NASAL specimens only), is one component of a comprehensive MRSA colonization surveillance program. It is not intended to diagnose MRSA infection nor to guide or monitor treatment for MRSA  infections. Test performance is not FDA approved in patients less than 85 years old. Performed at [facility], [address] [place], [place] [address]          Radiology Studies: No results found.      Scheduled Meds:  aspirin EC  81 mg Oral QODAY   Chlorhexidine Gluconate Cloth  6 each Topical Daily   clopidogrel  75 mg Oral Daily   enoxaparin (LOVENOX) injection  40 mg Subcutaneous Q24H   levothyroxine  50 mcg Oral Q0600   [START ON 02/13/2022] oseltamivir  30 mg Oral BID   oseltamivir  75 mg Oral Once   pantoprazole  40 mg Oral Daily   sodium chloride flush  3 mL Intravenous Q12H   Continuous Infusions:  sodium chloride 250 mL (02/12/22 1433)   sodium chloride     sodium chloride 100 mL/hr at 02/12/22 1828   cefTRIAXone (ROCEPHIN)  IV 2 g (02/12/22 1644)     LOS: 2 days    Time spent: 40 minutes    Ramiro Harvestaniel Esiquio Boesen, MD [facility]   To contact the attending provider between 7A-7P or the covering provider during after hours 7P-7A, please log into the web site [email or web address] and access using universal [place] password for that web site. If you do not have the password, please call the hospital operator.  02/12/2022, 7:02 PM

## 2022-02-12 NOTE — Care Management Important Message (Signed)
Important Message  Patient Details  Name: Ameliyah Sarno Trembley MRN: [id number] Date of Birth: 04-04-1934   Medicare Important Message Given:  Other (see comment)  Patient is on comfort care and out respect for the patient and family no Important Message from [facility] given.   Olegario Messier A Idell Hissong 02/12/2022, 8:06 AM

## 2022-02-12 NOTE — TOC Progression Note (Signed)
Transition of Care [facility]) - Progression Note    Patient Details  Name: Paula Hansen MRN: [id number] Date of Birth: 08/24/1934  Transition of Care [facility]) CM/SW Contact  Marlowe Sax, RN Phone Number: 02/12/2022, 1:06 PM  Clinical Narrative:     Called the patient's son Paula Hansen at his request, he stated that he just left and will be returning after having lunch and would like to speak to me then about what the next steps are    Barriers to Discharge: Continued Medical Work up  Expected Discharge Plan and Services       Living arrangements for the past 2 months: Single Family Home                                       Social Determinants of Health (SDOH) Interventions SDOH Screenings   Food Insecurity: No Food Insecurity (02/06/2022)  Housing: Low Risk  (02/06/2022)  Transportation Needs: No Transportation Needs (02/06/2022)  Utilities: Not At Risk (02/06/2022)  Tobacco Use: Medium Risk (02/10/2022)    Readmission Risk Interventions     No data to display

## 2022-02-12 NOTE — Progress Notes (Signed)
[facility] [address] [employer] [facility]) Hospital Liaison note:  This patient has been referred to [facility] Outpatient Palliative Care program.   Unsure if the patient meets the criteria for our outpatient program at this time. Called NP - no vm pickup, unable to leave messages.    Thank you, Abran Cantor, LPN [facility] Liaison [phone]

## 2022-02-12 NOTE — Consult Note (Signed)
Consultation Note Date: 02/12/2022   Patient Name: Paula Hansen  DOB: 1934-05-27  MRN: [id number]  Age / Sex: 87 y.o., female  PCP: Paula Hire, MD Referring Physician: Eugenie Filler, MD  Reason for Consultation: Establishing goals of care  HPI/Patient Profile: 87 y.o. female  with past medical history of recent TIA (when the last 2 weeks, DM 2 with neuropathy, CKD, HTN/HLD, lumbar stenosis, OA, hypothyroid, GERD, left knee replacement 2012, former smoker admitted on 02/10/2022 with sepsis with acute hypoxic/hypercapnic respiratory failure, elevated troponin likely demand, acute kidney failure improved.   Clinical Assessment and Goals of Care: I have reviewed medical records including EPIC notes, labs and imaging, received report from RN, assessed the patient.  Paula Hansen is lying quietly in bed.  She greets me, making and keeping eye contact.  She appears elderly and somewhat frail.  She is alert and oriented x 2, able to make her basic needs known.  Her husband of 23 years, Paula Hansen, is present at bedside along with home aide worker Paula Hansen from [facility] care services.    We discussed a brief life review of the patient.  Mrs. and Mr. Paula Hansen have been married for 64 years.  They have 1 child, son Paula Hansen.  Currently she is receiving home care from [facility] at home services.  At bedside, worker Paula Hansen shares that she has been with Paula Hansen for several months.  We talk about her acute health concerns and the treatment plan.  We talked about time for outcomes.  Call to son, Paula Hansen [facility]) Paula Hansen, no answer, left voicemail message.  I return to the room for face-to-face meeting with son Paula Hansen and his son.  We meet in the dictation room to discuss diagnosis prognosis, GOC, EOL wishes, disposition and options.  I introduced Palliative Medicine as specialized medical care for people living with serious  illness. It focuses on providing relief from the symptoms and stress of a serious illness. The goal is to improve quality of life for both the patient and the family.  We then focused on their current illness.  We talked about the almost miraculous turnaround that Mrs. Paula Hansen has had.  We talk about the treatment plan and time for outcomes.  Paula Hansen shares a story of decline over the last few months in particular.  He shares that Mr. Mrs. Paula Hansen have always wanted to remain independent.  He has home health services for several months now but feels that they are needing more round-the-clock care.    The natural disease trajectory and expectations at EOL were discussed.  We talk about the concept of "let nature take its course".  I give an example.  I share is not illegal or unethical to let nature take its course.  I shared that Paula Hansen is facing some hard choices soon.  He shares that his mother has stated for years that she would rather stay in her own home.  He currently has at home health care from [facility] at home from  11 AM to 8 PM.  He is looking for more help at home.    Advanced directives, concepts specific to code status, artifical feeding and hydration, and rehospitalization were considered and discussed.  No extraordinary measures/DNR endorsed with son.  He shares that Mrs. Paula Hansen elected this for her husband a few months ago when he had a stroke.  TOC team member Paula Hansen arrives to join conversation.  We continue to talk about options for at home care.  At this point Paula takes over the meeting.  Palliative Care services outpatient were explained and offered.  Paula Hansen is requesting outpatient palliative/hospice care services with [facility].  We talked about the benefits of at home hospice care.  We talked about what is and is not provided.   Discussed the importance of continued conversation with family and the medical providers regarding overall plan of care and treatment options, ensuring  decisions are within the context of the patient's values and GOCs.  Questions and concerns were addressed.  The family was encouraged to call with questions or concerns.  PMT will continue to support holistically.  Conference with attending, bedside nursing staff, transition of care team related to patient condition, needs, goals of care, disposition.   HCPOA NEXT OF KIN -Paula Hansen has healthcare power of attorney paperwork in her ACP tab in epic chart.  She names her husband Paula Hansen and her son Paula Hansen as her healthcare surrogates.  When asked today, she shares that it may be best if her son Paula Hansen makes healthcare choices when she is unable (due to her husband's advanced age).   SUMMARY OF RECOMMENDATIONS   At this point continue to treat the treatable but no CPR or intubation No extraordinary measures such as HD Time for outcomes Outpatient palliative/hospice care for both Mr. and Mrs. Paula Hansen if possible with [facility]   Code Status/Advance Care Planning: DNR  Symptom Management:  Per hospitalist, no additional needs at this time.  Palliative Prophylaxis:  Oral Care and Turn Reposition  Additional Recommendations (Limitations, Scope, Preferences): Continue to treat the treatable but no CPR or intubation.  Time for outcomes.  Psycho-social/Spiritual:  Desire for further Chaplaincy support:no Additional Recommendations: Caregiving  Support/Resources  Prognosis:  Unable to determine, based on outcomes.  Discharge Planning: To be determined, based on qualifications and patient/family desire      Primary Diagnoses: Present on Admission:  Sepsis ([place])  Hypothyroidism  Essential hypertension   I have reviewed the medical record, interviewed the patient and family, and examined the patient. The following aspects are pertinent.  Past Medical History:  Diagnosis Date   Arthritis    Diabetes mellitus    Diabetic neuropathy (HCC)    GERD (gastroesophageal reflux disease)     Hypercholesteremia    Hypertension    Hypothyroidism    Lumbar stenosis    L3-L4 left   PONV (postoperative nausea and vomiting)    Wears glasses    Wears partial dentures    lower   Social History   Socioeconomic History   Marital status: Married    Spouse name: Not on file   Number of children: Not on file   Years of education: Not on file   Highest education level: Not on file  Occupational History   Not on file  Tobacco Use   Smoking status: Former    Years: 30.00    Types: Cigarettes   Smokeless tobacco: Never   Tobacco comments:    quit  in 2000-2001  Substance and  Sexual Activity   Alcohol use: No   Drug use: No   Sexual activity: Not on file  Other Topics Concern   Not on file  Social History Narrative   Not on file   Social Determinants of Health   Financial Resource Strain: Not on file  Food Insecurity: No Food Insecurity (02/06/2022)   Hunger Vital Sign    Worried About Running Out of Food in the Last Year: Never true    Ran Out of Food in the Last Year: Never true  Transportation Needs: No Transportation Needs (02/06/2022)   PRAPARE - Hydrologist (Medical): No    Lack of Transportation (Non-Medical): No  Physical Activity: Not on file  Stress: Not on file  Social Connections: Not on file   Family History  Problem Relation Age of Onset   Breast cancer Maternal Aunt        37's   Stroke Mother    Stroke Father    Cancer - Other Brother    Breast cancer Cousin    Scheduled Meds:  aspirin EC  81 mg Oral QODAY   Chlorhexidine Gluconate Cloth  6 each Topical Daily   clopidogrel  75 mg Oral Daily   levothyroxine  50 mcg Oral Q0600   pantoprazole  40 mg Oral Daily   sodium chloride flush  3 mL Intravenous Q12H   Continuous Infusions:  sodium chloride Stopped (02/11/22 0833)   sodium chloride     sodium chloride     cefTRIAXone (ROCEPHIN)  IV     sodium chloride     PRN Meds:.acetaminophen **OR** acetaminophen,  glycopyrrolate **OR** glycopyrrolate **OR** glycopyrrolate, haloperidol lactate, HYDROmorphone (DILAUDID) injection, LORazepam, ondansetron **OR** ondansetron (ZOFRAN) IV, mouth rinse, polyethylene glycol, polyvinyl alcohol Medications Prior to Admission:  Prior to Admission medications   Medication Sig Start Date End Date Taking? Authorizing Provider  amLODipine-benazepril (LOTREL) 10-20 MG per capsule Take 1 capsule by mouth daily.   Yes [provider]  aspirin EC 81 MG tablet Take 81 mg by mouth every other day.    Yes [provider]  Biotin 2.5 MG TABS Take 2,500 mcg by mouth daily.    Yes [provider]  Calcium Carbonate-Vit D-Min (CALTRATE PLUS PO) Take 1 tablet by mouth daily.   Yes [provider]  cetirizine (ZYRTEC) 10 MG tablet Take 10 mg by mouth daily.   Yes [provider]  Cholecalciferol (VITAMIN D-1000 MAX ST) 1000 units tablet Take 1,000 Units by mouth daily.    Yes [provider]  clopidogrel (PLAVIX) 75 MG tablet Take by mouth. 06/15/21 06/15/22 Yes [provider]  Coenzyme Q10 10 MG capsule Take 10 mg by mouth daily.   Yes [provider]  esomeprazole (NEXIUM) 40 MG capsule Take 40 mg by mouth daily before breakfast.   Yes [provider]  Flaxseed, Linseed, (FLAXSEED OIL) 1000 MG CAPS Take 1,000 mg by mouth daily.   Yes [provider]  gabapentin (NEURONTIN) 300 MG capsule Take 300 mg by mouth 3 (three) times daily.   Yes [provider]  glipiZIDE (GLUCOTROL XL) 10 MG 24 hr tablet Take 10 mg by mouth 2 (two) times daily.  08/02/15  Yes [provider]  levothyroxine (SYNTHROID, LEVOTHROID) 50 MCG tablet Take 50 mcg by mouth daily before breakfast.  08/19/15  Yes [provider]  metFORMIN (GLUCOPHAGE) 500 MG tablet Take 1,000 mg by mouth 2 (two) times daily with a meal.  Yes [provider]  Multiple Vitamin (MULTIVITAMIN WITH MINERALS) TABS Take  1 tablet by mouth daily.   Yes [provider]  pioglitazone (ACTOS) 30 MG tablet Take 30 mg by mouth daily. 07/20/21  Yes [provider]  Probiotic Product (ALIGN PO) Take by mouth.   Yes [provider]  rosuvastatin (CRESTOR) 40 MG tablet Take 0.5 tablets (20 mg total) by mouth every other day for 6 days, THEN 1 tablet (40 mg total) every other day. 02/08/22 03/16/22 Yes Emeterio Reeve, DO  vitamin E 400 UNIT capsule Take 400 Units by mouth daily.   Yes [provider]  naproxen sodium (ANAPROX) 220 MG tablet Take 220 mg by mouth 2 (two) times daily as needed (for pain).     [provider]   Allergies  Allergen Reactions   Nickel Other (See Comments)    BREAKING OUT IN MOUTH IRRITATION RINGING IN THE EARS   Atorvastatin Other (See Comments)    Muscle Pain   Ezetimibe-Simvastatin Other (See Comments)    MYALGIAS   Pravastatin Other (See Comments)    MYALGIAS   Simvastatin Other (See Comments)    MYALGIAS   Amoxicillin Other (See Comments)    UNSPECIFIED REACTION    Codeine Other (See Comments)    Chest pain   Morphine And Related Nausea And Vomiting   Oxycodone Nausea And Vomiting   Shellfish Allergy Nausea And Vomiting   Sulfa Antibiotics Nausea Only and Other (See Comments)    Upset stomach with burning    Review of Systems  Unable to perform ROS: Age    Physical Exam Vitals reviewed.  Constitutional:      General: She is not in acute distress.    Appearance: Normal appearance. She is not ill-appearing.  HENT:     Head: Normocephalic and atraumatic.     Mouth/Throat:     Mouth: Mucous membranes are moist.  Cardiovascular:     Rate and Rhythm: Normal rate.  Pulmonary:     Effort: Pulmonary effort is normal. No respiratory distress.  Neurological:     Mental Status: She is alert.     Comments: Oriented to person and place only, not month.  Psychiatric:        Mood and Affect: Mood normal.        Behavior:  Behavior normal.     Vital Signs: BP (!) 148/54 (BP Location: Right Arm)   Pulse 79   Temp 97.8 F (36.6 C)   Resp 17   Ht 5\' 3"  (1.6 m)   Wt 70.3 kg   SpO2 92%   BMI 27.46 kg/m  Pain Scale: 0-10   Pain Score: 0-No pain   SpO2: SpO2: 92 % O2 Device:SpO2: 92 % O2 Flow Rate: .O2 Flow Rate (L/min): 2 L/min  IO: Intake/output summary:  Intake/Output Summary (Last 24 hours) at 02/12/2022 1418 Last data filed at 02/12/2022 0930 Gross per 24 hour  Intake 400 ml  Output 945 ml  Net -545 ml    LBM: Last BM Date : 02/11/22 Baseline Weight: Weight: 70.3 kg Most recent weight: Weight: 70.3 kg     Palliative Assessment/Data:   Flowsheet Rows    Flowsheet Row Most Recent Value  Intake Tab   Referral Department Hospitalist  Unit at Time of Referral Med/Surg Unit  Palliative Care Primary Diagnosis Sepsis/Infectious Disease  Date Notified 02/12/22  Palliative Care Type New Palliative care  Reason for referral Clarify Goals of Care  Date of Admission  02/10/22  Date first seen by Palliative Care 02/12/22  # of days Palliative referral response time 0 Day(s)  # of days IP prior to Palliative referral 2  Clinical Assessment   Palliative Performance Scale Score 30%  Pain Max last 24 hours Not able to report  Pain Min Last 24 hours Not able to report  Dyspnea Max Last 24 Hours Not able to report  Dyspnea Min Last 24 hours Not able to report  Psychosocial & Spiritual Assessment   Palliative Care Outcomes        Time In: 1320 Time Out: 1435 Time Total: 75 minutes  Greater than 50%  of this time was spent counseling and coordinating care related to the above assessment and plan.  Signed by: Katheran Awe, NP   Please contact Palliative Medicine Team phone at [phone] for questions and concerns.  For individual provider: See Loretha Stapler

## 2022-02-12 NOTE — TOC Progression Note (Signed)
Transition of Care [facility]) - Progression Note    Patient Details  Name: Paula Hansen MRN: [id number] Date of Birth: 1934/02/26  Transition of Care [facility]) CM/SW [place], RN Phone Number: 02/12/2022, 3:49 PM  Clinical Narrative:     Met with the patient's son Shanon Brow and grand son, explained hospice at home, they would like to use [facility] and I notified Shania at [employer], she will call the son and speak to him about Hospice at home and outpatient Palliative    Barriers to Discharge: Continued Medical Work up  Expected Discharge Plan and Services       Living arrangements for the past 2 months: Single Family Home                                       Social Determinants of Health (SDOH) Interventions SDOH Screenings   Food Insecurity: No Food Insecurity (02/06/2022)  Housing: Low Risk  (02/06/2022)  Transportation Needs: No Transportation Needs (02/06/2022)  Utilities: Not At Risk (02/06/2022)  Tobacco Use: Medium Risk (02/12/2022)    Readmission Risk Interventions     No data to display

## 2022-02-13 ENCOUNTER — Inpatient Hospital Stay: Payer: Medicare Other

## 2022-02-13 ENCOUNTER — Inpatient Hospital Stay
Admit: 2022-02-13 | Discharge: 2022-02-13 | Disposition: A | Discharge status: Home / Self Care | Payer: Medicare Other | Attending: Internal Medicine | Admitting: Internal Medicine

## 2022-02-13 DIAGNOSIS — E875 Hyperkalemia: Secondary | ICD-10-CM | POA: Diagnosis not present

## 2022-02-13 DIAGNOSIS — N179 Acute kidney failure, unspecified: Secondary | ICD-10-CM | POA: Diagnosis not present

## 2022-02-13 DIAGNOSIS — I2489 Other forms of acute ischemic heart disease: Secondary | ICD-10-CM | POA: Diagnosis not present

## 2022-02-13 DIAGNOSIS — A419 Sepsis, unspecified organism: Secondary | ICD-10-CM | POA: Diagnosis not present

## 2022-02-13 LAB — ECHOCARDIOGRAM COMPLETE
AR max vel: 2.34 cm2
AV Area VTI: 2.63 cm2
AV Area mean vel: 2.37 cm2
AV Mean grad: 6 mmHg
AV Peak grad: 10.4 mmHg
Ao pk vel: 1.61 m/s
Area-P 1/2: 4.56 cm2
Calc EF: 64.3 %
Height: 63 in
S' Lateral: 2.5 cm
Single Plane A2C EF: 65.5 %
Single Plane A4C EF: 64.2 %
Weight: 2480 oz

## 2022-02-13 LAB — CBC WITH DIFFERENTIAL/PLATELET
Abs Immature Granulocytes: 0.04 10*3/uL (ref 0.00–0.07)
Basophils Absolute: 0 10*3/uL (ref 0.0–0.1)
Basophils Relative: 0 %
Eosinophils Absolute: 0.2 10*3/uL (ref 0.0–0.5)
Eosinophils Relative: 2 %
HCT: 30.2 % — ABNORMAL LOW (ref 36.0–46.0)
Hemoglobin: 10.1 g/dL — ABNORMAL LOW (ref 12.0–15.0)
Immature Granulocytes: 0 %
Lymphocytes Relative: 14 %
Lymphs Abs: 1.3 10*3/uL (ref 0.7–4.0)
MCH: 31.7 pg (ref 26.0–34.0)
MCHC: 33.4 g/dL (ref 30.0–36.0)
MCV: 94.7 fL (ref 80.0–100.0)
Monocytes Absolute: 1.5 10*3/uL — ABNORMAL HIGH (ref 0.1–1.0)
Monocytes Relative: 17 %
Neutro Abs: 6 10*3/uL (ref 1.7–7.7)
Neutrophils Relative %: 67 %
Platelets: 244 10*3/uL (ref 150–400)
RBC: 3.19 MIL/uL — ABNORMAL LOW (ref 3.87–5.11)
RDW: 14 % (ref 11.5–15.5)
WBC: 9 10*3/uL (ref 4.0–10.5)
nRBC: 0 % (ref 0.0–0.2)

## 2022-02-13 LAB — BASIC METABOLIC PANEL
Anion gap: 13 (ref 5–15)
BUN: 89 mg/dL — ABNORMAL HIGH (ref 8–23)
CO2: 22 mmol/L (ref 22–32)
Calcium: 7.7 mg/dL — ABNORMAL LOW (ref 8.9–10.3)
Chloride: 107 mmol/L (ref 98–111)
Creatinine, Ser: 5.24 mg/dL — ABNORMAL HIGH (ref 0.44–1.00)
GFR, Estimated: 8 mL/min — ABNORMAL LOW (ref >60)
Glucose, Bld: 226 mg/dL — ABNORMAL HIGH (ref 70–99)
Potassium: 4 mmol/L (ref 3.5–5.1)
Sodium: 142 mmol/L (ref 135–145)

## 2022-02-13 LAB — PHOSPHORUS: Phosphorus: 5.9 mg/dL — ABNORMAL HIGH (ref 2.5–4.6)

## 2022-02-13 LAB — URINE CULTURE: Culture: >=100000 — AB

## 2022-02-13 LAB — GLUCOSE, CAPILLARY
Glucose-Capillary: 333 mg/dL — ABNORMAL HIGH (ref 70–99)
Glucose-Capillary: 344 mg/dL — ABNORMAL HIGH (ref 70–99)
Glucose-Capillary: 220 mg/dL — ABNORMAL HIGH (ref 70–99)

## 2022-02-13 LAB — MAGNESIUM: Magnesium: 1.7 mg/dL (ref 1.7–2.4)

## 2022-02-13 LAB — CREATININE, URINE, RANDOM: Creatinine, Urine: 33 mg/dL

## 2022-02-13 LAB — SODIUM, URINE, RANDOM: Sodium, Ur: 126 mmol/L

## 2022-02-13 MED ORDER — INSULIN ASPART 100 UNIT/ML IJ SOLN
0.0000 [IU] | Freq: Three times a day (TID) | INTRAMUSCULAR | Status: DC
  Administered 2022-02-13 (×2): 4 [IU] via SUBCUTANEOUS
  Administered 2022-02-14: 1 [IU] via SUBCUTANEOUS
  Administered 2022-02-15: 2 [IU] via SUBCUTANEOUS
  Administered 2022-02-16: 3 [IU] via SUBCUTANEOUS
  Administered 2022-02-16: 1 [IU] via SUBCUTANEOUS
  Filled 2022-02-13 (×8): qty 1

## 2022-02-13 MED ORDER — INSULIN GLARGINE-YFGN 100 UNIT/ML ~~LOC~~ SOLN
5.0000 [IU] | Freq: Every day | SUBCUTANEOUS | Status: DC
  Administered 2022-02-13 – 2022-02-16 (×4): 5 [IU] via SUBCUTANEOUS
  Filled 2022-02-13 (×4): qty 0.05

## 2022-02-13 MED ORDER — HEPARIN SODIUM (PORCINE) 5000 UNIT/ML IJ SOLN
5000.0000 [IU] | Freq: Three times a day (TID) | INTRAMUSCULAR | Status: DC
  Administered 2022-02-13 – 2022-02-16 (×9): 5000 [IU] via SUBCUTANEOUS
  Filled 2022-02-13 (×9): qty 1

## 2022-02-13 MED ORDER — OSELTAMIVIR PHOSPHATE 30 MG PO CAPS
30.0000 mg | ORAL_CAPSULE | ORAL | Status: DC
  Administered 2022-02-14: 30 mg via ORAL
  Filled 2022-02-13 (×2): qty 1

## 2022-02-13 NOTE — Progress Notes (Signed)
  Echocardiogram 2D Echocardiogram has been performed.  Paula Hansen 02/13/2022, 3:01 PM

## 2022-02-13 NOTE — Progress Notes (Addendum)
PROGRESS NOTE    Paula Hansen  VOP:[id number] DOB: 07/22/1934 DOA: 02/10/2022 PCP: Baxter Hire, MD    Chief Complaint  Patient presents with   Hypoglycemia    Brief Narrative:  Patient 87 year old female history of GERD, hypertension hyperlipidemia OA hypothyroidism CVA, DM2, neuropathy, CKD, subclavian steal syndrome presented to the ED on admission with altered mental status.  Patient on arrival noted to be in altered mental status, noted to have blood glucose in the 40s received D5W with some improvement in responsiveness.  Course in the ED and workup concerning for acute renal failure creatinine of 8.43, hyperkalemic, noted to be septic felt secondary to UTI, COVID-19 PCR negative, noted to have elevated troponins concerning for non-STEMI versus demand ischemia.  Patient initially placed on broad-spectrum IV antibiotics, pancultured, placed on IV fluids, bicarb drip and due to high risk of decompensation PCCM consulted.  Patient subsequently admitted to the ICU, noted to have some concerns of acute hypoxic respiratory failure felt initially secondary to pulmonary edema placed on high flow nasal cannula, nephrology consulted.  PCCM discussed with family and initial decision with goals of care was to transition to full comfort measures.  Patient subsequently transferred to the floor and transferred to the hospitalist team.  Patient seem to improved clinically, more alert and assessed by palliative care.  Family decided to treat reversible illnesses at this time with further evaluation by palliative care and PT OT.   Assessment & Plan:   Principal Problem:   Sepsis ([place]) Active Problems:   Hyperkalemia   AKI (acute kidney injury) ([place])   Demand ischemia   Acute encephalopathy   Type II diabetes mellitus ([place])   Hypothyroidism   Essential hypertension   Sepsis secondary to UTI ([place])   Acute renal failure ([place])  #1 septic shock secondary to UTI, POA -Patient admitted with altered  mental status, noted to be hypothermic on admission with leukocytosis, elevated lactic acid level, urinalysis consistent with UTI. -CT abdomen and pelvis done on admission concerning for mild colitis however no noted GI symptoms on presentation. -Chest x-ray done initially reassuring. -Patient noted to decline, with septic shock, noted to be with multiorgan failure with acute renal failure, hyperkalemia, lactic acidosis, concern for respiratory distress/hypoxia, critical care medicine discussed with family initially patient was made comfort. -Patient has improved clinically, alert, following commands, improvement with blood pressure. -Urine cultures with > 100,000 colonies of Klebsiella pneumonia.  Blood cultures with no growth to date. -COVID-19 PCR negative, influenza A and B PCR negative. -Patient more alert today, somewhat clinically improved. -Case discussed again with family who initially felt in discussion with critical care initially on presentation that patient would have deteriorated quickly however with clinical improvement I in agreement for treatment of reversible causes. -Repeat BMET today with a creatinine of 5.24 which is in line with slow recovery of renal function from admission. -CBC with improvement with leukocytosis.  -Continue gentle hydration with IV fluids, IV antibiotics. -Supportive care. -Palliative care consulted for goals of care and reassessment.  2.  Acute renal failure/hyperkalemia -Patient had presented with acute renal failure on admission with a creatinine as high as 8.43 on day of admission with severe hyperkalemia with potassium of 7.4. -Felt likely secondary to prerenal azotemia in the setting of septic shock and sepsis. -Patient seen by nephrology on admission, PCCM discussed with family who felt patient would not be interested in hemodialysis. -Due to patient's acute deterioration and presentation PCCM discussed with family and initially decision was  made to  transition to full comfort measures. -Patient transferred to floor, has improved clinically, repeat comprehensive metabolic profile done on 92/33/0076 noted a creatinine of 0.69 likely an outlier as repeat be met done today 02/13/2022 with a creatinine of 5.24 which is in line of slow improvement of renal function with hydration. -Check urine sodium, urine creatinine, renal ultrasound. -Family at this time interested in treating the reversible things. -Family not interested in hemodialysis. -Continue IV fluids, treat UTI with empiric IV antibiotics. -Monitor urine output. -Repeat labs in the AM. -Supportive care.  3.  Non-STEMI/demand ischemia -Patient noted to have a significantly elevated troponin of 533>>> 1036>> 1913>> 2518. -Patient initially followed by PCCM, due to patient's presentation and in discussions with family patient initially made comfort measures. -Patient improved clinically. -Patient denies any chest pain. -EKG done on admission with no significant ischemic changes noted. -Repeat 2D echo pending. -If 2D echo is abnormal we will consult with cardiology for further evaluation and management. -Continue home regimen aspirin, Plavix, statin. -Supportive care.  4.  Acute hypoxemic respiratory failure -Patient on admission noted to go into acute hypoxemic respiratory failure in the setting of renal failure, concern initially for pulmonary edema noted to be on high flow nasal cannula. -PCCM initially discussed case with family initially patient transition to full comfort measures. -Patient improved clinically, noted to have sats of 92 to 93% on room air. -Gentle hydration and monitor respiratory status.    5.  Acute metabolic encephalopathy -Likely secondary to sepsis and uremic encephalopathy. -Renal function seems to have improved with repeat labs today. -Patient currently afebrile, improved clinically. -Blood cultures pending with no growth to date. -Urine cultures with  > 100,000 colonies of Klebsiella  -Continue IV Rocephin. -Supportive care.  6.  Diabetes mellitus type 2 -Hemoglobin A1c 7.2 (02/06/2022) -CBG checked this afternoon at 333.   -Hold home regimen oral hypoglycemic agents. -Start low-dose Semglee 5 units daily. -SSI.  7.  Hypothyroidism -Synthroid.  8.  Hypertension -Continue to hold home regimen antihypertensive medications.  9.  Recent TIA -Patient noted to have a TIA during recent hospitalization 02/07/2022. -Patient noted to have been discharged on Plavix and aspirin which have been resumed for secondary stroke prophylaxis.  -Continue statin.   10.  GERD -Continue PPI.    11.  Prognosis -Initially on admission patient was in multiorgan failure, noted to be deteriorating, seen by PCCM will discuss with family goals of care discussion was obtained and patient was transition to full comfort measures. -Patient transferred to the floor, patient however has improved clinically with improvement with renal function, electrolytes more alert and following commands appropriately. -Palliative care consulted to rediscuss goals of care. -Will likely need outpatient follow-up with palliative care on discharge.   DVT prophylaxis: Lovenox Code Status: DNR Family Communication: Tried calling son on the telephone and went to voicemail.  Disposition: TBD  Status is: Inpatient Remains inpatient appropriate because: Severity of illness/unsafe disposition   Consultants:  PCCM: Dr.Dgayli 02/10/2022 Nephrology: Dr. Candiss Norse 02/11/2022 Palliative care: Quinn Axe 02/12/2022  Procedures:  CT head 02/10/2022 CT abdomen and pelvis 02/10/2022 Chest x-ray 02/10/2022   Antimicrobials:  IV Rocephin 02/12/2022>>>   Subjective: Sleeping but easily arousable.  Alert to self place.  Knows his December but not sure of the year.  Denies any chest pain.  No shortness of breath.  No abdominal pain.  Stating that her husband needs to bring in her  glasses so she can see better.  Overall feels well.  Tolerating diet.   Objective: Vitals:   02/12/22 0803 02/12/22 1650 02/13/22 0918 02/13/22 1647  BP: (!) 148/54 127/79 (!) 178/69 (!) 140/85  Pulse: 79 79 83 82  Resp: 17 18    Temp: 97.8 F (36.6 C) (!) 97.5 F (36.4 C) 97.6 F (36.4 C) 97.6 F (36.4 C)  TempSrc:  Oral    SpO2: 92% 93% 92% 100%  Weight:      Height:        Intake/Output Summary (Last 24 hours) at 02/13/2022 1806 Last data filed at 02/13/2022 0558 Gross per 24 hour  Intake 921.34 ml  Output 3000 ml  Net -2078.66 ml    Filed Weights   02/10/22 1352  Weight: 70.3 kg    Examination:  General exam: Alert.  Dry mucous membranes.  Respiratory system: Lungs clear to auscultation bilaterally.  No wheezes, no crackles, no rhonchi.  Fair air movement.  Speaking in full sentences.   Cardiovascular system: Regular rate rhythm no murmurs rubs or gallops.  No JVD.  No lower extremity edema.  Gastrointestinal system: Abdomen is soft, nontender, nondistended, positive bowel sounds.  No rebound.  No guarding.  Central nervous system: Alert and oriented x 2. No focal neurological deficits.  Moving extremities spontaneously. Extremities: Symmetric 5 x 5 power. Skin: No rashes, lesions or ulcers Psychiatry: Judgement and insight appear normal. Mood & affect appropriate.     Data Reviewed: I have personally reviewed following labs and imaging studies  CBC: Recent Labs  Lab 02/10/22 1515 02/11/22 0500 02/12/22 1337 02/13/22 0510  WBC 17.6* 16.9* 11.8* 9.0  NEUTROABS  --  11.9* 8.8* 6.0  HGB 10.4* 8.1* 10.2* 10.1*  HCT 34.2* 25.5* 30.4* 30.2*  MCV 104.6* 100.0 94.4 94.7  PLT 289 269 251 244     Basic Metabolic Panel: Recent Labs  Lab 02/10/22 1743 02/10/22 2322 02/11/22 0753 02/11/22 0754 02/12/22 1313 02/13/22 0510  NA 141 142  --  141 141 142  K 7.2* 5.9*  --  5.8* 4.1 4.0  CL 108 109  --  106 109 107  CO2 12* 15*  --  16* 26 22  GLUCOSE 59*  73  --  92 125* 226*  BUN 93* 100*  --  100* 12 89*  CREATININE 7.77* 7.56*  --  7.54* 0.69 5.24*  CALCIUM 7.8* 7.6*  --  7.5* 8.3* 7.7*  MG  --   --  1.7  --  2.1 1.7  PHOS  --   --   --   --   --  5.9*     GFR: Estimated Creatinine Clearance: 7.4 mL/min (A) (by C-G formula based on SCr of 5.24 mg/dL (H)).  Liver Function Tests: Recent Labs  Lab 02/11/22 0754 02/12/22 1313  AST 42* 50*  ALT 21 15  ALKPHOS 49 63  BILITOT 0.7 1.0  PROT 5.7* 6.4*  ALBUMIN 3.0* 2.8*     CBG: Recent Labs  Lab 02/11/22 0133 02/11/22 0203 02/11/22 0616 02/13/22 1329 02/13/22 1750  GLUCAP 52* 149* 104* 333* 344*      Recent Results (from the past 240 hour(s))  Resp panel by RT-PCR (RSV, Flu A&B, Covid) Anterior Nasal Swab     Status: None   Collection Time: 02/10/22  3:13 PM   Specimen: Anterior Nasal Swab  Result Value Ref Range Status   SARS Coronavirus 2 by RT PCR NEGATIVE NEGATIVE Final    Comment: (NOTE) SARS-CoV-2 target nucleic acids are NOT DETECTED.  The SARS-CoV-2  RNA is generally detectable in upper respiratory specimens during the acute phase of infection. The lowest concentration of SARS-CoV-2 viral copies this assay can detect is 138 copies/mL. A negative result does not preclude SARS-Cov-2 infection and should not be used as the sole basis for treatment or other patient management decisions. A negative result may occur with  improper specimen collection/handling, submission of specimen other than nasopharyngeal swab, presence of viral mutation(s) within the areas targeted by this assay, and inadequate number of viral copies(<138 copies/mL). A negative result must be combined with clinical observations, patient history, and epidemiological information. The expected result is Negative.  Fact Sheet for Patients:  EntrepreneurPulse.com.au  Fact Sheet for Healthcare Providers:  IncredibleEmployment.be  This test is no t yet  approved or cleared by the [place] FDA and  has been authorized for detection and/or diagnosis of SARS-CoV-2 by FDA under an Emergency Use Authorization (EUA). This EUA will remain  in effect (meaning this test can be used) for the duration of the COVID-19 declaration under Section 564(b)(1) of the Act, 21 U.S.C.section 360bbb-3(b)(1), unless the authorization is terminated  or revoked sooner.       Influenza A by PCR NEGATIVE NEGATIVE Final   Influenza B by PCR NEGATIVE NEGATIVE Final    Comment: (NOTE) The Xpert Xpress SARS-CoV-2/FLU/RSV plus assay is intended as an aid in the diagnosis of influenza from Nasopharyngeal swab specimens and should not be used as a sole basis for treatment. Nasal washings and aspirates are unacceptable for Xpert Xpress SARS-CoV-2/FLU/RSV testing.  Fact Sheet for Patients: EntrepreneurPulse.com.au  Fact Sheet for Healthcare Providers: IncredibleEmployment.be  This test is not yet approved or cleared by the [place] FDA and has been authorized for detection and/or diagnosis of SARS-CoV-2 by FDA under an Emergency Use Authorization (EUA). This EUA will remain in effect (meaning this test can be used) for the duration of the COVID-19 declaration under Section 564(b)(1) of the Act, 21 U.S.C. section 360bbb-3(b)(1), unless the authorization is terminated or revoked.     Resp Syncytial Virus by PCR NEGATIVE NEGATIVE Final    Comment: (NOTE) Fact Sheet for Patients: EntrepreneurPulse.com.au  Fact Sheet for Healthcare Providers: IncredibleEmployment.be  This test is not yet approved or cleared by the [place] FDA and has been authorized for detection and/or diagnosis of SARS-CoV-2 by FDA under an Emergency Use Authorization (EUA). This EUA will remain in effect (meaning this test can be used) for the duration of the COVID-19 declaration under Section 564(b)(1) of  the Act, 21 U.S.C. section 360bbb-3(b)(1), unless the authorization is terminated or revoked.  Performed at [facility], [address] [place], [place] [address]   Blood culture (routine x 2)     Status: None (Preliminary result)   Collection Time: 02/10/22  3:13 PM   Specimen: BLOOD  Result Value Ref Range Status   Specimen Description BLOOD BLOOD LEFT ARM  Final   Special Requests   Final    BOTTLES DRAWN AEROBIC AND ANAEROBIC Blood Culture adequate volume   Culture   Final    NO GROWTH 3 DAYS Performed at [facility], [address] [place], [place] [address]    Report Status PENDING  Incomplete  Blood culture (routine x 2)     Status: None (Preliminary result)   Collection Time: 02/10/22  3:17 PM   Specimen: BLOOD  Result Value Ref Range Status   Specimen Description BLOOD BLOOD RIGHT HAND  Final   Special Requests   Final  BOTTLES DRAWN AEROBIC AND ANAEROBIC Blood Culture adequate volume   Culture   Final    NO GROWTH 3 DAYS Performed at [facility], [address] [place], [place] [address]    Report Status PENDING  Incomplete  Urine Culture     Status: Abnormal   Collection Time: 02/10/22  6:25 PM   Specimen: Urine, Random  Result Value Ref Range Status   Specimen Description   Final    URINE, RANDOM Performed at [facility], [address] [place], [place] [address]    Special Requests   Final    NONE Performed at [facility], [address] [place], [place] [address]    Culture >=100,000 COLONIES/mL KLEBSIELLA PNEUMONIAE (A)  Final   Report Status 02/13/2022 FINAL  Final   Organism ID, Bacteria KLEBSIELLA PNEUMONIAE (A)  Final      Susceptibility   Klebsiella pneumoniae - MIC*    AMPICILLIN >=32 RESISTANT Resistant     CEFAZOLIN <=4 SENSITIVE Sensitive     CEFEPIME <=0.12 SENSITIVE Sensitive     CEFTRIAXONE <=0.25 SENSITIVE Sensitive     CIPROFLOXACIN <=0.25 SENSITIVE Sensitive     GENTAMICIN  <=1 SENSITIVE Sensitive     IMIPENEM 0.5 SENSITIVE Sensitive     NITROFURANTOIN 64 INTERMEDIATE Intermediate     TRIMETH/SULFA <=20 SENSITIVE Sensitive     AMPICILLIN/SULBACTAM 4 SENSITIVE Sensitive     PIP/TAZO <=4 SENSITIVE Sensitive     * >=100,000 COLONIES/mL KLEBSIELLA PNEUMONIAE  MRSA Next Gen by PCR, Nasal     Status: Abnormal   Collection Time: 02/11/22  1:45 AM   Specimen: Nasal Mucosa; Nasal Swab  Result Value Ref Range Status   MRSA by PCR Next Gen DETECTED (A) NOT DETECTED Final    Comment: RESULT CALLED TO, READ BACK BY AND VERIFIED WITH: NALLELY SOSA 02/11/22 0503 MW (NOTE) The GeneXpert MRSA Assay (FDA approved for NASAL specimens only), is one component of a comprehensive MRSA colonization surveillance program. It is not intended to diagnose MRSA infection nor to guide or monitor treatment for MRSA infections. Test performance is not FDA approved in patients less than 28 years old. Performed at [facility], [address] [place], [place] [address]          Radiology Studies: ECHOCARDIOGRAM COMPLETE  Result Date: 02/13/2022    ECHOCARDIOGRAM REPORT   Patient Name:   JAKAILA NORMENT Toon Date of Exam: 02/13/2022 Medical Rec #:  [id number]    Height:       63.0 in Accession #:    [id number]   Weight:       155.0 lb Date of Birth:  June 05, 1934    BSA:          1.735 m Patient Age:    47 years     BP:           178/69 mmHg Patient Gender: F            HR:           93 bpm. Exam Location:  [facility] Procedure: 2D Echo, Cardiac Doppler and Color Doppler Indications:    Elevated troponin  History:        Patient has prior history of Echocardiogram examinations, most                 recent 02/06/2022. Risk Factors:Hypertension and Diabetes.  Sonographer:    Eartha Inch Referring Phys: [id number] Debie Ashline V Nihaal Friesen  Sonographer Comments: Image acquisition challenging due  to respiratory motion. IMPRESSIONS  1. Left ventricular ejection fraction, by estimation, is >75%. The left  ventricle has hyperdynamic function. The left ventricle has no regional wall motion abnormalities. Left ventricular diastolic parameters are consistent with Grade II diastolic dysfunction (pseudonormalization).  2. Right ventricular systolic function is normal. The right ventricular size is normal.  3. Left atrial size was mildly dilated.  4. Right atrial size was mildly dilated.  5. The mitral valve is normal in structure. Mild mitral valve regurgitation. No evidence of mitral stenosis.  6. The aortic valve is calcified. Aortic valve regurgitation is trivial. Aortic valve sclerosis/calcification is present, without any evidence of aortic stenosis.  7. The inferior vena cava is normal in size with greater than 50% respiratory variability, suggesting right atrial pressure of 3 mmHg. FINDINGS  Left Ventricle: Left ventricular ejection fraction, by estimation, is >75%. The left ventricle has hyperdynamic function. The left ventricle has no regional wall motion abnormalities. The left ventricular internal cavity size was normal in size. There is no left ventricular hypertrophy. Left ventricular diastolic parameters are consistent with Grade II diastolic dysfunction (pseudonormalization). Right Ventricle: The right ventricular size is normal. No increase in right ventricular wall thickness. Right ventricular systolic function is normal. Left Atrium: Left atrial size was mildly dilated. Right Atrium: Right atrial size was mildly dilated. Pericardium: There is no evidence of pericardial effusion. Mitral Valve: The mitral valve is normal in structure. Mild mitral valve regurgitation. No evidence of mitral valve stenosis. Tricuspid Valve: The tricuspid valve is normal in structure. Tricuspid valve regurgitation is mild . No evidence of tricuspid stenosis. Aortic Valve: The aortic valve is calcified. Aortic valve regurgitation is trivial. Aortic valve sclerosis/calcification is present, without any evidence of aortic  stenosis. Aortic valve mean gradient measures 6.0 mmHg. Aortic valve peak gradient measures 10.4 mmHg. Aortic valve area, by VTI measures 2.63 cm. Pulmonic Valve: The pulmonic valve was normal in structure. Pulmonic valve regurgitation is not visualized. No evidence of pulmonic stenosis. Aorta: The aortic root is normal in size and structure. Venous: The inferior vena cava is normal in size with greater than 50% respiratory variability, suggesting right atrial pressure of 3 mmHg. IAS/Shunts: No atrial level shunt detected by color flow Doppler.  LEFT VENTRICLE PLAX 2D LVIDd:         3.40 cm     Diastology LVIDs:         2.50 cm     LV e' medial:    10.80 cm/s LV PW:         1.20 cm     LV E/e' medial:  14.6 LV IVS:        0.90 cm     LV e' lateral:   9.36 cm/s LVOT diam:     2.20 cm     LV E/e' lateral: 16.8 LV SV:         81 LV SV Index:   46 LVOT Area:     3.80 cm  LV Volumes (MOD) LV vol d, MOD A2C: 76.6 ml LV vol d, MOD A4C: 92.5 ml LV vol s, MOD A2C: 26.4 ml LV vol s, MOD A4C: 33.1 ml LV SV MOD A2C:     50.2 ml LV SV MOD A4C:     92.5 ml LV SV MOD BP:      54.0 ml RIGHT VENTRICLE             IVC RV S prime:     15.30 cm/s  IVC  diam: 2.20 cm TAPSE (M-mode): 2.3 cm LEFT ATRIUM             Index        RIGHT ATRIUM           Index LA diam:        3.40 cm 1.96 cm/m   RA Area:     10.20 cm LA Vol (A2C):   48.3 ml 27.84 ml/m  RA Volume:   21.00 ml  12.10 ml/m LA Vol (A4C):   34.1 ml 19.65 ml/m LA Biplane Vol: 41.8 ml 24.09 ml/m  AORTIC VALVE AV Area (Vmax):    2.34 cm AV Area (Vmean):   2.37 cm AV Area (VTI):     2.63 cm AV Vmax:           161.00 cm/s AV Vmean:          117.000 cm/s AV VTI:            0.307 m AV Peak Grad:      10.4 mmHg AV Mean Grad:      6.0 mmHg LVOT Vmax:         99.20 cm/s LVOT Vmean:        72.800 cm/s LVOT VTI:          0.212 m LVOT/AV VTI ratio: 0.69  AORTA Ao Root diam: 2.80 cm Ao Asc diam:  3.50 cm MITRAL VALVE MV Area (PHT): 4.56 cm     SHUNTS MV Decel Time: 167 msec      Systemic VTI:  0.21 m MV E velocity: 157.50 cm/s  Systemic Diam: 2.20 cm Neoma Laming Electronically signed by Neoma Laming Signature Date/Time: 02/13/2022/3:58:14 PM    Final    US RENAL  Result Date: 02/13/2022 CLINICAL DATA:  Renal dysfunction EXAM: RENAL / URINARY TRACT ULTRASOUND COMPLETE COMPARISON:  CT done on 02/10/2022 FINDINGS: Right Kidney: Renal measurements: 9.7 x 5 x 4.4 cm = volume: 110.9 mL. There is no hydronephrosis. There is slightly increased cortical echogenicity. There is no focal cortical thinning or perinephric fluid collection. Left Kidney: Renal measurements: 9.7 x 4.6 x 4.3 cm = volume: 100.3 mL. There is no hydronephrosis. There is no perinephric fluid collection. There is 1.2 by 2.3 x 1.8 cm cyst in the upper pole. There is 1.2 x 1 x 1.3 cm cyst in the midportion. Bladder: Appears normal for degree of bladder distention. Other: None. IMPRESSION: There is no hydronephrosis.  Left renal cysts. Electronically Signed   By: Elmer Picker M.D.   On: 02/13/2022 13:22        Scheduled Meds:  aspirin EC  81 mg Oral QODAY   Chlorhexidine Gluconate Cloth  6 each Topical Daily   clopidogrel  75 mg Oral Daily   heparin injection (subcutaneous)  5,000 Units Subcutaneous Q8H   insulin aspart  0-6 Units Subcutaneous TID WC   levothyroxine  50 mcg Oral Q0600   loratadine  10 mg Oral Daily   [START ON 02/14/2022] oseltamivir  30 mg Oral QODAY   pantoprazole  40 mg Oral Daily   rosuvastatin  10 mg Oral Daily   sodium chloride flush  3 mL Intravenous Q12H   Continuous Infusions:  sodium chloride Stopped (02/12/22 1900)   sodium chloride     sodium chloride 100 mL/hr at 02/13/22 1024   cefTRIAXone (ROCEPHIN)  IV 2 g (02/13/22 1403)     LOS: 3 days    Time spent: 40 minutes    Irine Seal,  MD [facility]   To contact the attending provider between 7A-7P or the covering provider during after hours 7P-7A, please log into the web site [email or web address] and  access using universal [place] password for that web site. If you do not have the password, please call the hospital operator.  02/13/2022, 6:06 PM

## 2022-02-13 NOTE — Evaluation (Signed)
Physical Therapy Evaluation Patient Details Name: Paula Hansen MRN: [id number] DOB: [birth year] Today's Date: 02/13/2022  History of Present Illness  Patient is an 87 year old female who was admitted to ED due to AMS and hypoglycemia. PMH includes TIA, Type II DM, CKD, HTN, hypothyroidism. Patient has a home health nurse, was recently admitted to the hospital on 12/15-12/17 for TIA.   Clinical Impression  Patient is a pleasantly confused 87 year old female who presents with generalized weakness. Prior to hospital admission, pt was at home with family and an aide. Majority of history obtained through chart notes as patient does demonstrate intermittent confusion. Patient is in bed upon PT and OT arrival and agreeable to participate with therapy as she is wet and would like to be changed. Patient and bedding changed and patient demonstrates mod I for bed mobility and CGA for sit to stands. OT assisted with ADL of sock donning while PT assisted with mobility and transfers. Patient does ambulate with RW but requires intermittent assistance with pushing of walker due to stopping/task orientation deficits. Patient does have LE weakness and is returned to bed with needs met.  Pt would benefit from skilled PT to address noted impairments and functional limitations (see below for any additional details).  Upon hospital discharge, pt would benefit from home health physical therapy.        Recommendations for follow up therapy are one component of a multi-disciplinary discharge planning process, led by the attending physician.  Recommendations may be updated based on patient status, additional functional criteria and insurance authorization.  Follow Up Recommendations Home health PT      Assistance Recommended at Discharge Frequent or constant Supervision/Assistance  Patient can return home with the following  A little help with walking and/or transfers;Assist for transportation;Assistance with  cooking/housework;A little help with bathing/dressing/bathroom;Help with stairs or ramp for entrance;Direct supervision/assist for medications management    Equipment Recommendations None recommended by PT  Recommendations for Other Services       Functional Status Assessment Patient has had a recent decline in their functional status and demonstrates the ability to make significant improvements in function in a reasonable and predictable amount of time.     Precautions / Restrictions Precautions Precautions: Fall Restrictions Weight Bearing Restrictions: No      Mobility  Bed Mobility Overal bed mobility: Modified Independent Bed Mobility: Supine to Sit     Supine to sit: Modified independent (Device/Increase time) Sit to supine: Modified independent (Device/Increase time)   General bed mobility comments: extra time to perform, no assistance required    Transfers Overall transfer level: Needs assistance Equipment used: Rolling walker (2 wheels) Transfers: Sit to/from Stand Sit to Stand: Min guard           General transfer comment: intermittent cues for safety; hand placement    Ambulation/Gait Ambulation/Gait assistance: Min assist, Min guard Gait Distance (Feet): 80 Feet Assistive device: Rolling walker (2 wheels) Gait Pattern/deviations: Narrow base of support, Step-through pattern, Decreased stride length, Shuffle       General Gait Details: patient requires assistance pushing walker occasionally due to stopping.  Stairs            Wheelchair Mobility    Modified Rankin (Stroke Patients Only)       Balance Overall balance assessment: Needs assistance Sitting-balance support: Feet supported Sitting balance-Leahy Scale: Fair Sitting balance - Comments: difficulty with midline, remains upright Postural control: Posterior lean Standing balance support: Bilateral upper extremity supported, During functional  activity Standing balance-Leahy  Scale: Fair                               Pertinent Vitals/Pain Pain Assessment Pain Assessment: No/denies pain    Home Living Family/patient expects to be discharged to:: Private residence Living Arrangements: Spouse/significant other Available Help at Discharge: Family Type of Home: House Home Access: Stairs to enter Entrance Stairs-Rails: Right Entrance Stairs-Number of Steps: 3   Home Layout: Able to live on main level with bedroom/bathroom;Two level Home Equipment: Advice worker (2 wheels);Cane - single point Additional Comments: sits in in the tub; history taken from chart    Prior Function Prior Level of Function : Needs assist             Mobility Comments: amb with no AD; son reports frequent falls ADLs Comments: [place] care comes from 10-8 every day to assist with IADLs per son; pt reports she generally gets dressed on her own     Hand Dominance        Extremity/Trunk Assessment   Upper Extremity Assessment Upper Extremity Assessment: Defer to OT evaluation    Lower Extremity Assessment Lower Extremity Assessment: Generalized weakness (grossly 4/5 bilateral LE's.)    Cervical / Trunk Assessment Cervical / Trunk Assessment: Normal  Communication   Communication: No difficulties  Cognition Arousal/Alertness: Awake/alert Behavior During Therapy: WFL for tasks assessed/performed Overall Cognitive Status: No family/caregiver present to determine baseline cognitive functioning                                 General Comments: Patient oriented to herself and location. Not oriented to year, time/date.        General Comments      Exercises Other Exercises Other Exercises: Patient educated on role of PT in acute care setting, safe mobility and transfers, use of RW.   Assessment/Plan    PT Assessment Patient needs continued PT services  PT Problem List Decreased strength;Decreased activity  tolerance;Decreased safety awareness;Decreased balance;Decreased mobility;Decreased cognition       PT Treatment Interventions DME instruction;Balance training;Gait training;Neuromuscular re-education;Stair training;Patient/family education;Functional mobility training;Therapeutic activities;Therapeutic exercise    PT Goals (Current goals can be found in the Care Plan section)  Acute Rehab PT Goals Patient Stated Goal: to return home PT Goal Formulation: With patient Time For Goal Achievement: 02/27/22 Potential to Achieve Goals: Good    Frequency Min 2X/week     Co-evaluation PT/OT/SLP Co-Evaluation/Treatment: Yes Reason for Co-Treatment: Necessary to address cognition/behavior during functional activity;To address functional/ADL transfers PT goals addressed during session: Mobility/safety with mobility;Balance;Proper use of DME;Strengthening/ROM OT goals addressed during session: ADL's and self-care;Proper use of Adaptive equipment and DME;Strengthening/ROM       AM-PAC PT "6 Clicks" Mobility  Outcome Measure Help needed turning from your back to your side while in a flat bed without using bedrails?: None Help needed moving from lying on your back to sitting on the side of a flat bed without using bedrails?: A Little Help needed moving to and from a bed to a chair (including a wheelchair)?: A Little Help needed standing up from a chair using your arms (e.g., wheelchair or bedside chair)?: A Little Help needed to walk in hospital room?: A Little Help needed climbing 3-5 steps with a railing? : A Lot 6 Click Score: 18    End of Session Equipment Utilized  During Treatment: Gait belt Activity Tolerance: Patient tolerated treatment well;Patient limited by fatigue Patient left: in bed;with call bell/phone within reach;with bed alarm set Nurse Communication: Mobility status PT Visit Diagnosis: Muscle weakness (generalized) (M62.81);History of falling (Z91.81);Difficulty in walking,  not elsewhere classified (R26.2);Unsteadiness on feet (R26.81);Other abnormalities of gait and mobility (R26.89)    Time: 0148-4039 PT Time Calculation (min) (ACUTE ONLY): 25 min   Charges:   PT Evaluation $PT Eval Moderate Complexity: 1 Mod         Janna Arch, PT, DPT  02/13/2022, 9:02 AM

## 2022-02-13 NOTE — Evaluation (Signed)
Occupational Therapy Evaluation Patient Details Name: Paula Hansen MRN: [id number] DOB: 16-Oct-1934 Today's Date: 02/13/2022   History of Present Illness Patient is an 87 year old female who was admitted to ED due to AMS and hypoglycemia. PMH includes TIA, Type II DM, CKD, HTN, hypothyroidism. Patient has a home health nurse, was recently admitted to the hospital on 12/15-12/17 for TIA.   Clinical Impression   Patient presenting with decreased independence in self care, balance, functional mobility/transfers, endurance, and safety awareness. Pt poor historian, therefore, PLOF obtained from chart review. She was oriented to self, month, and location this date. Prior to admission, pt lived with spouse and was IND for dressing. [facility] comes from 10 am-8 pm every day to assist with IADLs. Pt currently functioning at Mod I for bed mobility, Min guard to stand from EOB, Min guard for functional mobility into hallway using RW (Min A for RW management), set up-supervision for seated grooming tasks, and Min A for LB dressing. Pt will benefit from acute OT to increase overall independence in the areas of ADLs and functional mobility in order to safely discharge home. Pt could benefit from [facility] following D/C to decrease falls risk, improve balance, and maximize independence in self-care within own home environment.      Recommendations for follow up therapy are one component of a multi-disciplinary discharge planning process, led by the attending physician.  Recommendations may be updated based on patient status, additional functional criteria and insurance authorization.   Follow Up Recommendations  Home health OT     Assistance Recommended at Discharge Frequent or constant Supervision/Assistance  Patient can return home with the following A little help with walking and/or transfers;A little help with bathing/dressing/bathroom;Assistance with cooking/housework;Direct supervision/assist for  medications management;Direct supervision/assist for financial management;Assist for transportation;Help with stairs or ramp for entrance    Functional Status Assessment  Patient has had a recent decline in their functional status and demonstrates the ability to make significant improvements in function in a reasonable and predictable amount of time.  Equipment Recommendations  None recommended by OT    Recommendations for Other Services       Precautions / Restrictions Precautions Precautions: Fall Restrictions Weight Bearing Restrictions: No      Mobility Bed Mobility Overal bed mobility: Modified Independent Bed Mobility: Supine to Sit, Sit to Supine     Supine to sit: Modified independent (Device/Increase time) Sit to supine: Modified independent (Device/Increase time)   General bed mobility comments: extra time to perform, no assistance required    Transfers Overall transfer level: Needs assistance Equipment used: Rolling walker (2 wheels) Transfers: Sit to/from Stand Sit to Stand: Min guard           General transfer comment: intermittent cues for safety; hand placement      Balance Overall balance assessment: Needs assistance Sitting-balance support: Feet supported Sitting balance-Leahy Scale: Fair   Postural control: Posterior lean Standing balance support: Bilateral upper extremity supported, During functional activity Standing balance-Leahy Scale: Fair                             ADL either performed or assessed with clinical judgement   ADL Overall ADL's : Needs assistance/impaired Eating/Feeding: Set up;Sitting   Grooming: Wash/dry face;Sitting;Set up;Supervision/safety           Upper Body Dressing : Minimal assistance;Sitting Upper Body Dressing Details (indicate cue type and reason): don/doff gown Lower Body Dressing:  Minimal assistance;Sitting/lateral leans Lower Body Dressing Details (indicate cue type and reason): OT  assisted with L sock 2/2 difficulty bending knee from previous surgery Toilet Transfer: Min guard;Rolling walker (2 wheels);Cueing for safety;Cueing for sequencing Toilet Transfer Details (indicate cue type and reason): simulated with STS from EOB Toileting- Clothing Manipulation and Hygiene: Minimal assistance;Sit to/from stand       Functional mobility during ADLs: Min guard;Cueing for safety;Cueing for sequencing;Minimal assistance;Rolling walker (2 wheels) (Min guard for safety, Min A for RW management)       Vision Baseline Vision/History: 1 Wears glasses Patient Visual Report: No change from baseline       Perception     Praxis      Pertinent Vitals/Pain Pain Assessment Pain Assessment: No/denies pain     Hand Dominance     Extremity/Trunk Assessment Upper Extremity Assessment Upper Extremity Assessment: Generalized weakness   Lower Extremity Assessment Lower Extremity Assessment: Generalized weakness   Cervical / Trunk Assessment Cervical / Trunk Assessment: Normal   Communication Communication Communication: No difficulties   Cognition Arousal/Alertness: Awake/alert Behavior During Therapy: WFL for tasks assessed/performed Overall Cognitive Status: No family/caregiver present to determine baseline cognitive functioning                                 General Comments: Pt oriented to self, month, and location. Disoriented to day of the week/year. Able to give some details of family (husband's name, where son & grandson lives)     General Comments       Exercises Other Exercises Other Exercises: OT provided education re: role of OT, OT POC, post acute recs, sitting up for all meals, EOB/OOB mobility with assistance, home/fall safety.     Shoulder Instructions      Home Living Family/patient expects to be discharged to:: Private residence Living Arrangements: Spouse/significant other Available Help at Discharge: Family Type of Home:  House Home Access: Stairs to enter Secretary/administrator of Steps: 3 Entrance Stairs-Rails: Right Home Layout: Able to live on main level with bedroom/bathroom;Two level     Bathroom Shower/Tub: Tub/shower unit         Home Equipment: Pharmacist, hospital (2 wheels);Cane - single point   Additional Comments: Information obtained from previous OT eval as pt likely poor historian and unable to provide extensive history.      Prior Functioning/Environment Prior Level of Function : Needs assist             Mobility Comments: amb with no AD; son reports frequent falls ADLs Comments: [facility] comes from 10-8 every day to assist with IADLs per son; pt reports she generally gets dressed on her own        OT Problem List: Decreased strength;Decreased activity tolerance;Impaired balance (sitting and/or standing);Decreased knowledge of use of DME or AE;Decreased safety awareness;Decreased cognition      OT Treatment/Interventions: Therapeutic exercise;Self-care/ADL training;Therapeutic activities;Cognitive remediation/compensation;DME and/or AE instruction;Patient/family education;Balance training    OT Goals(Current goals can be found in the care plan section) Acute Rehab OT Goals Patient Stated Goal: return home OT Goal Formulation: With patient Time For Goal Achievement: 02/27/22 Potential to Achieve Goals: Good   OT Frequency: Min 2X/week    Co-evaluation PT/OT/SLP Co-Evaluation/Treatment: Yes Reason for Co-Treatment: Necessary to address cognition/behavior during functional activity;To address functional/ADL transfers;For patient/therapist safety PT goals addressed during session: Mobility/safety with mobility;Balance;Proper use of DME OT goals addressed during session: ADL's and  self-care;Proper use of Adaptive equipment and DME      AM-PAC OT "6 Clicks" Daily Activity     Outcome Measure Help from another person eating meals?: None Help from  another person taking care of personal grooming?: A Little Help from another person toileting, which includes using toliet, bedpan, or urinal?: A Little Help from another person bathing (including washing, rinsing, drying)?: A Little Help from another person to put on and taking off regular upper body clothing?: A Little Help from another person to put on and taking off regular lower body clothing?: A Little 6 Click Score: 19   End of Session Equipment Utilized During Treatment: Gait belt;Rolling walker (2 wheels) Nurse Communication: Mobility status  Activity Tolerance: Patient tolerated treatment well Patient left: in bed;with call bell/phone within reach;with bed alarm set  OT Visit Diagnosis: Unsteadiness on feet (R26.81);Muscle weakness (generalized) (M62.81);History of falling (Z91.81)                Time: 4818-5909 OT Time Calculation (min): 25 min Charges:  OT General Charges $OT Visit: 1 Visit OT Evaluation $OT Eval Moderate Complexity: 1 Mod  [facility] MS, OTR/L ascom [phone]  02/13/22, 10:15 AM

## 2022-02-14 DIAGNOSIS — A419 Sepsis, unspecified organism: Secondary | ICD-10-CM | POA: Diagnosis not present

## 2022-02-14 DIAGNOSIS — I2489 Other forms of acute ischemic heart disease: Secondary | ICD-10-CM | POA: Diagnosis not present

## 2022-02-14 DIAGNOSIS — N179 Acute kidney failure, unspecified: Secondary | ICD-10-CM | POA: Diagnosis not present

## 2022-02-14 DIAGNOSIS — E875 Hyperkalemia: Secondary | ICD-10-CM | POA: Diagnosis not present

## 2022-02-14 LAB — RENAL FUNCTION PANEL
Albumin: 2.9 g/dL — ABNORMAL LOW (ref 3.5–5.0)
Anion gap: 11 (ref 5–15)
BUN: 56 mg/dL — ABNORMAL HIGH (ref 8–23)
CO2: 22 mmol/L (ref 22–32)
Calcium: 8.1 mg/dL — ABNORMAL LOW (ref 8.9–10.3)
Chloride: 107 mmol/L (ref 98–111)
Creatinine, Ser: 2.84 mg/dL — ABNORMAL HIGH (ref 0.44–1.00)
GFR, Estimated: 16 mL/min — ABNORMAL LOW (ref >60)
Glucose, Bld: 201 mg/dL — ABNORMAL HIGH (ref 70–99)
Phosphorus: 3.7 mg/dL (ref 2.5–4.6)
Potassium: 3.4 mmol/L — ABNORMAL LOW (ref 3.5–5.1)
Sodium: 140 mmol/L (ref 135–145)

## 2022-02-14 LAB — CBC WITH DIFFERENTIAL/PLATELET
Abs Immature Granulocytes: 0.03 10*3/uL (ref 0.00–0.07)
Basophils Absolute: 0 10*3/uL (ref 0.0–0.1)
Basophils Relative: 0 %
Eosinophils Absolute: 0.2 10*3/uL (ref 0.0–0.5)
Eosinophils Relative: 2 %
HCT: 32.2 % — ABNORMAL LOW (ref 36.0–46.0)
Hemoglobin: 10.6 g/dL — ABNORMAL LOW (ref 12.0–15.0)
Immature Granulocytes: 0 %
Lymphocytes Relative: 14 %
Lymphs Abs: 1.3 10*3/uL (ref 0.7–4.0)
MCH: 31.3 pg (ref 26.0–34.0)
MCHC: 32.9 g/dL (ref 30.0–36.0)
MCV: 95 fL (ref 80.0–100.0)
Monocytes Absolute: 1.6 10*3/uL — ABNORMAL HIGH (ref 0.1–1.0)
Monocytes Relative: 18 %
Neutro Abs: 6.2 10*3/uL (ref 1.7–7.7)
Neutrophils Relative %: 66 %
Platelets: 254 10*3/uL (ref 150–400)
RBC: 3.39 MIL/uL — ABNORMAL LOW (ref 3.87–5.11)
RDW: 13.5 % (ref 11.5–15.5)
WBC: 9.4 10*3/uL (ref 4.0–10.5)
nRBC: 0 % (ref 0.0–0.2)

## 2022-02-14 LAB — GLUCOSE, CAPILLARY
Glucose-Capillary: 189 mg/dL — ABNORMAL HIGH (ref 70–99)
Glucose-Capillary: 205 mg/dL — ABNORMAL HIGH (ref 70–99)
Glucose-Capillary: 163 mg/dL — ABNORMAL HIGH (ref 70–99)
Glucose-Capillary: 241 mg/dL — ABNORMAL HIGH (ref 70–99)

## 2022-02-14 LAB — MAGNESIUM: Magnesium: 1.6 mg/dL — ABNORMAL LOW (ref 1.7–2.4)

## 2022-02-14 MED ORDER — POTASSIUM CHLORIDE CRYS ER 20 MEQ PO TBCR
20.0000 meq | EXTENDED_RELEASE_TABLET | Freq: Once | ORAL | Status: AC
  Administered 2022-02-14: 20 meq via ORAL
  Filled 2022-02-14: qty 1

## 2022-02-14 MED ORDER — CEFADROXIL 500 MG PO CAPS: 1000.0000 mg | ORAL_CAPSULE | Freq: Two times a day (BID) | ORAL | Status: DC

## 2022-02-14 MED ORDER — CEFADROXIL 500 MG PO CAPS
500.0000 mg | ORAL_CAPSULE | Freq: Every day | ORAL | Status: DC
  Administered 2022-02-14 – 2022-02-16 (×3): 500 mg via ORAL
  Filled 2022-02-14 (×3): qty 1

## 2022-02-14 MED ORDER — AMLODIPINE BESYLATE 10 MG PO TABS
10.0000 mg | ORAL_TABLET | Freq: Every day | ORAL | Status: DC
  Administered 2022-02-15 – 2022-02-16 (×2): 10 mg via ORAL
  Filled 2022-02-14 (×2): qty 1

## 2022-02-14 MED ORDER — AMLODIPINE BESYLATE 5 MG PO TABS
5.0000 mg | ORAL_TABLET | Freq: Every day | ORAL | Status: DC
  Administered 2022-02-14: 5 mg via ORAL
  Filled 2022-02-14: qty 1

## 2022-02-14 MED ORDER — MAGNESIUM SULFATE 4 GM/100ML IV SOLN
4.0000 g | Freq: Once | INTRAVENOUS | Status: AC
  Administered 2022-02-14: 4 g via INTRAVENOUS
  Filled 2022-02-14: qty 100

## 2022-02-14 MED ORDER — AMLODIPINE BESYLATE 5 MG PO TABS
5.0000 mg | ORAL_TABLET | Freq: Once | ORAL | Status: AC
  Administered 2022-02-14: 5 mg via ORAL
  Filled 2022-02-14: qty 1

## 2022-02-14 NOTE — Plan of Care (Signed)
  Problem: Education: Goal: Knowledge of patient specific risk factors will improve Paula Hansen N/A or DELETE if not current risk factor) Outcome: Progressing   Problem: Ischemic Stroke/TIA Tissue Perfusion: Goal: Complications of ischemic stroke/TIA will be minimized Outcome: Progressing   Problem: Coping: Goal: Will identify appropriate support needs Outcome: Progressing   Problem: Nutrition: Goal: Dietary intake will improve Outcome: Progressing   Problem: Clinical Measurements: Goal: Cardiovascular complication will be avoided Outcome: Progressing   Problem: Activity: Goal: Risk for activity intolerance will decrease Outcome: Progressing   Problem: Education: Goal: Ability to describe self-care measures that may prevent or decrease complications (Diabetes Survival Skills Education) will improve Outcome: Progressing

## 2022-02-14 NOTE — Progress Notes (Addendum)
PROGRESS NOTE    Paula Hansen  XLK:[id number] DOB: 1934-10-09 DOA: 02/10/2022 PCP: Gracelyn Nurse, MD    Chief Complaint  Patient presents with   Hypoglycemia    Brief Narrative:  Patient 87 year old female history of GERD, hypertension hyperlipidemia OA hypothyroidism CVA, DM2, neuropathy, CKD, subclavian steal syndrome presented to the ED on admission with altered mental status.  Patient on arrival noted to be in altered mental status, noted to have blood glucose in the 40s received D5W with some improvement in responsiveness.  Course in the ED and workup concerning for acute renal failure creatinine of 8.43, hyperkalemic, noted to be septic felt secondary to UTI, COVID-19 PCR negative, noted to have elevated troponins concerning for non-STEMI versus demand ischemia.  Patient initially placed on broad-spectrum IV antibiotics, pancultured, placed on IV fluids, bicarb drip and due to high risk of decompensation PCCM consulted.  Patient subsequently admitted to the ICU, noted to have some concerns of acute hypoxic respiratory failure felt initially secondary to pulmonary edema placed on high flow nasal cannula, nephrology consulted.  PCCM discussed with family and initial decision with goals of care was to transition to full comfort measures.  Patient subsequently transferred to the floor and transferred to the hospitalist team.  Patient seem to improved clinically, more alert and assessed by palliative care.  Family decided to treat reversible illnesses at this time with further evaluation by palliative care and PT OT.   Assessment & Plan:   Principal Problem:   Sepsis (HCC) Active Problems:   Hyperkalemia   AKI (acute kidney injury) (HCC)   Demand ischemia   Acute encephalopathy   Type II diabetes mellitus (HCC)   Hypothyroidism   Essential hypertension   Sepsis secondary to UTI (HCC)   Acute renal failure (HCC)  #1 septic shock secondary to UTI, POA -Patient admitted with altered  mental status, noted to be hypothermic on admission with leukocytosis, elevated lactic acid level, urinalysis consistent with UTI. -CT abdomen and pelvis done on admission concerning for mild colitis however no noted GI symptoms on presentation. -Chest x-ray done initially reassuring. -Patient noted to decline, with septic shock, noted to be with multiorgan failure with acute renal failure, hyperkalemia, lactic acidosis, concern for respiratory distress/hypoxia, critical care medicine discussed with family initially patient was made comfort. -Patient has improved clinically, alert, following commands, improvement with blood pressure. -Urine cultures with > 100,000 colonies of Klebsiella pneumonia.  Blood cultures with no growth to date. -COVID-19 PCR negative, influenza A and B PCR negative. -Patient more alert today, somewhat clinically improved. -Case discussed again with family who initially felt in discussion with critical care initially on presentation that patient would have deteriorated quickly however with clinical improvement I in agreement for treatment of reversible causes. -Repeat BMET today with a creatinine of 2.84 from 5.24 which is in line with slow recovery of renal function from admission. -CBC with improvement with leukocytosis.  -Continue gentle hydration with IV fluids, antibiotics. -Supportive care. -Palliative care consulted for goals of care and reassessment.  2.  Acute renal failure/hyperkalemia -Patient had presented with acute renal failure on admission with a creatinine as high as 8.43 on day of admission with severe hyperkalemia with potassium of 7.4. -Felt likely secondary to prerenal azotemia in the setting of septic shock and sepsis. -Patient seen by nephrology on admission, PCCM discussed with family who felt patient would not be interested in hemodialysis. -Due to patient's acute deterioration and presentation PCCM discussed with family and initially decision  was  made to transition to full comfort measures. -Patient transferred to floor, has improved clinically, repeat comprehensive metabolic profile done on [id number] noted a creatinine of 0.69 likely an outlier as repeat BME done 02/13/2022 with a creatinine of 5.24 which is in line of slow improvement of renal function with hydration as to be expected. -Renal function continues to improve creatinine down to 2.84. -Urine sodium 126, urine creatinine 33. -Patient with urine output of 3.5 L over the past 24 hours. -Family at this time interested in treating the reversible things. -Family not interested in hemodialysis. -Continue IV fluids, treat UTI with empiric antibiotics. -Monitor urine output. -Repeat labs in the AM. -Supportive care.  3.  Non-STEMI/demand ischemia -Patient noted to have a significantly elevated troponin of 533>>> 1036>> 1913>> 2518. -Patient initially followed by PCCM, due to patient's presentation and in discussions with family patient initially made comfort measures. -Patient improved clinically. -Patient denies any chest pain. -EKG done on admission with no significant ischemic changes noted. -Repeat 2D echo with a EF > 75%,  NWMA, grade 2 diastolic dysfunction, mildly dilated left atrial size, mildly dilated right atrial size, mild MVR, trivial AVR.  -Continue home regimen aspirin, Plavix, statin. -Supportive care.  4.  Acute hypoxemic respiratory failure -Patient on admission noted to go into acute hypoxemic respiratory failure in the setting of renal failure, concern initially for pulmonary edema noted to be on high flow nasal cannula. -PCCM initially discussed case with family initially patient transitioned to full comfort measures. -Patient with clinical improvement currently with sats of 94% on room air.   -Continue gentle hydration with IV fluid.    5.  Acute metabolic encephalopathy -Likely secondary to sepsis and uremic encephalopathy. -Renal function seems to  have improved with repeat labs today. -Patient currently afebrile, improved clinically. -Blood cultures pending with no growth to date. -Urine cultures with > 100,000 colonies of Klebsiella  -Was on IV Rocephin with transition to cefadroxil to complete course of antibiotic treatment.   -Supportive care.  6.  Diabetes mellitus type 2 -Hemoglobin A1c 7.2 (02/06/2022) -CBG 189 this morning.  -Continue to hold home regimen oral hypoglycemic agents. -Continue Semglee 5 units daily.   -SSI.  7.  Hypothyroidism -Continue Synthroid.    8.  Hypertension -Patient noted to have been on amlodipine benazepril, prior to admission.   -Due to acute renal failure benazepril discontinued and likely will not resume on discharge.   -Start amlodipine 5 mg daily.   9.  Recent TIA -Patient noted to have a TIA during recent hospitalization 02/07/2022. -Patient noted to have been discharged on Plavix and aspirin which have been resumed for secondary stroke prophylaxis.  -Continue statin.   10.  GERD -PPI.    11.  Prognosis -Initially on admission patient was in multiorgan failure, noted to be deteriorating, seen by PCCM who discussed with family goals of care discussion was obtained and patient was transitioned to full comfort measures. -Patient transferred to the floor, patient however has improved clinically with improvement with renal function, electrolytes more alert and following commands appropriately. -Palliative care consulted to rediscuss goals of care. -Will likely need outpatient follow-up with palliative care on discharge.   DVT prophylaxis: Lovenox Code Status: DNR Family Communication: Updated son on telephone. Disposition: TBD  Status is: Inpatient Remains inpatient appropriate because: Severity of illness/unsafe disposition   Consultants:  PCCM: Dr.Dgayli 02/10/2022 Nephrology: Dr. Candiss Norse 02/11/2022 Palliative care: Quinn Axe 02/12/2022  Procedures:  CT head 02/10/2022 CT  abdomen and pelvis  02/10/2022 Chest x-ray 02/10/2022   Antimicrobials:  IV Rocephin 02/12/2022>>> 02/14/2022 IV cefepime 12/20/ 2023 x 1 dose Cefadroxil 02/14/2022>>> 02/18/2022   Subjective: Sleeping but arousable.  Alert to self only.  Denies any chest pain, no shortness of breath, no abdominal pain.  Overall feeling better.  States she is tolerating oral intake.  Noted to have good urinary output.    Objective: Vitals:   02/13/22 2104 02/14/22 0524 02/14/22 0846 02/14/22 1502  BP: (!) 167/76 (!) 181/83 (!) 173/86 (!) 187/91  Pulse: 87 85 81 91  Resp: 20 16 17 17   Temp: 99.2 F (37.3 C) 98.3 F (36.8 C) 98 F (36.7 C) 97.8 F (36.6 C)  TempSrc:      SpO2: 96% 90% (!) 89% 94%  Weight:      Height:        Intake/Output Summary (Last 24 hours) at 02/14/2022 1743 Last data filed at 02/14/2022 1515 Gross per 24 hour  Intake 2296.33 ml  Output 3000 ml  Net -703.67 ml    Filed Weights   02/10/22 1352  Weight: 70.3 kg    Examination:  General exam: Alert.  Dry mucous membranes.  Respiratory system: Lungs clear to auscultation bilaterally.  No wheezes, no crackles, no rhonchi.  Fair air movement.  Speaking in full sentences.   Cardiovascular system: RRR no murmurs rubs or gallops.  No JVD.  No lower extremity edema.  Gastrointestinal system: Abdomen is soft, nontender, nondistended, positive bowel sounds.  No rebound.  No guarding.  Central nervous system: Alert and oriented x 2. No focal neurological deficits.  Moving extremities spontaneously. Extremities: Symmetric 5 x 5 power. Skin: No rashes, lesions or ulcers Psychiatry: Judgement and insight appear normal. Mood & affect appropriate.     Data Reviewed: I have personally reviewed following labs and imaging studies  CBC: Recent Labs  Lab 02/10/22 1515 02/11/22 0500 02/12/22 1337 02/13/22 0510 02/14/22 0553  WBC 17.6* 16.9* 11.8* 9.0 9.4  NEUTROABS  --  11.9* 8.8* 6.0 6.2  HGB 10.4* 8.1* 10.2* 10.1*  10.6*  HCT 34.2* 25.5* 30.4* 30.2* 32.2*  MCV 104.6* 100.0 94.4 94.7 95.0  PLT 289 269 251 244 254     Basic Metabolic Panel: Recent Labs  Lab 02/10/22 2322 02/11/22 0753 02/11/22 0754 02/12/22 1313 02/13/22 0510 02/14/22 0553  NA 142  --  141 141 142 140  K 5.9*  --  5.8* 4.1 4.0 3.4*  CL 109  --  106 109 107 107  CO2 15*  --  16* 26 22 22   GLUCOSE 73  --  92 125* 226* 201*  BUN 100*  --  100* 12 89* 56*  CREATININE 7.56*  --  7.54* 0.69 5.24* 2.84*  CALCIUM 7.6*  --  7.5* 8.3* 7.7* 8.1*  MG  --  1.7  --  2.1 1.7 1.6*  PHOS  --   --   --   --  5.9* 3.7     GFR: Estimated Creatinine Clearance: 13.6 mL/min (A) (by C-G formula based on SCr of 2.84 mg/dL (H)).  Liver Function Tests: Recent Labs  Lab 02/11/22 0754 02/12/22 1313 02/14/22 0553  AST 42* 50*  --   ALT 21 15  --   ALKPHOS 49 63  --   BILITOT 0.7 1.0  --   PROT 5.7* 6.4*  --   ALBUMIN 3.0* 2.8* 2.9*     CBG: Recent Labs  Lab 02/13/22 1750 02/13/22 2306 02/14/22 0750 02/14/22 1135 02/14/22 1633  GLUCAP 344* 220* 189* 205* 163*      Recent Results (from the past 240 hour(s))  Resp panel by RT-PCR (RSV, Flu A&B, Covid) Anterior Nasal Swab     Status: None   Collection Time: 02/10/22  3:13 PM   Specimen: Anterior Nasal Swab  Result Value Ref Range Status   SARS Coronavirus 2 by RT PCR NEGATIVE NEGATIVE Final    Comment: (NOTE) SARS-CoV-2 target nucleic acids are NOT DETECTED.  The SARS-CoV-2 RNA is generally detectable in upper respiratory specimens during the acute phase of infection. The lowest concentration of SARS-CoV-2 viral copies this assay can detect is 138 copies/mL. A negative result does not preclude SARS-Cov-2 infection and should not be used as the sole basis for treatment or other patient management decisions. A negative result may occur with  improper specimen collection/handling, submission of specimen other than nasopharyngeal swab, presence of viral mutation(s) within  the areas targeted by this assay, and inadequate number of viral copies(<138 copies/mL). A negative result must be combined with clinical observations, patient history, and epidemiological information. The expected result is Negative.  Fact Sheet for Patients:  EntrepreneurPulse.com.au  Fact Sheet for Healthcare Providers:  IncredibleEmployment.be  This test is no t yet approved or cleared by the [place] FDA and  has been authorized for detection and/or diagnosis of SARS-CoV-2 by FDA under an Emergency Use Authorization (EUA). This EUA will remain  in effect (meaning this test can be used) for the duration of the COVID-19 declaration under Section 564(b)(1) of the Act, 21 U.S.C.section 360bbb-3(b)(1), unless the authorization is terminated  or revoked sooner.       Influenza A by PCR NEGATIVE NEGATIVE Final   Influenza B by PCR NEGATIVE NEGATIVE Final    Comment: (NOTE) The Xpert Xpress SARS-CoV-2/FLU/RSV plus assay is intended as an aid in the diagnosis of influenza from Nasopharyngeal swab specimens and should not be used as a sole basis for treatment. Nasal washings and aspirates are unacceptable for Xpert Xpress SARS-CoV-2/FLU/RSV testing.  Fact Sheet for Patients: EntrepreneurPulse.com.au  Fact Sheet for Healthcare Providers: IncredibleEmployment.be  This test is not yet approved or cleared by the [place] FDA and has been authorized for detection and/or diagnosis of SARS-CoV-2 by FDA under an Emergency Use Authorization (EUA). This EUA will remain in effect (meaning this test can be used) for the duration of the COVID-19 declaration under Section 564(b)(1) of the Act, 21 U.S.C. section 360bbb-3(b)(1), unless the authorization is terminated or revoked.     Resp Syncytial Virus by PCR NEGATIVE NEGATIVE Final    Comment: (NOTE) Fact Sheet for  Patients: EntrepreneurPulse.com.au  Fact Sheet for Healthcare Providers: IncredibleEmployment.be  This test is not yet approved or cleared by the [place] FDA and has been authorized for detection and/or diagnosis of SARS-CoV-2 by FDA under an Emergency Use Authorization (EUA). This EUA will remain in effect (meaning this test can be used) for the duration of the COVID-19 declaration under Section 564(b)(1) of the Act, 21 U.S.C. section 360bbb-3(b)(1), unless the authorization is terminated or revoked.  Performed at [facility] [address] [place], [place] [address]   Blood culture (routine x 2)     Status: None (Preliminary result)   Collection Time: 02/10/22  3:13 PM   Specimen: BLOOD  Result Value Ref Range Status   Specimen Description BLOOD BLOOD LEFT ARM  Final   Special Requests   Final    BOTTLES DRAWN AEROBIC AND ANAEROBIC Blood Culture adequate volume  Culture   Final    NO GROWTH 4 DAYS Performed at [facility], [place], [place] [address]    Report Status PENDING  Incomplete  Blood culture (routine x 2)     Status: None (Preliminary result)   Collection Time: 02/10/22  3:17 PM   Specimen: BLOOD  Result Value Ref Range Status   Specimen Description BLOOD BLOOD RIGHT HAND  Final   Special Requests   Final    BOTTLES DRAWN AEROBIC AND ANAEROBIC Blood Culture adequate volume   Culture   Final    NO GROWTH 4 DAYS Performed at [facility], [address] [place], [place] [address]    Report Status PENDING  Incomplete  Urine Culture     Status: Abnormal   Collection Time: 02/10/22  6:25 PM   Specimen: Urine, Random  Result Value Ref Range Status   Specimen Description   Final    URINE, RANDOM Performed at [facility], [address] [place], [place] [address]    Special Requests   Final    NONE Performed at [facility], [address]  [place], [place] [address]    Culture >=100,000 COLONIES/mL KLEBSIELLA PNEUMONIAE (A)  Final   Report Status 02/13/2022 FINAL  Final   Organism ID, Bacteria KLEBSIELLA PNEUMONIAE (A)  Final      Susceptibility   Klebsiella pneumoniae - MIC*    AMPICILLIN >=32 RESISTANT Resistant     CEFAZOLIN <=4 SENSITIVE Sensitive     CEFEPIME <=0.12 SENSITIVE Sensitive     CEFTRIAXONE <=0.25 SENSITIVE Sensitive     CIPROFLOXACIN <=0.25 SENSITIVE Sensitive     GENTAMICIN <=1 SENSITIVE Sensitive     IMIPENEM 0.5 SENSITIVE Sensitive     NITROFURANTOIN 64 INTERMEDIATE Intermediate     TRIMETH/SULFA <=20 SENSITIVE Sensitive     AMPICILLIN/SULBACTAM 4 SENSITIVE Sensitive     PIP/TAZO <=4 SENSITIVE Sensitive     * >=100,000 COLONIES/mL KLEBSIELLA PNEUMONIAE  MRSA Next Gen by PCR, Nasal     Status: Abnormal   Collection Time: 02/11/22  1:45 AM   Specimen: Nasal Mucosa; Nasal Swab  Result Value Ref Range Status   MRSA by PCR Next Gen DETECTED (A) NOT DETECTED Final    Comment: RESULT CALLED TO, READ BACK BY AND VERIFIED WITH: NALLELY SOSA 02/11/22 0503 MW (NOTE) The GeneXpert MRSA Assay (FDA approved for NASAL specimens only), is one component of a comprehensive MRSA colonization surveillance program. It is not intended to diagnose MRSA infection nor to guide or monitor treatment for MRSA infections. Test performance is not FDA approved in patients less than 47 years old. Performed at [facility], [address], [place], [place] [address]          Radiology Studies: ECHOCARDIOGRAM COMPLETE  Result Date: 02/13/2022    ECHOCARDIOGRAM REPORT   Patient Name:   MERRIANN FOGEL Helt Date of Exam: 02/13/2022 Medical Rec #:  [id number]    Height:       63.0 in Accession #:    [id number]   Weight:       155.0 lb Date of Birth:  07/29/1934    BSA:          1.735 m Patient Age:    41 years     BP:           178/69 mmHg Patient Gender: F            HR:  93 bpm. Exam Location:  [facility] Procedure: 2D  Echo, Cardiac Doppler and Color Doppler Indications:    Elevated troponin  History:        Patient has prior history of Echocardiogram examinations, most                 recent 02/06/2022. Risk Factors:Hypertension and Diabetes.  Sonographer:    Eartha Inch Referring Phys: [id number] Noeh Sparacino V Shavonne Ambroise  Sonographer Comments: Image acquisition challenging due to respiratory motion. IMPRESSIONS  1. Left ventricular ejection fraction, by estimation, is >75%. The left ventricle has hyperdynamic function. The left ventricle has no regional wall motion abnormalities. Left ventricular diastolic parameters are consistent with Grade II diastolic dysfunction (pseudonormalization).  2. Right ventricular systolic function is normal. The right ventricular size is normal.  3. Left atrial size was mildly dilated.  4. Right atrial size was mildly dilated.  5. The mitral valve is normal in structure. Mild mitral valve regurgitation. No evidence of mitral stenosis.  6. The aortic valve is calcified. Aortic valve regurgitation is trivial. Aortic valve sclerosis/calcification is present, without any evidence of aortic stenosis.  7. The inferior vena cava is normal in size with greater than 50% respiratory variability, suggesting right atrial pressure of 3 mmHg. FINDINGS  Left Ventricle: Left ventricular ejection fraction, by estimation, is >75%. The left ventricle has hyperdynamic function. The left ventricle has no regional wall motion abnormalities. The left ventricular internal cavity size was normal in size. There is no left ventricular hypertrophy. Left ventricular diastolic parameters are consistent with Grade II diastolic dysfunction (pseudonormalization). Right Ventricle: The right ventricular size is normal. No increase in right ventricular wall thickness. Right ventricular systolic function is normal. Left Atrium: Left atrial size was mildly dilated. Right Atrium: Right atrial size was mildly dilated. Pericardium: There is no  evidence of pericardial effusion. Mitral Valve: The mitral valve is normal in structure. Mild mitral valve regurgitation. No evidence of mitral valve stenosis. Tricuspid Valve: The tricuspid valve is normal in structure. Tricuspid valve regurgitation is mild . No evidence of tricuspid stenosis. Aortic Valve: The aortic valve is calcified. Aortic valve regurgitation is trivial. Aortic valve sclerosis/calcification is present, without any evidence of aortic stenosis. Aortic valve mean gradient measures 6.0 mmHg. Aortic valve peak gradient measures 10.4 mmHg. Aortic valve area, by VTI measures 2.63 cm. Pulmonic Valve: The pulmonic valve was normal in structure. Pulmonic valve regurgitation is not visualized. No evidence of pulmonic stenosis. Aorta: The aortic root is normal in size and structure. Venous: The inferior vena cava is normal in size with greater than 50% respiratory variability, suggesting right atrial pressure of 3 mmHg. IAS/Shunts: No atrial level shunt detected by color flow Doppler.  LEFT VENTRICLE PLAX 2D LVIDd:         3.40 cm     Diastology LVIDs:         2.50 cm     LV e' medial:    10.80 cm/s LV PW:         1.20 cm     LV E/e' medial:  14.6 LV IVS:        0.90 cm     LV e' lateral:   9.36 cm/s LVOT diam:     2.20 cm     LV E/e' lateral: 16.8 LV SV:         81 LV SV Index:   46 LVOT Area:     3.80 cm  LV Volumes (MOD) LV vol d, MOD A2C: 76.6  ml LV vol d, MOD A4C: 92.5 ml LV vol s, MOD A2C: 26.4 ml LV vol s, MOD A4C: 33.1 ml LV SV MOD A2C:     50.2 ml LV SV MOD A4C:     92.5 ml LV SV MOD BP:      54.0 ml RIGHT VENTRICLE             IVC RV S prime:     15.30 cm/s  IVC diam: 2.20 cm TAPSE (M-mode): 2.3 cm LEFT ATRIUM             Index        RIGHT ATRIUM           Index LA diam:        3.40 cm 1.96 cm/m   RA Area:     10.20 cm LA Vol (A2C):   48.3 ml 27.84 ml/m  RA Volume:   21.00 ml  12.10 ml/m LA Vol (A4C):   34.1 ml 19.65 ml/m LA Biplane Vol: 41.8 ml 24.09 ml/m  AORTIC VALVE AV Area (Vmax):     2.34 cm AV Area (Vmean):   2.37 cm AV Area (VTI):     2.63 cm AV Vmax:           161.00 cm/s AV Vmean:          117.000 cm/s AV VTI:            0.307 m AV Peak Grad:      10.4 mmHg AV Mean Grad:      6.0 mmHg LVOT Vmax:         99.20 cm/s LVOT Vmean:        72.800 cm/s LVOT VTI:          0.212 m LVOT/AV VTI ratio: 0.69  AORTA Ao Root diam: 2.80 cm Ao Asc diam:  3.50 cm MITRAL VALVE MV Area (PHT): 4.56 cm     SHUNTS MV Decel Time: 167 msec     Systemic VTI:  0.21 m MV E velocity: 157.50 cm/s  Systemic Diam: 2.20 cm Neoma Laming Electronically signed by Neoma Laming Signature Date/Time: 02/13/2022/3:58:14 PM    Final    US RENAL  Result Date: 02/13/2022 CLINICAL DATA:  Renal dysfunction EXAM: RENAL / URINARY TRACT ULTRASOUND COMPLETE COMPARISON:  CT done on 02/10/2022 FINDINGS: Right Kidney: Renal measurements: 9.7 x 5 x 4.4 cm = volume: 110.9 mL. There is no hydronephrosis. There is slightly increased cortical echogenicity. There is no focal cortical thinning or perinephric fluid collection. Left Kidney: Renal measurements: 9.7 x 4.6 x 4.3 cm = volume: 100.3 mL. There is no hydronephrosis. There is no perinephric fluid collection. There is 1.2 by 2.3 x 1.8 cm cyst in the upper pole. There is 1.2 x 1 x 1.3 cm cyst in the midportion. Bladder: Appears normal for degree of bladder distention. Other: None. IMPRESSION: There is no hydronephrosis.  Left renal cysts. Electronically Signed   By: Elmer Picker M.D.   On: 02/13/2022 13:22        Scheduled Meds:  amLODipine  5 mg Oral Daily   aspirin EC  81 mg Oral QODAY   cefadroxil  500 mg Oral Daily   Chlorhexidine Gluconate Cloth  6 each Topical Daily   clopidogrel  75 mg Oral Daily   heparin injection (subcutaneous)  5,000 Units Subcutaneous Q8H   insulin aspart  0-6 Units Subcutaneous TID WC   insulin glargine-yfgn  5 Units Subcutaneous Daily   levothyroxine  50 mcg Oral Q0600   loratadine  10 mg Oral Daily   oseltamivir  30 mg Oral QODAY    pantoprazole  40 mg Oral Daily   rosuvastatin  10 mg Oral Daily   sodium chloride flush  3 mL Intravenous Q12H   Continuous Infusions:  sodium chloride Stopped (02/12/22 1900)   sodium chloride     sodium chloride 100 mL/hr at 02/14/22 1515     LOS: 4 days    Time spent: 35 minutes    Irine Seal, MD [facility]   To contact the attending provider between 7A-7P or the covering provider during after hours 7P-7A, please log into the web site [email or web address] and access using universal [place] password for that web site. If you do not have the password, please call the hospital operator.  02/14/2022, 5:43 PM

## 2022-02-15 DIAGNOSIS — N179 Acute kidney failure, unspecified: Secondary | ICD-10-CM | POA: Diagnosis not present

## 2022-02-15 DIAGNOSIS — A419 Sepsis, unspecified organism: Secondary | ICD-10-CM | POA: Diagnosis not present

## 2022-02-15 DIAGNOSIS — E875 Hyperkalemia: Secondary | ICD-10-CM | POA: Diagnosis not present

## 2022-02-15 DIAGNOSIS — I2489 Other forms of acute ischemic heart disease: Secondary | ICD-10-CM | POA: Diagnosis not present

## 2022-02-15 LAB — RENAL FUNCTION PANEL
Albumin: 2.8 g/dL — ABNORMAL LOW (ref 3.5–5.0)
Anion gap: 8 (ref 5–15)
BUN: 35 mg/dL — ABNORMAL HIGH (ref 8–23)
CO2: 24 mmol/L (ref 22–32)
Calcium: 8.6 mg/dL — ABNORMAL LOW (ref 8.9–10.3)
Chloride: 109 mmol/L (ref 98–111)
Creatinine, Ser: 1.63 mg/dL — ABNORMAL HIGH (ref 0.44–1.00)
GFR, Estimated: 31 mL/min — ABNORMAL LOW (ref >60)
Glucose, Bld: 145 mg/dL — ABNORMAL HIGH (ref 70–99)
Phosphorus: 3.6 mg/dL (ref 2.5–4.6)
Potassium: 3.4 mmol/L — ABNORMAL LOW (ref 3.5–5.1)
Sodium: 141 mmol/L (ref 135–145)

## 2022-02-15 LAB — CBC
HCT: 34.7 % — ABNORMAL LOW (ref 36.0–46.0)
Hemoglobin: 11.5 g/dL — ABNORMAL LOW (ref 12.0–15.0)
MCH: 31.9 pg (ref 26.0–34.0)
MCHC: 33.1 g/dL (ref 30.0–36.0)
MCV: 96.4 fL (ref 80.0–100.0)
Platelets: 294 10*3/uL (ref 150–400)
RBC: 3.6 MIL/uL — ABNORMAL LOW (ref 3.87–5.11)
RDW: 13.2 % (ref 11.5–15.5)
WBC: 8.3 10*3/uL (ref 4.0–10.5)
nRBC: 0 % (ref 0.0–0.2)

## 2022-02-15 LAB — CULTURE, BLOOD (ROUTINE X 2)
Culture: NO GROWTH
Culture: NO GROWTH
Special Requests: ADEQUATE
Special Requests: ADEQUATE

## 2022-02-15 LAB — GLUCOSE, CAPILLARY
Glucose-Capillary: 150 mg/dL — ABNORMAL HIGH (ref 70–99)
Glucose-Capillary: 145 mg/dL — ABNORMAL HIGH (ref 70–99)
Glucose-Capillary: 203 mg/dL — ABNORMAL HIGH (ref 70–99)
Glucose-Capillary: 283 mg/dL — ABNORMAL HIGH (ref 70–99)

## 2022-02-15 LAB — MAGNESIUM: Magnesium: 2.2 mg/dL (ref 1.7–2.4)

## 2022-02-15 MED ORDER — ENSURE ENLIVE PO LIQD
237.0000 mL | Freq: Two times a day (BID) | ORAL | Status: DC
  Administered 2022-02-16: 237 mL via ORAL

## 2022-02-15 MED ORDER — POTASSIUM CHLORIDE CRYS ER 20 MEQ PO TBCR
40.0000 meq | EXTENDED_RELEASE_TABLET | Freq: Once | ORAL | Status: AC
  Administered 2022-02-15: 40 meq via ORAL
  Filled 2022-02-15: qty 2

## 2022-02-15 NOTE — Plan of Care (Signed)
  Problem: Respiratory: Goal: Ability to maintain adequate ventilation will improve Outcome: Progressing   Problem: Elimination: Goal: Will not experience complications related to urinary retention Outcome: Progressing   Problem: Safety: Goal: Ability to remain free from injury will improve Outcome: Progressing   Problem: Skin Integrity: Goal: Risk for impaired skin integrity will decrease Outcome: Progressing

## 2022-02-15 NOTE — Progress Notes (Addendum)
PROGRESS NOTE    Paula Hansen  ZOX:[id number] DOB: February 08, 1935 DOA: 02/10/2022 PCP: Gracelyn Nurse, MD    Chief Complaint  Patient presents with   Hypoglycemia    Brief Narrative:  Patient 87 year old female history of GERD, hypertension hyperlipidemia OA hypothyroidism CVA, DM2, neuropathy, CKD, subclavian steal syndrome presented to the ED on admission with altered mental status.  Patient on arrival noted to be in altered mental status, noted to have blood glucose in the 40s received D5W with some improvement in responsiveness.  Course in the ED and workup concerning for acute renal failure creatinine of 8.43, hyperkalemic, noted to be septic felt secondary to UTI, COVID-19 PCR negative, noted to have elevated troponins concerning for non-STEMI versus demand ischemia.  Patient initially placed on broad-spectrum IV antibiotics, pancultured, placed on IV fluids, bicarb drip and due to high risk of decompensation PCCM consulted.  Patient subsequently admitted to the ICU, noted to have some concerns of acute hypoxic respiratory failure felt initially secondary to pulmonary edema placed on high flow nasal cannula, nephrology consulted.  PCCM discussed with family and initial decision with goals of care was to transition to full comfort measures.  Patient subsequently transferred to the floor and transferred to the hospitalist team.  Patient seem to improved clinically, more alert and assessed by palliative care.  Family decided to treat reversible illnesses at this time with further evaluation by palliative care and PT OT.   Assessment & Plan:   Principal Problem:   Sepsis (HCC) Active Problems:   Hyperkalemia   AKI (acute kidney injury) (HCC)   Demand ischemia   Acute encephalopathy   Type II diabetes mellitus (HCC)   Hypothyroidism   Essential hypertension   Sepsis secondary to UTI (HCC)   Acute renal failure (HCC)  #1 septic shock secondary to UTI, POA -Patient admitted with altered  mental status, noted to be hypothermic on admission with leukocytosis, elevated lactic acid level, urinalysis consistent with UTI. -CT abdomen and pelvis done on admission concerning for mild colitis however no noted GI symptoms on presentation. -Chest x-ray done initially reassuring. -Patient noted to decline, with septic shock, noted to be with multiorgan failure with acute renal failure, hyperkalemia, lactic acidosis, concern for respiratory distress/hypoxia, critical care medicine discussed with family initially patient was made comfort. -Patient has improved clinically, alert, following commands, improvement with blood pressure. -Urine cultures with > 100,000 colonies of Klebsiella pneumonia.  Blood cultures with no growth to date. -COVID-19 PCR negative, influenza A and B PCR negative. -Patient improving daily, more alert, some bouts of intermittent confusion. -Case discussed again with family who initially felt in discussion with critical care initially on presentation that patient would have deteriorated quickly however with clinical improvement I in agreement for treatment of reversible causes. -Repeat BMET today with a creatinine of 1.63 from 2.84 from 5.24 which is in line with slow recovery of renal function from admission. -CBC with improvement with leukocytosis.  -Continue gentle hydration with IV fluids, antibiotics. -Supportive care. -Palliative care consulted for goals of care and reassessment.  2.  Acute renal failure/hyperkalemia -Patient had presented with acute renal failure on admission with a creatinine as high as 8.43 on day of admission with severe hyperkalemia with potassium of 7.4. -Felt likely secondary to prerenal azotemia in the setting of septic shock and sepsis. -Patient seen by nephrology on admission, PCCM discussed with family who felt patient would not be interested in hemodialysis. -Due to patient's acute deterioration and presentation PCCM discussed  with  family and initially decision was made to transition to full comfort measures. -Patient transferred to floor, has improved clinically, repeat comprehensive metabolic profile done on 02/12/2022 noted a creatinine of 0.69 likely an outlier as repeat BMET done 02/13/2022 with a creatinine of 5.24 which is in line of slow improvement of renal function with hydration as to be expected. -Renal function continues to improve creatinine down to 1.63. -Urine sodium 126, urine creatinine 33. -Patient with urine output of 2.5 L over the past 24 hours. -Family at this time interested in treating the reversible things. -Family not interested in hemodialysis. -Continue IV fluids, treat UTI with empiric antibiotics. -Monitor urine output. -Repeat labs in the AM. -Supportive care.  3.  Non-STEMI/demand ischemia -Patient noted to have a significantly elevated troponin of 533>>> 1036>> 1913>> 2518. -Patient initially followed by PCCM, due to patient's presentation and in discussions with family patient initially made comfort measures. -Patient improved clinically. -Patient denies any chest pain. -EKG done on admission with no significant ischemic changes noted. -Repeat 2D echo with a EF > 75%,  NWMA, grade 2 diastolic dysfunction, mildly dilated left atrial size, mildly dilated right atrial size, mild MVR, trivial AVR.  -Continue home regimen aspirin, Plavix, statin. -Outpatient follow-up. -Supportive care.  4.  Acute hypoxemic respiratory failure -Patient on admission noted to go into acute hypoxemic respiratory failure in the setting of renal failure, concern initially for pulmonary edema noted to be on high flow nasal cannula. -PCCM initially discussed case with family initially patient transitioned to full comfort measures. -Patient with clinical improvement currently with sats of 91-94% on room air.   -Gentle hydration..    5.  Acute metabolic encephalopathy -Likely secondary to sepsis and uremic  encephalopathy. -Renal function seems to have improved with repeat labs today. -Patient currently afebrile, improved clinically. -Blood cultures pending with no growth to date. -Urine cultures with > 100,000 colonies of Klebsiella  -Was on IV Rocephin with transitioned to cefadroxil to complete course of antibiotic treatment.   -Supportive care.  6.  Diabetes mellitus type 2 -Hemoglobin A1c 7.2 (02/06/2022) -CBG 150 this morning.  -Continue to hold home regimen oral hypoglycemic agents. -Semglee 5 units daily, SSI.   7.  Hypothyroidism -Synthroid.  8.  Hypertension -Patient noted to have been on amlodipine benazepril, prior to admission.   -Due to acute renal failure benazepril discontinued and likely will not resume on discharge.   -Increase amlodipine to 10 mg daily.   9.  Recent TIA -Patient noted to have a TIA during recent hospitalization 02/07/2022. -Continue Plavix and aspirin for secondary stroke prophylaxis.  -Continue statin.   10.  GERD -PPI.  11.  Hypokalemia -K-Dur 40 mEq p.o. x 1.  12.  Prognosis -Initially on admission patient was in multiorgan failure, noted to be deteriorating, seen by PCCM who discussed with family goals of care discussion was obtained and patient was transitioned to full comfort measures. -Patient transferred to the floor, patient however has improved clinically with improvement with renal function, electrolytes more alert and following commands appropriately. -Palliative care consulted to rediscuss goals of care. -Will likely need outpatient follow-up with palliative care on discharge.   DVT prophylaxis: Lovenox Code Status: DNR Family Communication: Updated son on telephone. Disposition: TBD  Status is: Inpatient Remains inpatient appropriate because: Severity of illness/unsafe disposition   Consultants:  PCCM: Dr.Dgayli 02/10/2022 Nephrology: Dr. Thedore Mins 02/11/2022 Palliative care: Lillia Carmel 02/12/2022  Procedures:  CT head  02/10/2022 CT abdomen and pelvis 02/10/2022 Chest x-ray  02/10/2022   Antimicrobials:  IV Rocephin 02/12/2022>>> 02/14/2022 IV cefepime 12/20/ 2023 x 1 dose Cefadroxil 02/14/2022>>> 02/18/2022   Subjective: Sleeping but arousable.  Denies any chest pain or shortness of breath.  No abdominal pain.  States she is tolerating diet.  Overall feels well.  Good urine output.   Objective: Vitals:   02/14/22 0846 02/14/22 1502 02/15/22 0053 02/15/22 0840  BP: (!) 173/86 (!) 187/91 (!) 160/80 (!) 160/71  Pulse: 81 91 91 91  Resp: Temp: 98 F (36.7 C) 97.8 F (36.6 C) 97.9 F (36.6 C) 97.9 F (36.6 C)  TempSrc:      SpO2: (!) 89% 94% 91% 96%  Weight:      Height:        Intake/Output Summary (Last 24 hours) at 02/15/2022 1114 Last data filed at 02/15/2022 0653 Gross per 24 hour  Intake 2207.13 ml  Output 1800 ml  Net 407.13 ml    Filed Weights   02/10/22 1352  Weight: 70.3 kg    Examination:  General exam: Alert. Respiratory system: CTAB.  No wheezes, no crackles, no rhonchi.  Fair air movement.  Speaking in full sentences.    Cardiovascular system: Regular rate rhythm no murmurs rubs or gallops.  No JVD.  No lower extremity edema. Gastrointestinal system: Abdomen is soft, nontender, nondistended, positive bowel sounds.  No rebound.  No guarding.  Central nervous system: Alert and oriented x 2. No focal neurological deficits.  Moving extremities spontaneously. Extremities: Symmetric 5 x 5 power. Skin: No rashes, lesions or ulcers Psychiatry: Judgement and insight appear normal. Mood & affect appropriate.     Data Reviewed: I have personally reviewed following labs and imaging studies  CBC: Recent Labs  Lab 02/11/22 0500 02/12/22 1337 02/13/22 0510 02/14/22 0553 02/15/22 0609  WBC 16.9* 11.8* 9.0 9.4 8.3  NEUTROABS 11.9* 8.8* 6.0 6.2  --   HGB 8.1* 10.2* 10.1* 10.6* 11.5*  HCT 25.5* 30.4* 30.2* 32.2* 34.7*  MCV 100.0 94.4 94.7 95.0 96.4  PLT 269  251 244 254 294     Basic Metabolic Panel: Recent Labs  Lab 02/11/22 0753 02/11/22 0754 02/12/22 1313 02/13/22 0510 02/14/22 0553 02/15/22 0609  NA  --  141 141 142 140 141  K  --  5.8* 4.1 4.0 3.4* 3.4*  CL  --  106 109 107 107 109  CO2  --  16* GLUCOSE  --  92 125* 226* 201* 145*  BUN  --  100* 12 89* 56* 35*  CREATININE  --  7.54* 0.69 5.24* 2.84* 1.63*  CALCIUM  --  7.5* 8.3* 7.7* 8.1* 8.6*  MG 1.7  --  2.1 1.7 1.6* 2.2  PHOS  --   --   --  5.9* 3.7 3.6     GFR: Estimated Creatinine Clearance: 23.7 mL/min (A) (by C-G formula based on SCr of 1.63 mg/dL (H)).  Liver Function Tests: Recent Labs  Lab 02/11/22 0754 02/12/22 1313 02/14/22 0553 02/15/22 0609  AST 42* 50*  --   --   ALT 21 15  --   --   ALKPHOS 49 63  --   --   BILITOT 0.7 1.0  --   --   PROT 5.7* 6.4*  --   --   ALBUMIN 3.0* 2.8* 2.9* 2.8*     CBG: Recent Labs  Lab 02/14/22 0750 02/14/22 1135 02/14/22 1633 02/14/22 2020 02/15/22 0754  GLUCAP 189* 205* 163*  241* 150*      Recent Results (from the past 240 hour(s))  Resp panel by RT-PCR (RSV, Flu A&B, Covid) Anterior Nasal Swab     Status: None   Collection Time: 02/10/22  3:13 PM   Specimen: Anterior Nasal Swab  Result Value Ref Range Status   SARS Coronavirus 2 by RT PCR NEGATIVE NEGATIVE Final    Comment: (NOTE) SARS-CoV-2 target nucleic acids are NOT DETECTED.  The SARS-CoV-2 RNA is generally detectable in upper respiratory specimens during the acute phase of infection. The lowest concentration of SARS-CoV-2 viral copies this assay can detect is 138 copies/mL. A negative result does not preclude SARS-Cov-2 infection and should not be used as the sole basis for treatment or other patient management decisions. A negative result may occur with  improper specimen collection/handling, submission of specimen other than nasopharyngeal swab, presence of viral mutation(s) within the areas targeted by this assay, and  inadequate number of viral copies(<138 copies/mL). A negative result must be combined with clinical observations, patient history, and epidemiological information. The expected result is Negative.  Fact Sheet for Patients:  BloggerCourse.com  Fact Sheet for Healthcare Providers:  SeriousBroker.it  This test is no t yet approved or cleared by the [place] FDA and  has been authorized for detection and/or diagnosis of SARS-CoV-2 by FDA under an Emergency Use Authorization (EUA). This EUA will remain  in effect (meaning this test can be used) for the duration of the COVID-19 declaration under Section 564(b)(1) of the Act, 21 U.S.C.section 360bbb-3(b)(1), unless the authorization is terminated  or revoked sooner.       Influenza A by PCR NEGATIVE NEGATIVE Final   Influenza B by PCR NEGATIVE NEGATIVE Final    Comment: (NOTE) The Xpert Xpress SARS-CoV-2/FLU/RSV plus assay is intended as an aid in the diagnosis of influenza from Nasopharyngeal swab specimens and should not be used as a sole basis for treatment. Nasal washings and aspirates are unacceptable for Xpert Xpress SARS-CoV-2/FLU/RSV testing.  Fact Sheet for Patients: BloggerCourse.com  Fact Sheet for Healthcare Providers: SeriousBroker.it  This test is not yet approved or cleared by the [place] FDA and has been authorized for detection and/or diagnosis of SARS-CoV-2 by FDA under an Emergency Use Authorization (EUA). This EUA will remain in effect (meaning this test can be used) for the duration of the COVID-19 declaration under Section 564(b)(1) of the Act, 21 U.S.C. section 360bbb-3(b)(1), unless the authorization is terminated or revoked.     Resp Syncytial Virus by PCR NEGATIVE NEGATIVE Final    Comment: (NOTE) Fact Sheet for Patients: BloggerCourse.com  Fact Sheet for Healthcare  Providers: SeriousBroker.it  This test is not yet approved or cleared by the [place] FDA and has been authorized for detection and/or diagnosis of SARS-CoV-2 by FDA under an Emergency Use Authorization (EUA). This EUA will remain in effect (meaning this test can be used) for the duration of the COVID-19 declaration under Section 564(b)(1) of the Act, 21 U.S.C. section 360bbb-3(b)(1), unless the authorization is terminated or revoked.  Performed at [facility], [address] [place], [place] [address]   Blood culture (routine x 2)     Status: None   Collection Time: 02/10/22  3:13 PM   Specimen: BLOOD  Result Value Ref Range Status   Specimen Description BLOOD BLOOD LEFT ARM  Final   Special Requests   Final    BOTTLES DRAWN AEROBIC AND ANAEROBIC Blood Culture adequate volume   Culture   Final  NO GROWTH 5 DAYS Performed at [facility], [address] [place], [place] [address]    Report Status 02/15/2022 FINAL  Final  Blood culture (routine x 2)     Status: None   Collection Time: 02/10/22  3:17 PM   Specimen: BLOOD  Result Value Ref Range Status   Specimen Description BLOOD BLOOD RIGHT HAND  Final   Special Requests   Final    BOTTLES DRAWN AEROBIC AND ANAEROBIC Blood Culture adequate volume   Culture   Final    NO GROWTH 5 DAYS Performed at [facility], [address] [place], [place] [address]    Report Status 02/15/2022 FINAL  Final  Urine Culture     Status: Abnormal   Collection Time: 02/10/22  6:25 PM   Specimen: Urine, Random  Result Value Ref Range Status   Specimen Description   Final    URINE, RANDOM Performed at [facility], [address], [place], [place] [address]    Special Requests   Final    NONE Performed at [facility], [address] [place], [place] [address]    Culture >=100,000 COLONIES/mL KLEBSIELLA PNEUMONIAE (A)  Final   Report Status 02/13/2022  FINAL  Final   Organism ID, Bacteria KLEBSIELLA PNEUMONIAE (A)  Final      Susceptibility   Klebsiella pneumoniae - MIC*    AMPICILLIN >=32 RESISTANT Resistant     CEFAZOLIN <=4 SENSITIVE Sensitive     CEFEPIME <=0.12 SENSITIVE Sensitive     CEFTRIAXONE <=0.25 SENSITIVE Sensitive     CIPROFLOXACIN <=0.25 SENSITIVE Sensitive     GENTAMICIN <=1 SENSITIVE Sensitive     IMIPENEM 0.5 SENSITIVE Sensitive     NITROFURANTOIN 64 INTERMEDIATE Intermediate     TRIMETH/SULFA <=20 SENSITIVE Sensitive     AMPICILLIN/SULBACTAM 4 SENSITIVE Sensitive     PIP/TAZO <=4 SENSITIVE Sensitive     * >=100,000 COLONIES/mL KLEBSIELLA PNEUMONIAE  MRSA Next Gen by PCR, Nasal     Status: Abnormal   Collection Time: 02/11/22  1:45 AM   Specimen: Nasal Mucosa; Nasal Swab  Result Value Ref Range Status   MRSA by PCR Next Gen DETECTED (A) NOT DETECTED Final    Comment: RESULT CALLED TO, READ BACK BY AND VERIFIED WITH: NALLELY SOSA 02/11/22 0503 MW (NOTE) The GeneXpert MRSA Assay (FDA approved for NASAL specimens only), is one component of a comprehensive MRSA colonization surveillance program. It is not intended to diagnose MRSA infection nor to guide or monitor treatment for MRSA infections. Test performance is not FDA approved in patients less than 86 years old. Performed at [facility], [address], [place], [place] [address]          Radiology Studies: ECHOCARDIOGRAM COMPLETE  Result Date: 02/13/2022    ECHOCARDIOGRAM REPORT   Patient Name:   JILIAN WEST Prisk Date of Exam: 02/13/2022 Medical Rec #:  [id number]    Height:       63.0 in Accession #:    [id number]   Weight:       155.0 lb Date of Birth:  [birth year]    BSA:          1.735 m Patient Age:    87 years     BP:           178/69 mmHg Patient Gender: F            HR:           93 bpm. Exam  Location:  [facility] Procedure: 2D Echo, Cardiac Doppler and Color Doppler Indications:    Elevated troponin  History:        Patient has prior history of  Echocardiogram examinations, most                 recent 02/06/2022. Risk Factors:Hypertension and Diabetes.  Sonographer:    Milda Smart Referring Phys: [id number] Curtis Uriarte V Christorpher Hisaw  Sonographer Comments: Image acquisition challenging due to respiratory motion. IMPRESSIONS  1. Left ventricular ejection fraction, by estimation, is >75%. The left ventricle has hyperdynamic function. The left ventricle has no regional wall motion abnormalities. Left ventricular diastolic parameters are consistent with Grade II diastolic dysfunction (pseudonormalization).  2. Right ventricular systolic function is normal. The right ventricular size is normal.  3. Left atrial size was mildly dilated.  4. Right atrial size was mildly dilated.  5. The mitral valve is normal in structure. Mild mitral valve regurgitation. No evidence of mitral stenosis.  6. The aortic valve is calcified. Aortic valve regurgitation is trivial. Aortic valve sclerosis/calcification is present, without any evidence of aortic stenosis.  7. The inferior vena cava is normal in size with greater than 50% respiratory variability, suggesting right atrial pressure of 3 mmHg. FINDINGS  Left Ventricle: Left ventricular ejection fraction, by estimation, is >75%. The left ventricle has hyperdynamic function. The left ventricle has no regional wall motion abnormalities. The left ventricular internal cavity size was normal in size. There is no left ventricular hypertrophy. Left ventricular diastolic parameters are consistent with Grade II diastolic dysfunction (pseudonormalization). Right Ventricle: The right ventricular size is normal. No increase in right ventricular wall thickness. Right ventricular systolic function is normal. Left Atrium: Left atrial size was mildly dilated. Right Atrium: Right atrial size was mildly dilated. Pericardium: There is no evidence of pericardial effusion. Mitral Valve: The mitral valve is normal in structure. Mild mitral valve regurgitation.  No evidence of mitral valve stenosis. Tricuspid Valve: The tricuspid valve is normal in structure. Tricuspid valve regurgitation is mild . No evidence of tricuspid stenosis. Aortic Valve: The aortic valve is calcified. Aortic valve regurgitation is trivial. Aortic valve sclerosis/calcification is present, without any evidence of aortic stenosis. Aortic valve mean gradient measures 6.0 mmHg. Aortic valve peak gradient measures 10.4 mmHg. Aortic valve area, by VTI measures 2.63 cm. Pulmonic Valve: The pulmonic valve was normal in structure. Pulmonic valve regurgitation is not visualized. No evidence of pulmonic stenosis. Aorta: The aortic root is normal in size and structure. Venous: The inferior vena cava is normal in size with greater than 50% respiratory variability, suggesting right atrial pressure of 3 mmHg. IAS/Shunts: No atrial level shunt detected by color flow Doppler.  LEFT VENTRICLE PLAX 2D LVIDd:         3.40 cm     Diastology LVIDs:         2.50 cm     LV e' medial:    10.80 cm/s LV PW:         1.20 cm     LV E/e' medial:  14.6 LV IVS:        0.90 cm     LV e' lateral:   9.36 cm/s LVOT diam:     2.20 cm     LV E/e' lateral: 16.8 LV SV:         81 LV SV Index:   46 LVOT Area:     3.80 cm  LV Volumes (MOD) LV vol d, MOD A2C: 76.6 ml LV vol  d, MOD A4C: 92.5 ml LV vol s, MOD A2C: 26.4 ml LV vol s, MOD A4C: 33.1 ml LV SV MOD A2C:     50.2 ml LV SV MOD A4C:     92.5 ml LV SV MOD BP:      54.0 ml RIGHT VENTRICLE             IVC RV S prime:     15.30 cm/s  IVC diam: 2.20 cm TAPSE (M-mode): 2.3 cm LEFT ATRIUM             Index        RIGHT ATRIUM           Index LA diam:        3.40 cm 1.96 cm/m   RA Area:     10.20 cm LA Vol (A2C):   48.3 ml 27.84 ml/m  RA Volume:   21.00 ml  12.10 ml/m LA Vol (A4C):   34.1 ml 19.65 ml/m LA Biplane Vol: 41.8 ml 24.09 ml/m  AORTIC VALVE AV Area (Vmax):    2.34 cm AV Area (Vmean):   2.37 cm AV Area (VTI):     2.63 cm AV Vmax:           161.00 cm/s AV Vmean:           117.000 cm/s AV VTI:            0.307 m AV Peak Grad:      10.4 mmHg AV Mean Grad:      6.0 mmHg LVOT Vmax:         99.20 cm/s LVOT Vmean:        72.800 cm/s LVOT VTI:          0.212 m LVOT/AV VTI ratio: 0.69  AORTA Ao Root diam: 2.80 cm Ao Asc diam:  3.50 cm MITRAL VALVE MV Area (PHT): 4.56 cm     SHUNTS MV Decel Time: 167 msec     Systemic VTI:  0.21 m MV E velocity: 157.50 cm/s  Systemic Diam: 2.20 cm Adrian Blackwater Electronically signed by Adrian Blackwater Signature Date/Time: 02/13/2022/3:58:14 PM    Final    US RENAL  Result Date: 02/13/2022 CLINICAL DATA:  Renal dysfunction EXAM: RENAL / URINARY TRACT ULTRASOUND COMPLETE COMPARISON:  CT done on 02/10/2022 FINDINGS: Right Kidney: Renal measurements: 9.7 x 5 x 4.4 cm = volume: 110.9 mL. There is no hydronephrosis. There is slightly increased cortical echogenicity. There is no focal cortical thinning or perinephric fluid collection. Left Kidney: Renal measurements: 9.7 x 4.6 x 4.3 cm = volume: 100.3 mL. There is no hydronephrosis. There is no perinephric fluid collection. There is 1.2 by 2.3 x 1.8 cm cyst in the upper pole. There is 1.2 x 1 x 1.3 cm cyst in the midportion. Bladder: Appears normal for degree of bladder distention. Other: None. IMPRESSION: There is no hydronephrosis.  Left renal cysts. Electronically Signed   By: Ernie Avena M.D.   On: 02/13/2022 13:22        Scheduled Meds:  amLODipine  10 mg Oral Daily   aspirin EC  81 mg Oral QODAY   cefadroxil  500 mg Oral Daily   Chlorhexidine Gluconate Cloth  6 each Topical Daily   clopidogrel  75 mg Oral Daily   heparin injection (subcutaneous)  5,000 Units Subcutaneous Q8H   insulin aspart  0-6 Units Subcutaneous TID WC   insulin glargine-yfgn  5 Units Subcutaneous Daily   levothyroxine  50 mcg  Oral Q0600   loratadine  10 mg Oral Daily   oseltamivir  30 mg Oral QODAY   pantoprazole  40 mg Oral Daily   potassium chloride  40 mEq Oral Once   rosuvastatin  10 mg Oral Daily    sodium chloride flush  3 mL Intravenous Q12H   Continuous Infusions:  sodium chloride Stopped (02/12/22 1900)   sodium chloride 75 mL/hr at 02/15/22 0828     LOS: 5 days    Time spent: 35 minutes    Ramiro Harvestaniel Damya Comley, MD [facility]   To contact the attending provider between 7A-7P or the covering provider during after hours 7P-7A, please log into the web site [email or web address] and access using universal [place] password for that web site. If you do not have the password, please call the hospital operator.  02/15/2022, 11:14 AM

## 2022-02-16 DIAGNOSIS — I214 Non-ST elevation (NSTEMI) myocardial infarction: Secondary | ICD-10-CM

## 2022-02-16 DIAGNOSIS — N179 Acute kidney failure, unspecified: Secondary | ICD-10-CM | POA: Diagnosis not present

## 2022-02-16 DIAGNOSIS — I2489 Other forms of acute ischemic heart disease: Secondary | ICD-10-CM | POA: Diagnosis not present

## 2022-02-16 DIAGNOSIS — E875 Hyperkalemia: Secondary | ICD-10-CM | POA: Diagnosis not present

## 2022-02-16 DIAGNOSIS — A419 Sepsis, unspecified organism: Secondary | ICD-10-CM | POA: Diagnosis not present

## 2022-02-16 LAB — CBC
HCT: 34.5 % — ABNORMAL LOW (ref 36.0–46.0)
Hemoglobin: 11.3 g/dL — ABNORMAL LOW (ref 12.0–15.0)
MCH: 31.4 pg (ref 26.0–34.0)
MCHC: 32.8 g/dL (ref 30.0–36.0)
MCV: 95.8 fL (ref 80.0–100.0)
Platelets: 308 10*3/uL (ref 150–400)
RBC: 3.6 MIL/uL — ABNORMAL LOW (ref 3.87–5.11)
RDW: 12.6 % (ref 11.5–15.5)
WBC: 8.6 10*3/uL (ref 4.0–10.5)
nRBC: 0 % (ref 0.0–0.2)

## 2022-02-16 LAB — GLUCOSE, CAPILLARY
Glucose-Capillary: 152 mg/dL — ABNORMAL HIGH (ref 70–99)
Glucose-Capillary: 291 mg/dL — ABNORMAL HIGH (ref 70–99)

## 2022-02-16 LAB — BASIC METABOLIC PANEL
Anion gap: 7 (ref 5–15)
BUN: 32 mg/dL — ABNORMAL HIGH (ref 8–23)
CO2: 23 mmol/L (ref 22–32)
Calcium: 8.7 mg/dL — ABNORMAL LOW (ref 8.9–10.3)
Chloride: 108 mmol/L (ref 98–111)
Creatinine, Ser: 1.42 mg/dL — ABNORMAL HIGH (ref 0.44–1.00)
GFR, Estimated: 36 mL/min — ABNORMAL LOW (ref >60)
Glucose, Bld: 176 mg/dL — ABNORMAL HIGH (ref 70–99)
Potassium: 3.3 mmol/L — ABNORMAL LOW (ref 3.5–5.1)
Sodium: 138 mmol/L (ref 135–145)

## 2022-02-16 MED ORDER — ACETAMINOPHEN 325 MG PO TABS: 650.0000 mg | ORAL_TABLET | Freq: Four times a day (QID) | ORAL | Status: AC | PRN

## 2022-02-16 MED ORDER — CEFADROXIL 500 MG PO CAPS: 500.0000 mg | ORAL_CAPSULE | Freq: Every day | ORAL | 0 refills | Status: AC

## 2022-02-16 MED ORDER — POTASSIUM CHLORIDE CRYS ER 20 MEQ PO TBCR
40.0000 meq | EXTENDED_RELEASE_TABLET | Freq: Once | ORAL | Status: AC
  Administered 2022-02-16: 40 meq via ORAL
  Filled 2022-02-16: qty 2

## 2022-02-16 MED ORDER — ONDANSETRON 4 MG PO TBDP: 4.0000 mg | ORAL_TABLET | Freq: Four times a day (QID) | ORAL | 0 refills | Status: AC | PRN

## 2022-02-16 MED ORDER — AMLODIPINE BESYLATE 10 MG PO TABS: 10.0000 mg | ORAL_TABLET | Freq: Every day | ORAL | 1 refills | Status: AC

## 2022-02-16 MED ORDER — ENSURE ENLIVE PO LIQD: 237.0000 mL | Freq: Two times a day (BID) | ORAL | 12 refills | Status: AC

## 2022-02-16 MED ORDER — ROSUVASTATIN CALCIUM 10 MG PO TABS: 10.0000 mg | ORAL_TABLET | Freq: Every day | ORAL | 1 refills | Status: AC

## 2022-02-16 MED ORDER — GABAPENTIN 100 MG PO CAPS: 100.0000 mg | ORAL_CAPSULE | Freq: Two times a day (BID) | ORAL | 1 refills | Status: AC

## 2022-02-16 NOTE — Inpatient Diabetes Management (Signed)
Inpatient Diabetes Program Recommendations  AACE/ADA: New Consensus Statement on Inpatient Glycemic Control (2015)  Target Ranges:  Prepandial:   less than 140 mg/dL      Peak postprandial:   less than 180 mg/dL (1-2 hours)      Critically ill patients:  140 - 180 mg/dL    Latest Reference Range & Units 02/15/22 07:54 02/15/22 12:17 02/15/22 16:28 02/15/22 20:51  Glucose-Capillary 70 - 99 mg/dL 812 (H) 751 (H) 700 (H) 283 (H)  (H): Data is abnormally high    Current Orders: Semglee 5 units Daily     Novolog 0-6 units TID     MD- Please consider increasing the Novolog SSI to the 0-9 unit scale and Add the HS scale coverage as well (CBG last PM was 283)   --Will follow patient during hospitalization--  Ambrose Finland RN, MSN, CDCES Diabetes Coordinator Inpatient Glycemic Control Team Team Pager: [phone] (8a-5p)

## 2022-02-16 NOTE — Progress Notes (Signed)
Physical Therapy Treatment Patient Details Name: Paula Hansen MRN: [id number] DOB: 01-02-1935 Today's Date: 02/16/2022   History of Present Illness Patient is an 87 year old female who was admitted to ED due to AMS and hypoglycemia. PMH includes TIA, Type II DM, CKD, HTN, hypothyroidism. Patient has a home health nurse, was recently admitted to the hospital on 12/15-12/17 for TIA.    PT Comments    Patient in recliner on arrival and agreeable to PT tx session. Oriented to self and place this session. Requires increased time to come into standing and cues for hand placement, however no physical assistance required. Ambulated 3' with min guard and RW, but demos very slow gait speed with patient being easily distracted by environment. On RA during mobility, spO2 >94% throughout. On return to room, breakfast arrived and patient deferred further mobility or exercise. D/c plan remains appropriate.    Recommendations for follow up therapy are one component of a multi-disciplinary discharge planning process, led by the attending physician.  Recommendations may be updated based on patient status, additional functional criteria and insurance authorization.  Follow Up Recommendations  Home health PT     Assistance Recommended at Discharge Frequent or constant Supervision/Assistance  Patient can return home with the following A little help with walking and/or transfers;Assist for transportation;Assistance with cooking/housework;A little help with bathing/dressing/bathroom;Help with stairs or ramp for entrance;Direct supervision/assist for medications management   Equipment Recommendations  None recommended by PT    Recommendations for Other Services       Precautions / Restrictions Precautions Precautions: Fall Restrictions Weight Bearing Restrictions: No     Mobility  Bed Mobility               General bed mobility comments: in recliner on arrival    Transfers Overall transfer  level: Needs assistance Equipment used: Rolling Leeana Creer (2 wheels) Transfers: Sit to/from Stand Sit to Stand: Min guard           General transfer comment: cues for hand placement. Increased time to come into standing but no assistance required    Ambulation/Gait Ambulation/Gait assistance: Min guard Gait Distance (Feet): 90 Feet Assistive device: Rolling Wilgus Deyton (2 wheels) Gait Pattern/deviations: Narrow base of support, Step-through pattern, Decreased stride length, Shuffle Gait velocity: decreased     General Gait Details: easily distracted during gait by environment. Min guard for safety. very slow steady gait   Stairs             Wheelchair Mobility    Modified Rankin (Stroke Patients Only)       Balance Overall balance assessment: Needs assistance Sitting-balance support: Feet supported Sitting balance-Leahy Scale: Fair     Standing balance support: Bilateral upper extremity supported, During functional activity Standing balance-Leahy Scale: Fair                              Cognition Arousal/Alertness: Awake/alert Behavior During Therapy: WFL for tasks assessed/performed Overall Cognitive Status: No family/caregiver present to determine baseline cognitive functioning Area of Impairment: Orientation, Memory, Problem solving                 Orientation Level: Disoriented to, Situation, Time   Memory: Decreased short-term memory   Safety/Judgement: Decreased awareness of safety   Problem Solving: Slow processing General Comments: oriented to self and place. Asking if she had a stroke. Attempts at reorientation with patient unable to recall at end of session  Exercises      General Comments General comments (skin integrity, edema, etc.): on RA during mobility, spO2 >94% throughout      Pertinent Vitals/Pain Pain Assessment Pain Assessment: No/denies pain    Home Living                          Prior  Function            PT Goals (current goals can now be found in the care plan section) Acute Rehab PT Goals Patient Stated Goal: to return home PT Goal Formulation: With patient Time For Goal Achievement: 02/27/22 Potential to Achieve Goals: Good Progress towards PT goals: Progressing toward goals    Frequency    Min 2X/week      PT Plan Current plan remains appropriate    Co-evaluation              AM-PAC PT "6 Clicks" Mobility   Outcome Measure  Help needed turning from your back to your side while in a flat bed without using bedrails?: None Help needed moving from lying on your back to sitting on the side of a flat bed without using bedrails?: A Little Help needed moving to and from a bed to a chair (including a wheelchair)?: A Little Help needed standing up from a chair using your arms (e.g., wheelchair or bedside chair)?: A Little Help needed to walk in hospital room?: A Little Help needed climbing 3-5 steps with a railing? : A Lot 6 Click Score: 18    End of Session Equipment Utilized During Treatment: Gait belt Activity Tolerance: Patient tolerated treatment well Patient left: in chair;with call bell/phone within reach;with chair alarm set Nurse Communication: Mobility status PT Visit Diagnosis: Muscle weakness (generalized) (M62.81);History of falling (Z91.81);Difficulty in walking, not elsewhere classified (R26.2);Unsteadiness on feet (R26.81);Other abnormalities of gait and mobility (R26.89)     Time: 8110-3159 PT Time Calculation (min) (ACUTE ONLY): 24 min  Charges:  $Therapeutic Activity: 23-37 mins                     Gabe Glace A. Dan Humphreys PT, DPT [facility]    Trameka Dorough A Eligha Kmetz 02/16/2022, 9:27 AM

## 2022-02-16 NOTE — TOC Progression Note (Signed)
Transition of Care [facility]) - Progression Note    Patient Details  Name: Paula Hansen MRN: [id number] Date of Birth: 09-27-1934  [facility]) CM/SW Contact  Marlowe Sax, RN Phone Number: 02/16/2022, 1:52 PM  Clinical Narrative:     Per [facility] they will not accept the patient for Home Hospice, Palliative outpatient referral was given, Son has 24 hour care for the patient with [facility] and [facility] home care     Barriers to Discharge: Continued Medical Work up  Expected Discharge Plan and Services       Living arrangements for the past 2 months: Single Family Home                                       Social Determinants of Health (SDOH) Interventions SDOH Screenings   Food Insecurity: No Food Insecurity (02/06/2022)  Housing: Low Risk  (02/06/2022)  Transportation Needs: No Transportation Needs (02/06/2022)  Utilities: Not At Risk (02/06/2022)  Tobacco Use: Medium Risk (02/12/2022)    Readmission Risk Interventions     No data to display

## 2022-02-16 NOTE — Plan of Care (Signed)
  Problem: Clinical Measurements: Goal: Ability to maintain clinical measurements within normal limits will improve Outcome: Adequate for Discharge Goal: Will remain free from infection Outcome: Adequate for Discharge Goal: Diagnostic test results will improve Outcome: Adequate for Discharge Goal: Respiratory complications will improve Outcome: Adequate for Discharge Goal: Cardiovascular complication will be avoided Outcome: Adequate for Discharge   Problem: Activity: Goal: Risk for activity intolerance will decrease Outcome: Adequate for Discharge   

## 2022-02-16 NOTE — Progress Notes (Signed)
[facility] Cardiology    SUBJECTIVE: Patient asleep denies any complaints seems to be doing reasonably well   Vitals:   02/15/22 0840 02/15/22 1540 02/15/22 2330 02/16/22 0810  BP: (!) 160/71 132/82 (!) 169/75 (!) 176/73  Pulse: 91 90 86 80  Resp:   16 20  Temp: 97.9 F (36.6 C) (!) 97.3 F (36.3 C) 99.2 F (37.3 C) 97.7 F (36.5 C)  TempSrc:      SpO2: 96% 94% 99% 92%  Weight:      Height:         Intake/Output Summary (Last 24 hours) at 02/16/2022 1058 Last data filed at 02/16/2022 0316 Gross per 24 hour  Intake 1471.9 ml  Output 1800 ml  Net -328.1 ml      PHYSICAL EXAM  General: Well developed, well nourished, in no acute distress HEENT:  Normocephalic and atramatic Neck:  No JVD.  Lungs: Clear bilaterally to auscultation and percussion. Heart: HRRR . Normal S1 and S2 without gallops or murmurs.  Abdomen: Bowel sounds are positive, abdomen soft and non-tender  Msk:  Back normal, normal gait. Normal strength and tone for age. Extremities: No clubbing, cyanosis or edema.   Neuro: Alert and oriented X 3. Psych:  Good affect, responds appropriately   LABS: Basic Metabolic Panel: Recent Labs    02/14/22 0553 02/15/22 0609 02/16/22 0429  NA 140 141 138  K 3.4* 3.4* 3.3*  CL 107 109 108  CO2 22 24 23   GLUCOSE 201* 145* 176*  BUN 56* 35* 32*  CREATININE 2.84* 1.63* 1.42*  CALCIUM 8.1* 8.6* 8.7*  MG 1.6* 2.2  --   PHOS 3.7 3.6  --    Liver Function Tests: Recent Labs    02/14/22 0553 02/15/22 0609  ALBUMIN 2.9* 2.8*   No results for input(s): "LIPASE", "AMYLASE" in the last 72 hours. CBC: Recent Labs    02/14/22 0553 02/15/22 0609 02/16/22 0429  WBC 9.4 8.3 8.6  NEUTROABS 6.2  --   --   HGB 10.6* 11.5* 11.3*  HCT 32.2* 34.7* 34.5*  MCV 95.0 96.4 95.8  PLT 254 294 308   Cardiac Enzymes: No results for input(s): "CKTOTAL", "CKMB", "CKMBINDEX", "TROPONINI" in the last 72 hours. BNP: Invalid input(s): "POCBNP" D-Dimer: No results for  input(s): "DDIMER" in the last 72 hours. Hemoglobin A1C: No results for input(s): "HGBA1C" in the last 72 hours. Fasting Lipid Panel: No results for input(s): "CHOL", "HDL", "LDLCALC", "TRIG", "CHOLHDL", "LDLDIRECT" in the last 72 hours. Thyroid Function Tests: No results for input(s): "TSH", "T4TOTAL", "T3FREE", "THYROIDAB" in the last 72 hours.  Invalid input(s): "FREET3" Anemia Panel: No results for input(s): "VITAMINB12", "FOLATE", "FERRITIN", "TIBC", "IRON", "RETICCTPCT" in the last 72 hours.  No results found.   Echo showed left ventricular function  TELEMETRY: :  ASSESSMENT AND PLAN:  Principal Problem:   Sepsis (HCC) Active Problems:   Essential hypertension   Type II diabetes mellitus (HCC)   Hypothyroidism   AKI (acute kidney injury) (HCC)   Acute encephalopathy   Hyperkalemia   Demand ischemia   Sepsis secondary to UTI (HCC)   Acute renal failure (HCC)    Plan Sepsis related to UTI continue broad-spectrum antibiotic therapy Diabetes type 2 continue diabetes monitoring and therapy Acute on chronic renal insufficiency with hyperkalemia continue current management therapy to reduce potassium as necessary Non-STEMI related to previous ischemia would recommend conservative management do not recommend any invasive procedure for this current condition and with her comorbid state treat medically Acute hypoxic  respiratory failure related to overall condition of sepsis continue supplemental oxygen as necessary Acute metabolic encephalopathy related to sepsis with Klebsiella in the urine continue broad-spectrum antibiotic therapy and hydration Agree with blood pressure management and control History of TIA recent hospitalization continue Plavix and aspirin for now as well as statin therapy Recommend conservative medical therapy for now no invasive procedures scheduled   Alwyn Pea, MD, 02/16/2022 10:58 AM

## 2022-02-16 NOTE — Progress Notes (Incomplete)
Palliative:   Chart review completed.   Conference with attending, bedside nursing staff, transition of care team related to patient condition, needs, goals of care, disposition.  Plan:    *** no charge Lillia Carmel, NP Palliative Medicine Team  Team Phone [phone] Greater than 50% of this time was spent counseling and coordinating care related to the above assessment and plan.

## 2022-02-16 NOTE — TOC Progression Note (Signed)
Transition of Care [facility]) - Progression Note    Patient Details  Name: Paula Hansen MRN: [id number] Date of Birth: 04-19-1934  [facility]) CM/SW Contact  Marlowe Sax, RN Phone Number: 02/16/2022, 3:26 PM  Clinical Narrative:    Sherron Monday with Onalee Hua the son, He is aware of the discharge and wants to get the home with hospice assessment completed with [facility], she will continue with [facility] and [facility] at home  She does not have DME needs The son Earlene Plater will have her caregiver provide transportation home, I read off the mobility specialist note to explain her ambulation status  I notified Shania with [facility] of the Hospice at home referral and asked if they would follow up    Barriers to Discharge: Continued Medical Work up  Expected Discharge Plan and Services       Living arrangements for the past 2 months: Single Family Home Expected Discharge Date: 02/16/22                                     Social Determinants of Health (SDOH) Interventions SDOH Screenings   Food Insecurity: No Food Insecurity (02/06/2022)  Housing: Low Risk  (02/06/2022)  Transportation Needs: No Transportation Needs (02/06/2022)  Utilities: Not At Risk (02/06/2022)  Tobacco Use: Medium Risk (02/12/2022)    Readmission Risk Interventions     No data to display

## 2022-02-16 NOTE — Progress Notes (Signed)
[facility]) Hospital Liaison Note  Received request from Transitions of Care Manager, Deliliah, for hospice services at home after discharge. Chart and patient information under review by [facility] physician.   MSW contacted son/David to confirm interest. Unfortunately, Onalee Hua did not answer. MSW left VM for call to be returned.   Of note, patient has been evaluated for [facility] hospice services and was deemed not eligible. [facility] Palliative Liaison, D. Clementeen Graham, has had an extensive conversation with family to educate that patient would need to be evaluated for hospice services, and approved, before services begin.    [facility] information and contact numbers given to family & above information shared with TOC.   Please call with any questions/concerns.    Thank you for the opportunity to participate in this patient's care.   Odette Fraction, MSW [facility] Liaison  [phone]

## 2022-02-16 NOTE — Progress Notes (Addendum)
Mobility Specialist - Progress Note   02/16/22 1400  Mobility  Activity Ambulated with assistance in room;Transferred from chair to bed  Level of Assistance Standby assist, set-up cues, supervision of patient - no hands on  Assistive Device Front wheel walker  Distance Ambulated (ft) 4 ft  Activity Response Tolerated well  $Mobility charge 1 Mobility     Pt sitting in recliner upon arrival, utilizing 2L. Pt voices need to be cleaned up. STS from chair x2 with minG, assist for peri-care and new gown change. Several steps towards HOB with minG. Assist on LE to return supine. Pt left in bed with alarm set, needs in reach, family at bedside.    Paula Hansen Mobility Specialist 02/16/22, 3:05 PM

## 2022-02-16 NOTE — Discharge Summary (Addendum)
Physician Discharge Summary  Paula Hansen VHQ:[id number] DOB: 27-Aug-1934 DOA: 02/10/2022  PCP: Paula Nurse, MD  Admit date: 02/10/2022 Discharge date: 02/16/2022  Time spent: 60 minutes  Recommendations for Outpatient Follow-up:  Follow-up with Paula Nurse, MD in 2 weeks.  On follow-up patient will need a basic metabolic profile done to follow-up on electrolytes and renal function.  Patient blood pressure need to be reassessed on follow-up.  Patient's diabetes also need to be followed up upon.  Blood pressure also need to be followed up upon.  Will need outpatient follow-up with palliative care. Follow-up with Dr. Elijah Hansen, cardiology in 2 weeks. Patient will need outpatient follow-up with palliative care.   Discharge Diagnoses:  Principal Problem:   Sepsis (HCC) Active Problems:   Hyperkalemia   AKI (acute kidney injury) (HCC)   Demand ischemia   Acute encephalopathy   Type II diabetes mellitus (HCC)   Hypothyroidism   Essential hypertension   Sepsis secondary to UTI (HCC)   Acute renal failure (HCC)   NSTEMI (non-ST elevated myocardial infarction) Paula Hansen)   Discharge Condition: Stable and improved.  Diet recommendation: Carb modified diet  Filed Weights   02/10/22 1352  Weight: 70.3 kg    History of present illness:  HPI per Dr.Basaraba Rily J Hansen is a 87 y.o. female with medical history significant of recent TIA, type 2 diabetes, CKD, hypertension, hypothyroidism, who presents to the ED due to altered mental status and hypoglycemia.  History obtained through chart review due to patient's altered mental status.   Paula Hansen states she cannot recall what occurred today that led to her being in the hospital. She believes she "had a spell." She does not believe she has been sick the past several days but does not recall being admitted to the hospital several days ago.  At this time, she denies any complaints including chest pain, shortness of breath, abdominal pain,  nausea.   Collateral history obtained from patient's son, Paula Hansen.  Mr. Soza states that his mom has been having declining cognitive function and he believes there may be some underlying cognitive impairment.  She has poor short-term and long-term memory and occasionally has difficulty understanding concepts.  He notes that she is generally alert and oriented despite this though.  He was told by patient's home health Hansen yesterday that Paula Hansen was not drinking or eating as much as normal.  In addition, patient's home health Hansen reported one-time episode of urinary and bowel incontinence while trying to reach the restroom.  Otherwise, her home health Hansen did not report any other symptoms including fever, chills, nausea, vomiting, diarrhea, dysuria or urinary changes.   Of note, patient was recently admitted from 12/15 to 12/17 for TIA.   ED course: On arrival to the ED, patient was hypothermic at 94.3 with blood pressure of 122/41 and heart rate of 77.  She was saturating at 98% on room air.  Initial blood work remarkable for glucose of 128, VBG with pH of 7.16 and pCO2 of 37.  CBC with WBC of 17.6 and hemoglobin of 10.4.  BMP with potassium of 7.4 and creatinine of 8.4 with anion gap of 28.  Initial troponin elevated at 533 and initial lactic acid elevated at 6.5.  CT abdomen/pelvis demonstrating mild colitis.  Chest x-ray with no acute findings.  Due to concern for sepsis, patient was started on ceftriaxone.  She was also started on insulin with calcium gluconate.  Nephrology was consulted by EDP.  [facility]  contacted for admission.  Hospital Course:  #1 septic shock secondary to UTI, POA -Patient admitted with altered mental status, noted to be hypothermic on admission with leukocytosis, elevated lactic acid level, urinalysis consistent with UTI. -CT abdomen and pelvis done on admission concerning for mild colitis however no noted GI symptoms on presentation. -Chest x-ray done initially  reassuring. -Patient noted to decline, with septic shock, noted to be with multiorgan failure with acute renal failure, hyperkalemia, lactic acidosis, concern for respiratory distress/hypoxia, critical care medicine discussed with family initially patient was made comfort. -Patient has improved clinically, alert, following commands, improvement with blood pressure. -Urine cultures with > 100,000 colonies of Klebsiella pneumonia.  Blood cultures with no growth to date. -COVID-19 PCR negative, influenza A and B PCR negative. -Patient improved daily, more alert, some bouts of intermittent confusion. -Case discussed again with family who initially felt in discussion with critical care initially on presentation that patient would have deteriorated quickly however with clinical improvement I in agreement for treatment of reversible causes. -Renal function improved daily during the hospitalization such that by day of discharge creatinine was down to 1.42 from 1.63 from 2.84 from 5.24 which is in line with slow recovery of renal function from admission. -CBC with improvement with leukocytosis.  -Patient treated with IV antibiotics for UTI but discharged on 1 more day of oral antibiotics. -Patient improved clinically, hypotension/shock rate is resolved by day of discharge. -Outpatient follow-up with PCP.   2.  Acute renal failure/hyperkalemia -Patient had presented with acute renal failure on admission with a creatinine as high as 8.43 on day of admission with severe hyperkalemia with potassium of 7.4. -Felt likely secondary to prerenal azotemia in the setting of septic shock and sepsis. -Patient seen by nephrology on admission, PCCM discussed with family who felt patient would not be interested in hemodialysis. -Due to patient's acute deterioration and presentation PCCM discussed with family and initially decision was made to transition to full comfort measures. -Patient transferred to floor, has improved  clinically, repeat comprehensive metabolic profile done on 02/12/2022 noted a creatinine of 0.69 likely an outlier as repeat BMET done 02/13/2022 with a creatinine of 5.24 which is in line of slow improvement of renal function with hydration as to be expected. -Patient hydrated gently with IV fluids, UTI treated with empiric antibiotics. -Urine sodium 126, urine creatinine 33. -Renal function improved during the hospitalization with hydration, patient had good urine output such that by day of discharge creatinine was down to 1.42. -Patient be discharged in stable and improved condition with outpatient follow-up with PCP. -On follow-up renal function will need to be reassessed.  Patient's ACE inhibitor discontinued on discharge.   3.  Non-STEMI/demand ischemia -Patient noted to have a significantly elevated troponin of 533>>> 1036>> 1913>> 2518. -Patient initially followed by PCCM, due to patient's presentation and in discussions with family patient initially made comfort measures. -Patient improved clinically. -Patient denied any chest pain. -EKG done on admission with no significant ischemic changes noted. -Repeat 2D echo with a EF > 75%,    NWMA, grade 2 diastolic dysfunction, mildly dilated left atrial size, mildly dilated right atrial size, mild MVR, trivial AVR.  -Patient maintained on home regimen aspirin, Plavix, statin. -Patient seen by primary cardiologist on day of discharge who recommended conservative treatment at this time. -Outpatient follow-up with primary cardiologist.   4.  Acute hypoxemic respiratory failure -Patient on admission noted to go into acute hypoxemic respiratory failure in the setting of renal failure, concern initially  for pulmonary edema noted to be on high flow nasal cannula. -PCCM initially discussed case with family initially patient transitioned to full comfort measures. -Patient improved clinically during the hospitalization.  Hypoxia improved and patient  will with sats of greater than 92% on room air.   -Outpatient follow-up.     5.  Acute metabolic encephalopathy -Likely secondary to sepsis and uremic encephalopathy. -Renal function improved daily during the hospitalization.  -Patient remained afebrile, improved clinically.   -Blood cultures obtained with no growth x 5 days. -Blood cultures pending with no growth to date. -Urine cultures with > 100,000 colonies of Klebsiella  -Was on IV Rocephin with transitioned to cefadroxil and will be discharged on 1 more day of oral antibiotics to complete a course of treatment.   -Patient improved clinically and was back to baseline by day of discharge.   6.  Diabetes mellitus type 2 -Hemoglobin A1c 7.2 (02/06/2022) -Patient's oral hypoglycemic agents were held during the hospitalization patient maintained on Semglee long-acting insulin 5 units daily as well as sliding scale insulin.   -Home regimen oral hypoglycemic agents will be resumed on discharge.  -Outpatient follow-up with PCP.   7.  Hypothyroidism -Patient maintained on home regimen Synthroid.   8.  Hypertension -Patient noted to have been on amlodipine benazepril, prior to admission.   -Due to acute renal failure benazepril discontinued and will not resume on discharge.   -Patient maintained on Norvasc 10 mg daily which she will be discharged on.  -Outpatient follow-up with PCP.     9.  Recent TIA -Patient noted to have a TIA during recent hospitalization 02/07/2022. -Patient maintained on Plavix and aspirin for secondary stroke prophylaxis.  -Patient also maintained on statin.    10.  GERD -Patient maintained on PPI.   11.  Hypokalemia -Repleted during the hospitalization.   12.  Prognosis -Initially on admission patient was in multiorgan failure, noted to be deteriorating, seen by PCCM who discussed with family goals of care discussion was obtained and patient was transitioned to full comfort measures. -Patient transferred  to the floor, patient however has improved clinically with improvement with renal function, electrolytes more alert and following commands appropriately. -Palliative care consulted to rediscuss goals of care. -Will need outpatient follow-up with palliative care on discharge.      Procedures: CT head 02/10/2022 CT abdomen and pelvis 02/10/2022 Chest x-ray 02/10/2022    Consultations: PCCM: Dr.Dgayli 02/10/2022 Nephrology: Dr. Thedore Mins 02/11/2022 Palliative care: Lillia Carmel 02/12/2022 Cardiology: [facility]  Discharge Exam: Vitals:   02/15/22 2330 02/16/22 0810  BP: (!) 169/75 (!) 176/73  Pulse: 86 80  Resp: 16 20  Temp: 99.2 F (37.3 C) 97.7 F (36.5 C)  SpO2: 99% 92%    General: NAD Cardiovascular: Regular rate rhythm no murmurs rubs or gallops.  No JVD.  No lower extremity edema. Respiratory: Clear to auscultation bilaterally.  No wheezes, no crackles, no rhonchi.  Fair air movement.  Speaking in full sentences.  Discharge Instructions   Discharge Instructions     Diet Carb Modified   Complete by: As directed    Increase activity slowly   Complete by: As directed       Allergies as of 02/16/2022       Reactions   Nickel Other (See Comments)   BREAKING OUT IN MOUTH IRRITATION RINGING IN THE EARS   Atorvastatin Other (See Comments)   Muscle Pain   Ezetimibe-simvastatin Other (See Comments)   MYALGIAS   Pravastatin Other (See Comments)  MYALGIAS   Simvastatin Other (See Comments)   MYALGIAS   Amoxicillin Other (See Comments)   UNSPECIFIED REACTION   Codeine Other (See Comments)   Chest pain   Morphine And Related Nausea And Vomiting   Oxycodone Nausea And Vomiting   Shellfish Allergy Nausea And Vomiting   Sulfa Antibiotics Nausea Only, Other (See Comments)   Upset stomach with burning        Medication List     STOP taking these medications    amLODipine-benazepril 10-20 MG capsule Commonly known as: LOTREL   naproxen sodium 220 MG  tablet Commonly known as: ALEVE       TAKE these medications    acetaminophen 325 MG tablet Commonly known as: TYLENOL Take 2 tablets (650 mg total) by mouth every 6 (six) hours as needed (Fever >/= 101).   ALIGN PO Take by mouth.   amLODipine 10 MG tablet Commonly known as: NORVASC Take 1 tablet (10 mg total) by mouth daily. Start taking on: February 17, 2022   aspirin EC 81 MG tablet Take 81 mg by mouth every other day.   Biotin 2.5 MG Tabs Take 2,500 mcg by mouth daily.   CALTRATE PLUS PO Take 1 tablet by mouth daily.   cefadroxil 500 MG capsule Commonly known as: DURICEF Take 1 capsule (500 mg total) by mouth daily for 1 day. Start taking on: February 17, 2022   cetirizine 10 MG tablet Commonly known as: ZYRTEC Take 10 mg by mouth daily.   clopidogrel 75 MG tablet Commonly known as: PLAVIX Take by mouth.   Coenzyme Q10 10 MG capsule Take 10 mg by mouth daily.   esomeprazole 40 MG capsule Commonly known as: NEXIUM Take 40 mg by mouth daily before breakfast.   feeding supplement Liqd Take 237 mLs by mouth 2 (two) times daily between meals. Start taking on: February 17, 2022   Flaxseed Oil 1000 MG Caps Take 1,000 mg by mouth daily.   gabapentin 100 MG capsule Commonly known as: NEURONTIN Take 1 capsule (100 mg total) by mouth 2 (two) times daily. What changed:  medication strength how much to take when to take this   glipiZIDE 10 MG 24 hr tablet Commonly known as: GLUCOTROL XL Take 10 mg by mouth 2 (two) times daily.   levothyroxine 50 MCG tablet Commonly known as: SYNTHROID Take 50 mcg by mouth daily before breakfast.   metFORMIN 500 MG tablet Commonly known as: GLUCOPHAGE Take 1,000 mg by mouth 2 (two) times daily with a meal.   multivitamin with minerals Tabs tablet Take 1 tablet by mouth daily.   ondansetron 4 MG disintegrating tablet Commonly known as: ZOFRAN-ODT Take 1 tablet (4 mg total) by mouth every 6 (six) hours as needed  for nausea.   pioglitazone 30 MG tablet Commonly known as: ACTOS Take 30 mg by mouth daily.   rosuvastatin 10 MG tablet Commonly known as: CRESTOR Take 1 tablet (10 mg total) by mouth daily. Start taking on: February 17, 2022 What changed:  medication strength See the new instructions.   Vitamin D-1000 Max St 25 MCG (1000 UT) tablet Generic drug: Cholecalciferol Take 1,000 Units by mouth daily.   vitamin E 180 MG (400 UNITS) capsule Take 400 Units by mouth daily.       Allergies  Allergen Reactions   Nickel Other (See Comments)    BREAKING OUT IN MOUTH IRRITATION RINGING IN THE EARS   Atorvastatin Other (See Comments)    Muscle Pain  Ezetimibe-Simvastatin Other (See Comments)    MYALGIAS   Pravastatin Other (See Comments)    MYALGIAS   Simvastatin Other (See Comments)    MYALGIAS   Amoxicillin Other (See Comments)    UNSPECIFIED REACTION    Codeine Other (See Comments)    Chest pain   Morphine And Related Nausea And Vomiting   Oxycodone Nausea And Vomiting   Shellfish Allergy Nausea And Vomiting   Sulfa Antibiotics Nausea Only and Other (See Comments)    Upset stomach with burning     Follow-up Information     Paula Nurse, MD. Schedule an appointment as soon as possible for a visit in 2 week(s).   Specialty: Internal Medicine Contact information: [address] [place] [address] [phone]         Alwyn Pea, MD. Schedule an appointment as soon as possible for a visit in 2 week(s).   Specialties: Cardiology, Internal Medicine Contact information: [address] [place] [address] [phone]         Palliative care Follow up.                   The results of significant diagnostics from this hospitalization (including imaging, microbiology, ancillary and laboratory) are listed below for reference.    Significant Diagnostic Studies: ECHOCARDIOGRAM COMPLETE  Result Date: 02/13/2022     ECHOCARDIOGRAM REPORT   Patient Name:   Paula Hansen Shedrick Date of Exam: 02/13/2022 Medical Rec #:  [id number]    Height:       63.0 in Accession #:    [id number]   Weight:       155.0 lb Date of Birth:  1934/04/22    BSA:          1.735 m Patient Age:    87 years     BP:           178/69 mmHg Patient Gender: F            HR:           93 bpm. Exam Location:  [facility] Procedure: 2D Echo, Cardiac Doppler and Color Doppler Indications:    Elevated troponin  History:        Patient has prior history of Echocardiogram examinations, most                 recent 02/06/2022. Risk Factors:Hypertension and Diabetes.  Sonographer:    Milda Smart Referring Phys: [id number] Seamus Warehime V Kaseem Vastine  Sonographer Comments: Image acquisition challenging due to respiratory motion. IMPRESSIONS  1. Left ventricular ejection fraction, by estimation, is >75%. The left ventricle has hyperdynamic function. The left ventricle has no regional wall motion abnormalities. Left ventricular diastolic parameters are consistent with Grade II diastolic dysfunction (pseudonormalization).  2. Right ventricular systolic function is normal. The right ventricular size is normal.  3. Left atrial size was mildly dilated.  4. Right atrial size was mildly dilated.  5. The mitral valve is normal in structure. Mild mitral valve regurgitation. No evidence of mitral stenosis.  6. The aortic valve is calcified. Aortic valve regurgitation is trivial. Aortic valve sclerosis/calcification is present, without any evidence of aortic stenosis.  7. The inferior vena cava is normal in size with greater than 50% respiratory variability, suggesting right atrial pressure of 3 mmHg. FINDINGS  Left Ventricle: Left ventricular ejection fraction, by estimation, is >75%. The left ventricle has hyperdynamic function. The left ventricle has no regional wall motion abnormalities. The left ventricular internal  cavity size was normal in size. There is no left ventricular hypertrophy. Left ventricular  diastolic parameters are consistent with Grade II diastolic dysfunction (pseudonormalization). Right Ventricle: The right ventricular size is normal. No increase in right ventricular wall thickness. Right ventricular systolic function is normal. Left Atrium: Left atrial size was mildly dilated. Right Atrium: Right atrial size was mildly dilated. Pericardium: There is no evidence of pericardial effusion. Mitral Valve: The mitral valve is normal in structure. Mild mitral valve regurgitation. No evidence of mitral valve stenosis. Tricuspid Valve: The tricuspid valve is normal in structure. Tricuspid valve regurgitation is mild . No evidence of tricuspid stenosis. Aortic Valve: The aortic valve is calcified. Aortic valve regurgitation is trivial. Aortic valve sclerosis/calcification is present, without any evidence of aortic stenosis. Aortic valve mean gradient measures 6.0 mmHg. Aortic valve peak gradient measures 10.4 mmHg. Aortic valve area, by VTI measures 2.63 cm. Pulmonic Valve: The pulmonic valve was normal in structure. Pulmonic valve regurgitation is not visualized. No evidence of pulmonic stenosis. Aorta: The aortic root is normal in size and structure. Venous: The inferior vena cava is normal in size with greater than 50% respiratory variability, suggesting right atrial pressure of 3 mmHg. IAS/Shunts: No atrial level shunt detected by color flow Doppler.  LEFT VENTRICLE PLAX 2D LVIDd:         3.40 cm     Diastology LVIDs:         2.50 cm     LV e' medial:    10.80 cm/s LV PW:         1.20 cm     LV E/e' medial:  14.6 LV IVS:        0.90 cm     LV e' lateral:   9.36 cm/s LVOT diam:     2.20 cm     LV E/e' lateral: 16.8 LV SV:         81 LV SV Index:   46 LVOT Area:     3.80 cm  LV Volumes (MOD) LV vol d, MOD A2C: 76.6 ml LV vol d, MOD A4C: 92.5 ml LV vol s, MOD A2C: 26.4 ml LV vol s, MOD A4C: 33.1 ml LV SV MOD A2C:     50.2 ml LV SV MOD A4C:     92.5 ml LV SV MOD BP:      54.0 ml RIGHT VENTRICLE              IVC RV S prime:     15.30 cm/s  IVC diam: 2.20 cm TAPSE (M-mode): 2.3 cm LEFT ATRIUM             Index        RIGHT ATRIUM           Index LA diam:        3.40 cm 1.96 cm/m   RA Area:     10.20 cm LA Vol (A2C):   48.3 ml 27.84 ml/m  RA Volume:   21.00 ml  12.10 ml/m LA Vol (A4C):   34.1 ml 19.65 ml/m LA Biplane Vol: 41.8 ml 24.09 ml/m  AORTIC VALVE AV Area (Vmax):    2.34 cm AV Area (Vmean):   2.37 cm AV Area (VTI):     2.63 cm AV Vmax:           161.00 cm/s AV Vmean:          117.000 cm/s AV VTI:  0.307 m AV Peak Grad:      10.4 mmHg AV Mean Grad:      6.0 mmHg LVOT Vmax:         99.20 cm/s LVOT Vmean:        72.800 cm/s LVOT VTI:          0.212 m LVOT/AV VTI ratio: 0.69  AORTA Ao Root diam: 2.80 cm Ao Asc diam:  3.50 cm MITRAL VALVE MV Area (PHT): 4.56 cm     SHUNTS MV Decel Time: 167 msec     Systemic VTI:  0.21 m MV E velocity: 157.50 cm/s  Systemic Diam: 2.20 cm Adrian Blackwater Electronically signed by Adrian Blackwater Signature Date/Time: 02/13/2022/3:58:14 PM    Final    US RENAL  Result Date: 02/13/2022 CLINICAL DATA:  Renal dysfunction EXAM: RENAL / URINARY TRACT ULTRASOUND COMPLETE COMPARISON:  CT done on 02/10/2022 FINDINGS: Right Kidney: Renal measurements: 9.7 x 5 x 4.4 cm = volume: 110.9 mL. There is no hydronephrosis. There is slightly increased cortical echogenicity. There is no focal cortical thinning or perinephric fluid collection. Left Kidney: Renal measurements: 9.7 x 4.6 x 4.3 cm = volume: 100.3 mL. There is no hydronephrosis. There is no perinephric fluid collection. There is 1.2 by 2.3 x 1.8 cm cyst in the upper pole. There is 1.2 x 1 x 1.3 cm cyst in the midportion. Bladder: Appears normal for degree of bladder distention. Other: None. IMPRESSION: There is no hydronephrosis.  Left renal cysts. Electronically Signed   By: Ernie Avena M.D.   On: 02/13/2022 13:22   CT ABDOMEN PELVIS WO CONTRAST  Result Date: 02/10/2022 CLINICAL DATA:  Sepsis. EXAM: CT ABDOMEN AND  PELVIS WITHOUT CONTRAST TECHNIQUE: Multidetector CT imaging of the abdomen and pelvis was performed following the standard protocol without IV contrast. RADIATION DOSE REDUCTION: This exam was performed according to the departmental dose-optimization program which includes automated exposure control, adjustment of the mA and/or kV according to patient size and/or use of iterative reconstruction technique. COMPARISON:  None Available. FINDINGS: Lower chest: There is atelectasis in the lung bases. Hepatobiliary: Gallstones are present. There is no biliary ductal dilatation. The liver is within normal limits. Pancreas: Unremarkable. No pancreatic ductal dilatation or surrounding inflammatory changes. Spleen: Small in size.  No focal abnormality. Adrenals/Urinary Tract: Is a 2.3 cm hypodense area in the superior pole the left kidney measuring 35 Hounsfield units. There is some cortical scarring throughout the right kidney. There is a nonobstructing calculus measuring 3 mm in the superior pole the left kidney. There is no hydronephrosis or perinephric fat stranding. The adrenal glands and bladder are within normal limits. Stomach/Bowel: There is likely mild diffuse colonic wall thickening. There is no surrounding inflammation. There is colonic diverticulosis without evidence for acute diverticulitis. Small hiatal hernia is present. Stomach and small bowel are within normal limits. The appendix is within normal limits. Vascular/Lymphatic: Aortic atherosclerosis. No enlarged abdominal or pelvic lymph nodes. Reproductive: Status post hysterectomy. No adnexal masses. Other: No ascites.  Small fat containing umbilical hernia. Musculoskeletal: No acute or significant osseous findings. IMPRESSION: 1. Mild diffuse colonic wall thickening worrisome for mild colitis. 2. Colonic diverticulosis without evidence for acute diverticulitis. 3. Cholelithiasis. 4. Nonobstructing left renal calculus. 5. 2.3 cm hypodense area in the  superior pole the left kidney, indeterminate. Recommend further evaluation with ultrasound or MRI. Aortic Atherosclerosis (ICD10-I70.0). Electronically Signed   By: Darliss Cheney M.D.   On: 02/10/2022 17:12   DG Chest 2 View  Result  Date: 02/10/2022 CLINICAL DATA:  Hypoxia. EXAM: CHEST - 2 VIEW COMPARISON:  Jul 19, 2018. FINDINGS: The heart size and mediastinal contours are within normal limits. Both lungs are clear. The visualized skeletal structures are unremarkable. IMPRESSION: No active cardiopulmonary disease. Electronically Signed   By: Lupita Raider M.D.   On: 02/10/2022 14:51   CT Head Wo Contrast  Result Date: 02/10/2022 CLINICAL DATA:  Altered mental status, hypoglycemia EXAM: CT HEAD WITHOUT CONTRAST TECHNIQUE: Contiguous axial images were obtained from the base of the skull through the vertex without intravenous contrast. RADIATION DOSE REDUCTION: This exam was performed according to the departmental dose-optimization program which includes automated exposure control, adjustment of the mA and/or kV according to patient size and/or use of iterative reconstruction technique. COMPARISON:  02/05/2022 FINDINGS: Brain: No acute intracranial findings are seen in noncontrast CT brain. There are no signs of bleeding within the cranium. Cortical sulci are prominent. There is decreased density in periventricular white matter. Possible small old lacunar infarct is seen in right thalamus. There is no focal edema or mass effect. Vascular: Scattered arterial calcifications are seen. Skull: Unremarkable. Sinuses/Orbits: Unremarkable Other: No significant interval changes are noted. IMPRESSION: No acute intracranial findings are seen in noncontrast CT brain. Atrophy. Small-vessel disease. Electronically Signed   By: Ernie Avena M.D.   On: 02/10/2022 14:43   VAS US CAROTID  Result Date: 02/08/2022 Carotid Arterial Duplex Study Patient Name:  Paula Hansen  Date of Exam:   01/25/2022 Medical Rec #:  [id number]     Accession #:    [id number] Date of Birth: 01-07-1935     Patient Gender: F Patient Age:   49 years Exam Location:  [facility] Procedure:      VAS US CAROTID Referring Phys: Levora Dredge --------------------------------------------------------------------------------  Indications: Carotid artery disease. Performing Technologist: Salvadore Farber RVT  Examination Guidelines: A complete evaluation includes B-mode imaging, spectral Doppler, color Doppler, and power Doppler as needed of all accessible portions of each vessel. Bilateral testing is considered an integral part of a complete examination. Limited examinations for reoccurring indications may be performed as noted.  Right Carotid Findings: +----------+--------+--------+--------+------------------+--------+           PSV cm/sEDV cm/sStenosisPlaque DescriptionComments +----------+--------+--------+--------+------------------+--------+ CCA Prox  84      15                                         +----------+--------+--------+--------+------------------+--------+ CCA Mid   80      16                                         +----------+--------+--------+--------+------------------+--------+ CCA Distal67      16                                         +----------+--------+--------+--------+------------------+--------+ ICA Prox  67      14      1-39%   calcific                   +----------+--------+--------+--------+------------------+--------+ ICA Mid   71      17                                         +----------+--------+--------+--------+------------------+--------+  ICA Distal60      20                                         +----------+--------+--------+--------+------------------+--------+ ECA       103     7                                          +----------+--------+--------+--------+------------------+--------+  +----------+--------+-------+----------------+-------------------+           PSV cm/sEDV cmsDescribe        Arm Pressure (mmHG) +----------+--------+-------+----------------+-------------------+ [id number]            Multiphasic, WNL                    +----------+--------+-------+----------------+-------------------+ +---------+--------+--+--------+-+---------+ VertebralPSV cm/s37EDV cm/s9Antegrade +---------+--------+--+--------+-+---------+  Left Carotid Findings: +----------+--------+--------+--------+------------------+--------+           PSV cm/sEDV cm/sStenosisPlaque DescriptionComments +----------+--------+--------+--------+------------------+--------+ CCA Prox  75      14                                         +----------+--------+--------+--------+------------------+--------+ CCA Mid   81      16                                         +----------+--------+--------+--------+------------------+--------+ CCA Distal60      10                                         +----------+--------+--------+--------+------------------+--------+ ICA Prox  77      26      1-39%   calcific                   +----------+--------+--------+--------+------------------+--------+ ICA Mid   81      24                                         +----------+--------+--------+--------+------------------+--------+ ICA Distal64      21                                         +----------+--------+--------+--------+------------------+--------+ ECA       134     5                                          +----------+--------+--------+--------+------------------+--------+ +----------+--------+--------+----------+-------------------+           PSV cm/sEDV cm/sDescribe  Arm Pressure (mmHG) +----------+--------+--------+----------+-------------------+ [id number]              monophasic                     +----------+--------+--------+----------+-------------------+ +---------+--------+--------+------+ VertebralPSV cm/sEDV cm/sAbsent +---------+--------+--------+------+   Summary: Right Carotid:  Velocities in the right ICA are consistent with a 1-39% stenosis.                Non-hemodynamically significant plaque <50% noted in the CCA. The                ECA appears <50% stenosed. Left Carotid: Velocities in the left ICA are consistent with a 1-39% stenosis.               Non-hemodynamically significant plaque <50% noted in the CCA. The               ECA appears <50% stenosed. Vertebrals:  Right vertebral artery demonstrates antegrade flow. Left vertebral              artery demonstrates an occlusion. Subclavians: Normal flow hemodynamics were seen in the right subclavian artery.              Left SCA is monophasic. *See table(s) above for measurements and observations.  Electronically signed by Levora Dredge MD on 02/08/2022 at 4:33:59 PM.    Final    ECHOCARDIOGRAM COMPLETE  Result Date: 02/06/2022    ECHOCARDIOGRAM REPORT   Patient Name:   Paula Hansen Date of Exam: 02/06/2022 Medical Rec #:  [id number]    Height:       63.0 in Accession #:    [id number]   Weight:       154.5 lb Date of Birth:  11/16/1934    BSA:          1.733 m Patient Age:    87 years     BP:           123/81 mmHg Patient Gender: F            HR:           96 bpm. Exam Location:  [facility] Procedure: 2D Echo Indications:     TIA G45.9  History:         Patient has no prior history of Echocardiogram examinations.  Sonographer:     Overton Mam RDCS Referring Phys:  [id number] MATTHEW M ECKSTAT Diagnosing Phys: Julien Nordmann MD IMPRESSIONS  1. Left ventricular ejection fraction, by estimation, is 60 to 65%. The left ventricle has normal function. The left ventricle has no regional wall motion abnormalities. There is moderate left ventricular hypertrophy. Left ventricular diastolic parameters are consistent with Grade I diastolic  dysfunction (impaired relaxation).  2. Right ventricular systolic function is normal. The right ventricular size is normal.  3. The mitral valve is normal in structure. Mild mitral valve regurgitation. No evidence of mitral stenosis.  4. The aortic valve is normal in structure. Aortic valve regurgitation is not visualized. Aortic valve sclerosis/calcification is present, without any evidence of aortic stenosis.  5. The inferior vena cava is normal in size with greater than 50% respiratory variability, suggesting right atrial pressure of 3 mmHg. FINDINGS  Left Ventricle: Left ventricular ejection fraction, by estimation, is 60 to 65%. The left ventricle has normal function. The left ventricle has no regional wall motion abnormalities. The left ventricular internal cavity size was normal in size. There is  moderate left ventricular hypertrophy. Left ventricular diastolic parameters are consistent with Grade I diastolic dysfunction (impaired relaxation). Right Ventricle: The right ventricular size is normal. No increase in right ventricular wall thickness. Right ventricular systolic function is normal. Left Atrium: Left atrial size was normal in size. Right Atrium: Right atrial size was normal in size. Pericardium: There  is no evidence of pericardial effusion. Mitral Valve: The mitral valve is normal in structure. Mild mitral annular calcification. Mild mitral valve regurgitation. No evidence of mitral valve stenosis. Tricuspid Valve: The tricuspid valve is normal in structure. Tricuspid valve regurgitation is mild . No evidence of tricuspid stenosis. Aortic Valve: The aortic valve is normal in structure. Aortic valve regurgitation is not visualized. Aortic valve sclerosis/calcification is present, without any evidence of aortic stenosis. Aortic valve peak gradient measures 10.2 mmHg. Pulmonic Valve: The pulmonic valve was normal in structure. Pulmonic valve regurgitation is not visualized. No evidence of pulmonic  stenosis. Aorta: The aortic root is normal in size and structure. Venous: The inferior vena cava is normal in size with greater than 50% respiratory variability, suggesting right atrial pressure of 3 mmHg. IAS/Shunts: No atrial level shunt detected by color flow Doppler.  LEFT VENTRICLE PLAX 2D LVIDd:         3.50 cm     Diastology LVIDs:         2.20 cm     LV e' medial:    5.87 cm/s LV PW:         1.60 cm     LV E/e' medial:  25.6 LV IVS:        1.40 cm     LV e' lateral:   5.66 cm/s LVOT diam:     2.00 cm     LV E/e' lateral: 26.5 LV SV:         60 LV SV Index:   35 LVOT Area:     3.14 cm  LV Volumes (MOD) LV vol d, MOD A2C: 47.1 ml LV vol d, MOD A4C: 40.6 ml LV vol s, MOD A2C: 18.5 ml LV vol s, MOD A4C: 13.3 ml LV SV MOD A2C:     28.6 ml LV SV MOD A4C:     40.6 ml LV SV MOD BP:      28.3 ml RIGHT VENTRICLE RV Basal diam:  2.20 cm RV S prime:     13.60 cm/s LEFT ATRIUM             Index        RIGHT ATRIUM          Index LA diam:        2.80 cm 1.62 cm/m   RA Area:     9.34 cm LA Vol (A2C):   46.9 ml 27.07 ml/m  RA Volume:   16.90 ml 9.75 ml/m LA Vol (A4C):   29.6 ml 17.08 ml/m LA Biplane Vol: 39.9 ml 23.03 ml/m  AORTIC VALVE                 PULMONIC VALVE AV Area (Vmax): 1.96 cm     PV Vmax:       1.07 m/s AV Vmax:        160.00 cm/s  PV Peak grad:  4.6 mmHg AV Peak Grad:   10.2 mmHg LVOT Vmax:      99.70 cm/s LVOT Vmean:     67.700 cm/s LVOT VTI:       0.191 m  AORTA Ao Root diam: 3.20 cm Ao Asc diam:  3.20 cm MITRAL VALVE MV Area (PHT): 6.37 cm     SHUNTS MV Decel Time: 119 msec     Systemic VTI:  0.19 m MV E velocity: 150.00 cm/s  Systemic Diam: 2.00 cm Julien Nordmann MD Electronically signed by Julien Nordmann MD Signature Date/Time: 02/06/2022/12:27:25 PM  Final    CT ANGIO HEAD NECK W WO CM W PERF  Result Date: 02/06/2022 CLINICAL DATA:  Code stroke.  MRI demonstrated no acute infarcts. EXAM: CT ANGIOGRAPHY HEAD AND NECK TECHNIQUE: Multidetector CT imaging of the head and neck was performed  using the standard protocol during bolus administration of intravenous contrast. Multiplanar CT image reconstructions and MIPs were obtained to evaluate the vascular anatomy. Carotid stenosis measurements (when applicable) are obtained utilizing NASCET criteria, using the distal internal carotid diameter as the denominator. RADIATION DOSE REDUCTION: This exam was performed according to the departmental dose-optimization program which includes automated exposure control, adjustment of the mA and/or kV according to patient size and/or use of iterative reconstruction technique. CONTRAST:  OMNIPAQUE IOHEXOL 350 MG/ML SOLN COMPARISON:  CT head without contrast 02/05/2022. MR head without contrast 02/06/2022. MRA of the head 07/15/2021. FINDINGS: CTA NECK FINDINGS Aortic arch: Atherosclerotic calcifications are present at the aortic arch and great vessel origins. Common origin of the innominate artery and left common carotid artery is present. High-grade stenosis is present at the origin of the left subclavian artery. The lumen is narrowed to approximately 1 mm. This compares with a more distal vessel of up to 9 mm. No significant stenosis is present at the common origin of the innominate and left common carotid artery. Right carotid system: The right common carotid artery is mildly tortuous some calcification is present at its origin. No significant stenosis is present in the right common carotid artery. Dense calcifications are present along the medial aspect of the right carotid bifurcation and proximal ICA. No significant stenosis of greater than 50% is present. The more distal right common carotid artery demonstrates some distal mural calcification without significant stenosis. Left carotid system: The left common carotid artery demonstrates some proximal mural calcification without significant stenosis. Calcifications are present bifurcation proximal left ICA without a significant stenosis relative to the more  distal vessel. Cervical left ICA is otherwise normal. Vertebral arteries: The right vertebral artery is the dominant vessel. It originates from the subclavian artery. The left vertebral artery is hypoplastic also originated from the left subclavian artery. A 50% stenosis is present in the left vertebral artery at the level C4-5. Left vertebral artery is occluded at the V3 segment at the level of C2. No significant stenosis present on the right. Skeleton: Multilevel degenerative changes are present cervical spine with leftward curvature compensating for rightward curvature in the thoracic spine. No focal osseous lesions are present. Other neck: Soft tissues the neck are otherwise unremarkable. Salivary glands are within normal limits. Thyroid is normal. No significant adenopathy is present. No focal mucosal or submucosal lesions are present. Upper chest: Lung apices demonstrate scattered ground-glass attenuation with some dependent atelectasis. No focal nodule or mass lesion is present. Thoracic inlet is within normal limits. Review of the MIP images confirms the above findings CTA HEAD FINDINGS Anterior circulation: Areas of 50% stenosis are present in the anterior genu of the right cavernous internal carotid artery. No greater stenoses are present on either side. ICA termini are within normal limits bilaterally. Left A1 segment is dominant. The right A1 segment demonstrates a high-grade stenosis or occlusion. There is some contrast in the more distal right A1 segment as seen on the prior MRI. The proximal right ACA vessel is occluded as before. There is some reconstitution of the more distal right ACA. Left ACA demonstrates no significant proximal stenosis or occlusion. The left M1 segment is within normal limits. The previously described irregularity  of the ICA terminus appears to represent a 3 mm ICA terminus aneurysm directed superiorly and laterally. MCA bifurcations are within normal limits bilaterally.  Diffuse irregularity is present within MCA branch vessels bilaterally without a significant proximal stenosis or occlusion. Asymmetric attenuation of left MCA branch vessels is similar to the prior exam. Posterior circulation: Left vertebral artery occlusion is chronic. High-grade stenosis of the distal right V4 segment again noted. The basilar artery is small centrally terminating at the superior cerebellar arteries. High-grade proximal basilar artery again noted as on the prior exam. Both posterior cerebral arteries originate from posterior communicating arteries. Similar branch vessel irregularity is present without a proximal occlusion or high-grade stenosis. Venous sinuses: The dural sinuses are patent. The straight sinus and deep cerebral veins are intact. Cortical veins are within normal limits. No significant vascular malformation is evident. Anatomic variants: Fetal type posterior cerebral arteries. Review of the MIP images confirms the above findings IMPRESSION: 1. 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 2. 50% stenosis of the left vertebral artery at the level of C4-5. 3. 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 With minimal reconstitution at the dural margin in then in occlusion again in the V4 segment. 4. High-grade stenosis or occlusion of the right A1 segment with some contrast in the more distal right A1 segment as seen on the prior MRI. High-grade stenosis or occlusion also in the more distal right ACA, similar the prior exam. 5. Stable high-grade stenosis of the distal right V4 segment. 6. 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 7. 3 mm left ICA terminus aneurysm. This was described previously as irregularity. It appears to be a focal aneurysm, stable 8. 50% stenosis of the anterior genu of the right cavernous internal carotid artery. 9.  Aortic Atherosclerosis (ICD10-I70.0). Electronically Signed   By:  Marin Roberts M.D.   On: 02/06/2022 12:11   MR BRAIN WO CONTRAST  Result Date: 02/06/2022 CLINICAL DATA:  Transient ischemic attack EXAM: MRI HEAD WITHOUT CONTRAST TECHNIQUE: Multiplanar, multiecho pulse sequences of the brain and surrounding structures were obtained without intravenous contrast. COMPARISON:  None Available. FINDINGS: Brain: No acute infarct, mass effect or extra-axial collection. Right cerebellar siderosis. There is multifocal hyperintense T2-weighted signal within the white matter. Generalized volume loss. 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 The midline structures are normal. Vascular: Major flow voids are preserved. Skull and upper cervical spine: Normal calvarium and skull base. Visualized upper cervical spine and soft tissues are normal. Sinuses/Orbits:No paranasal sinus fluid levels or advanced mucosal thickening. No mastoid or middle ear effusion. Normal orbits. IMPRESSION: 1. No acute intracranial abnormality. 2. 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 Electronically Signed   By: Deatra Robinson M.D.   On: 02/06/2022 03:02   CT HEAD CODE STROKE WO CONTRAST  Result Date: 02/05/2022 CLINICAL DATA:  Code stroke.  Expressive aphasia EXAM: CT HEAD WITHOUT CONTRAST TECHNIQUE: Contiguous axial images were obtained from the base of the skull through the vertex without intravenous contrast. RADIATION DOSE REDUCTION: This exam was performed according to the departmental dose-optimization program which includes automated exposure control, adjustment of the mA and/or kV according to patient size and/or use of iterative reconstruction technique. COMPARISON:  None Available. FINDINGS: Brain: There is no mass, hemorrhage or extra-axial collection. The size and configuration of the ventricles and extra-axial CSF spaces are normal. There is hypoattenuation of the periventricular white matter,  most commonly indicating chronic ischemic  microangiopathy. Vascular: No abnormal hyperdensity of the major intracranial arteries or dural venous sinuses. No intracranial atherosclerosis. Skull: The visualized skull base, calvarium and extracranial soft tissues are normal. Sinuses/Orbits: No fluid levels or advanced mucosal thickening of the visualized paranasal sinuses. No mastoid or middle ear effusion. The orbits are normal. ASPECTS [facility]([place] Stroke Program Early CT Score) - Ganglionic level infarction (caudate, lentiform nuclei, internal capsule, insula, M1-M3 cortex): 7 - Supraganglionic infarction (M4-M6 cortex): 3 Total score (0-10 with 10 being normal): 10 IMPRESSION: 1. No acute intracranial abnormality. 2. ASPECTS is 10. These results were called by telephone at the time of interpretation on 02/05/2022 at 9:16 pm to provider Dorothea GlassmanPAUL MALINDA , who verbally acknowledged these results. Electronically Signed   By: Deatra RobinsonKevin  Herman M.D.   On: 02/05/2022 21:17    Microbiology: Recent Results (from the past 240 hour(s))  Resp panel by RT-PCR (RSV, Flu A&B, Covid) Anterior Nasal Swab     Status: None   Collection Time: 02/10/22  3:13 PM   Specimen: Anterior Nasal Swab  Result Value Ref Range Status   SARS Coronavirus 2 by RT PCR NEGATIVE NEGATIVE Final    Comment: (NOTE) SARS-CoV-2 target nucleic acids are NOT DETECTED.  The SARS-CoV-2 RNA is generally detectable in upper respiratory specimens during the acute phase of infection. The lowest concentration of SARS-CoV-2 viral copies this assay can detect is 138 copies/mL. A negative result does not preclude SARS-Cov-2 infection and should not be used as the sole basis for treatment or other patient management decisions. A negative result may occur with  improper specimen collection/handling, submission of specimen other than nasopharyngeal swab, presence of viral mutation(s) within the areas targeted by this assay, and inadequate number of viral copies(<138 copies/mL). A negative result must be  combined with clinical observations, patient history, and epidemiological information. The expected result is Negative.  Fact Sheet for Patients:  BloggerCourse.comhttps://www.fda.gov/media/152166/download  Fact Sheet for Healthcare Providers:  SeriousBroker.ithttps://www.fda.gov/media/152162/download  This test is no t yet approved or cleared by the [place] FDA and  has been authorized for detection and/or diagnosis of SARS-CoV-2 by FDA under an Emergency Use Authorization (EUA). This EUA will remain  in effect (meaning this test can be used) for the duration of the COVID-19 declaration under Section 564(b)(1) of the Act, 21 U.S.C.section 360bbb-3(b)(1), unless the authorization is terminated  or revoked sooner.       Influenza A by PCR NEGATIVE NEGATIVE Final   Influenza B by PCR NEGATIVE NEGATIVE Final    Comment: (NOTE) The Xpert Xpress SARS-CoV-2/FLU/RSV plus assay is intended as an aid in the diagnosis of influenza from Nasopharyngeal swab specimens and should not be used as a sole basis for treatment. Nasal washings and aspirates are unacceptable for Xpert Xpress SARS-CoV-2/FLU/RSV testing.  Fact Sheet for Patients: BloggerCourse.comhttps://www.fda.gov/media/152166/download  Fact Sheet for Healthcare Providers: SeriousBroker.ithttps://www.fda.gov/media/152162/download  This test is not yet approved or cleared by the [place] FDA and has been authorized for detection and/or diagnosis of SARS-CoV-2 by FDA under an Emergency Use Authorization (EUA). This EUA will remain in effect (meaning this test can be used) for the duration of the COVID-19 declaration under Section 564(b)(1) of the Act, 21 U.S.C. section 360bbb-3(b)(1), unless the authorization is terminated or revoked.     Resp Syncytial Virus by PCR NEGATIVE NEGATIVE Final    Comment: (NOTE) Fact Sheet for Patients: BloggerCourse.comhttps://www.fda.gov/media/152166/download  Fact Sheet for Healthcare Providers: SeriousBroker.ithttps://www.fda.gov/media/152162/download  This test is not yet  approved or cleared  by the [place] and has been authorized for detection and/or diagnosis of SARS-CoV-2 by FDA under an Emergency Use Authorization (EUA). This EUA will remain in effect (meaning this test can be used) for the duration of the COVID-19 declaration under Section 564(b)(1) of the Act, 21 U.S.C. section 360bbb-3(b)(1), unless the authorization is terminated or revoked.  Performed at [facility], [address] [place], [place] [address]   Blood culture (routine x 2)     Status: None   Collection Time: 02/10/22  3:13 PM   Specimen: BLOOD  Result Value Ref Range Status   Specimen Description BLOOD BLOOD LEFT ARM  Final   Special Requests   Final    BOTTLES DRAWN AEROBIC AND ANAEROBIC Blood Culture adequate volume   Culture   Final    NO GROWTH 5 DAYS Performed at [facility], [address] [place], [place] [address]    Report Status 02/15/2022 FINAL  Final  Blood culture (routine x 2)     Status: None   Collection Time: 02/10/22  3:17 PM   Specimen: BLOOD  Result Value Ref Range Status   Specimen Description BLOOD BLOOD RIGHT HAND  Final   Special Requests   Final    BOTTLES DRAWN AEROBIC AND ANAEROBIC Blood Culture adequate volume   Culture   Final    NO GROWTH 5 DAYS Performed at [facility], [address] [place], [place] [address]    Report Status 02/15/2022 FINAL  Final  Urine Culture     Status: Abnormal   Collection Time: 02/10/22  6:25 PM   Specimen: Urine, Random  Result Value Ref Range Status   Specimen Description   Final    URINE, RANDOM Performed at [facility], [address] [place], [place] [address]    Special Requests   Final    NONE Performed at [facility], [address] [address] [place], [place] [address]    Culture >=100,000 COLONIES/mL KLEBSIELLA PNEUMONIAE (A)  Final   Report Status 02/13/2022 FINAL  Final   Organism ID, Bacteria KLEBSIELLA PNEUMONIAE (A)  Final       Susceptibility   Klebsiella pneumoniae - MIC*    AMPICILLIN >=32 RESISTANT Resistant     CEFAZOLIN <=4 SENSITIVE Sensitive     CEFEPIME <=0.12 SENSITIVE Sensitive     CEFTRIAXONE <=0.25 SENSITIVE Sensitive     CIPROFLOXACIN <=0.25 SENSITIVE Sensitive     GENTAMICIN <=1 SENSITIVE Sensitive     IMIPENEM 0.5 SENSITIVE Sensitive     NITROFURANTOIN 64 INTERMEDIATE Intermediate     TRIMETH/SULFA <=20 SENSITIVE Sensitive     AMPICILLIN/SULBACTAM 4 SENSITIVE Sensitive     PIP/TAZO <=4 SENSITIVE Sensitive     * >=100,000 COLONIES/mL KLEBSIELLA PNEUMONIAE  MRSA Next Gen by PCR, Nasal     Status: Abnormal   Collection Time: 02/11/22  1:45 AM   Specimen: Nasal Mucosa; Nasal Swab  Result Value Ref Range Status   MRSA by PCR Next Gen DETECTED (A) NOT DETECTED Final    Comment: RESULT CALLED TO, READ BACK BY AND VERIFIED WITH: NALLELY SOSA 02/11/22 0503 MW (NOTE) The GeneXpert MRSA Assay (FDA approved for NASAL specimens only), is one component of a comprehensive MRSA colonization surveillance program. It is not intended to diagnose MRSA infection nor to guide or monitor treatment for MRSA infections. Test performance is not FDA approved in patients less than 82 years old. Performed at [facility], [address], [place], [place] [address]  Labs: Basic Metabolic Panel: Recent Labs  Lab 02/11/22 0753 02/11/22 0754 02/12/22 1313 02/13/22 0510 02/14/22 0553 02/15/22 0609 02/16/22 0429  NA  --    < > 141 142 140 141 138  K  --    < > 4.1 4.0 3.4* 3.4* 3.3*  CL  --    < > 109 107 107 109 108  CO2  --    < > 26 22 22 24 23   GLUCOSE  --    < > 125* 226* 201* 145* 176*  BUN  --    < > 12 89* 56* 35* 32*  CREATININE  --    < > 0.69 5.24* 2.84* 1.63* 1.42*  CALCIUM  --    < > 8.3* 7.7* 8.1* 8.6* 8.7*  MG 1.7  --  2.1 1.7 1.6* 2.2  --   PHOS  --   --   --  5.9* 3.7 3.6  --    < > = values in this interval not displayed.   Liver Function Tests: Recent Labs  Lab  02/11/22 0754 02/12/22 1313 02/14/22 0553 02/15/22 0609  AST 42* 50*  --   --   ALT 21 15  --   --   ALKPHOS 49 63  --   --   BILITOT 0.7 1.0  --   --   PROT 5.7* 6.4*  --   --   ALBUMIN 3.0* 2.8* 2.9* 2.8*   No results for input(s): "LIPASE", "AMYLASE" in the last 168 hours. No results for input(s): "AMMONIA" in the last 168 hours. CBC: Recent Labs  Lab 02/11/22 0500 02/12/22 1337 02/13/22 0510 02/14/22 0553 02/15/22 0609 02/16/22 0429  WBC 16.9* 11.8* 9.0 9.4 8.3 8.6  NEUTROABS 11.9* 8.8* 6.0 6.2  --   --   HGB 8.1* 10.2* 10.1* 10.6* 11.5* 11.3*  HCT 25.5* 30.4* 30.2* 32.2* 34.7* 34.5*  MCV 100.0 94.4 94.7 95.0 96.4 95.8  PLT 269 251 244 254 294 308   Cardiac Enzymes: Recent Labs  Lab 02/10/22 1743  CKTOTAL 501*   BNP: BNP (last 3 results) Recent Labs    02/06/22 0437  BNP 88.4    ProBNP (last 3 results) No results for input(s): "PROBNP" in the last 8760 hours.  CBG: Recent Labs  Lab 02/15/22 1217 02/15/22 1628 02/15/22 2051 02/16/22 0755 02/16/22 1202  GLUCAP 145* 203* 283* 152* 291*       Signed:  02/18/22 MD.  [facility] 02/16/2022, 4:44 PM

## 2022-02-16 NOTE — Progress Notes (Signed)
[facility] [address] [employer], [employer] [facility]) Hospital Liaison note:  This patient was referred to [facility] program on 12.22.23.   Unfortunately, the patient does not meet the criteria for our outpatient program at this time.  Thank you, Abran Cantor, LPN [facility] Liaison [phone]

## 2022-11-11 ENCOUNTER — Emergency Department (HOSPITAL_COMMUNITY)

## 2022-11-11 ENCOUNTER — Inpatient Hospital Stay (HOSPITAL_COMMUNITY)
Admission: EM | Admit: 2022-11-11 | Discharge: 2022-11-15 | DRG: 689 | Disposition: A | Discharge status: Nursing Home (Includes ICF) | Attending: Internal Medicine | Admitting: Internal Medicine

## 2022-11-11 ENCOUNTER — Encounter (HOSPITAL_COMMUNITY): Payer: Self-pay

## 2022-11-11 DIAGNOSIS — Z87891 Personal history of nicotine dependence: Secondary | ICD-10-CM

## 2022-11-11 DIAGNOSIS — E782 Mixed hyperlipidemia: Secondary | ICD-10-CM | POA: Diagnosis present

## 2022-11-11 DIAGNOSIS — G9341 Metabolic encephalopathy: Secondary | ICD-10-CM | POA: Diagnosis present

## 2022-11-11 DIAGNOSIS — Z79890 Hormone replacement therapy (postmenopausal): Secondary | ICD-10-CM | POA: Diagnosis not present

## 2022-11-11 DIAGNOSIS — E039 Hypothyroidism, unspecified: Secondary | ICD-10-CM | POA: Diagnosis present

## 2022-11-11 DIAGNOSIS — N3080 Other cystitis without hematuria: Principal | ICD-10-CM

## 2022-11-11 DIAGNOSIS — Z66 Do not resuscitate: Secondary | ICD-10-CM | POA: Diagnosis present

## 2022-11-11 DIAGNOSIS — Z885 Allergy status to narcotic agent status: Secondary | ICD-10-CM

## 2022-11-11 DIAGNOSIS — Z803 Family history of malignant neoplasm of breast: Secondary | ICD-10-CM | POA: Diagnosis not present

## 2022-11-11 DIAGNOSIS — Z90710 Acquired absence of both cervix and uterus: Secondary | ICD-10-CM

## 2022-11-11 DIAGNOSIS — Z79899 Other long term (current) drug therapy: Secondary | ICD-10-CM | POA: Diagnosis not present

## 2022-11-11 DIAGNOSIS — I129 Hypertensive chronic kidney disease with stage 1 through stage 4 chronic kidney disease, or unspecified chronic kidney disease: Secondary | ICD-10-CM | POA: Diagnosis present

## 2022-11-11 DIAGNOSIS — R55 Syncope and collapse: Secondary | ICD-10-CM

## 2022-11-11 DIAGNOSIS — I10 Essential (primary) hypertension: Secondary | ICD-10-CM | POA: Diagnosis present

## 2022-11-11 DIAGNOSIS — E1122 Type 2 diabetes mellitus with diabetic chronic kidney disease: Secondary | ICD-10-CM | POA: Diagnosis present

## 2022-11-11 DIAGNOSIS — B961 Klebsiella pneumoniae [K. pneumoniae] as the cause of diseases classified elsewhere: Secondary | ICD-10-CM | POA: Diagnosis present

## 2022-11-11 DIAGNOSIS — E875 Hyperkalemia: Secondary | ICD-10-CM | POA: Diagnosis present

## 2022-11-11 DIAGNOSIS — R197 Diarrhea, unspecified: Secondary | ICD-10-CM | POA: Diagnosis present

## 2022-11-11 DIAGNOSIS — Z8673 Personal history of transient ischemic attack (TIA), and cerebral infarction without residual deficits: Secondary | ICD-10-CM

## 2022-11-11 DIAGNOSIS — G9340 Encephalopathy, unspecified: Secondary | ICD-10-CM | POA: Diagnosis present

## 2022-11-11 DIAGNOSIS — Z888 Allergy status to other drugs, medicaments and biological substances status: Secondary | ICD-10-CM

## 2022-11-11 DIAGNOSIS — Z96652 Presence of left artificial knee joint: Secondary | ICD-10-CM | POA: Diagnosis present

## 2022-11-11 DIAGNOSIS — K219 Gastro-esophageal reflux disease without esophagitis: Secondary | ICD-10-CM | POA: Diagnosis present

## 2022-11-11 DIAGNOSIS — R4182 Altered mental status, unspecified: Secondary | ICD-10-CM

## 2022-11-11 DIAGNOSIS — N1831 Chronic kidney disease, stage 3a: Secondary | ICD-10-CM | POA: Diagnosis present

## 2022-11-11 DIAGNOSIS — N183 Chronic kidney disease, stage 3 unspecified: Secondary | ICD-10-CM | POA: Diagnosis present

## 2022-11-11 DIAGNOSIS — Z7984 Long term (current) use of oral hypoglycemic drugs: Secondary | ICD-10-CM | POA: Diagnosis not present

## 2022-11-11 DIAGNOSIS — Z881 Allergy status to other antibiotic agents status: Secondary | ICD-10-CM | POA: Diagnosis not present

## 2022-11-11 DIAGNOSIS — Z91013 Allergy to seafood: Secondary | ICD-10-CM

## 2022-11-11 DIAGNOSIS — E119 Type 2 diabetes mellitus without complications: Secondary | ICD-10-CM

## 2022-11-11 DIAGNOSIS — E1140 Type 2 diabetes mellitus with diabetic neuropathy, unspecified: Secondary | ICD-10-CM | POA: Diagnosis present

## 2022-11-11 DIAGNOSIS — G4700 Insomnia, unspecified: Secondary | ICD-10-CM | POA: Diagnosis present

## 2022-11-11 DIAGNOSIS — F419 Anxiety disorder, unspecified: Secondary | ICD-10-CM | POA: Diagnosis present

## 2022-11-11 DIAGNOSIS — R5383 Other fatigue: Secondary | ICD-10-CM | POA: Diagnosis present

## 2022-11-11 DIAGNOSIS — Z7982 Long term (current) use of aspirin: Secondary | ICD-10-CM

## 2022-11-11 DIAGNOSIS — K8020 Calculus of gallbladder without cholecystitis without obstruction: Secondary | ICD-10-CM | POA: Diagnosis present

## 2022-11-11 DIAGNOSIS — Z823 Family history of stroke: Secondary | ICD-10-CM

## 2022-11-11 LAB — CBC WITH DIFFERENTIAL/PLATELET
Abs Immature Granulocytes: 0.04 10*3/uL (ref 0.00–0.07)
Basophils Absolute: 0 10*3/uL (ref 0.0–0.1)
Basophils Relative: 0 %
Eosinophils Absolute: 0.1 10*3/uL (ref 0.0–0.5)
Eosinophils Relative: 1 %
HCT: 42.5 % (ref 36.0–46.0)
Hemoglobin: 13.6 g/dL (ref 12.0–15.0)
Immature Granulocytes: 0 %
Lymphocytes Relative: 4 %
Lymphs Abs: 0.4 10*3/uL — ABNORMAL LOW (ref 0.7–4.0)
MCH: 30.8 pg (ref 26.0–34.0)
MCHC: 32 g/dL (ref 30.0–36.0)
MCV: 96.4 fL (ref 80.0–100.0)
Monocytes Absolute: 0.7 10*3/uL (ref 0.1–1.0)
Monocytes Relative: 6 %
Neutro Abs: 10.2 10*3/uL — ABNORMAL HIGH (ref 1.7–7.7)
Neutrophils Relative %: 89 %
Platelets: 339 10*3/uL (ref 150–400)
RBC: 4.41 MIL/uL (ref 3.87–5.11)
RDW: 13.4 % (ref 11.5–15.5)
WBC: 11.5 10*3/uL — ABNORMAL HIGH (ref 4.0–10.5)
nRBC: 0 % (ref 0.0–0.2)

## 2022-11-11 LAB — COMPREHENSIVE METABOLIC PANEL
ALT: 11 U/L (ref 0–44)
AST: 15 U/L (ref 15–41)
Albumin: 3.4 g/dL — ABNORMAL LOW (ref 3.5–5.0)
Alkaline Phosphatase: 61 U/L (ref 38–126)
Anion gap: 11 (ref 5–15)
BUN: 24 mg/dL — ABNORMAL HIGH (ref 8–23)
CO2: 22 mmol/L (ref 22–32)
Calcium: 9.2 mg/dL (ref 8.9–10.3)
Chloride: 105 mmol/L (ref 98–111)
Creatinine, Ser: 1.06 mg/dL — ABNORMAL HIGH (ref 0.44–1.00)
GFR, Estimated: 51 mL/min — ABNORMAL LOW (ref >60)
Glucose, Bld: 180 mg/dL — ABNORMAL HIGH (ref 70–99)
Potassium: 5 mmol/L (ref 3.5–5.1)
Sodium: 138 mmol/L (ref 135–145)
Total Bilirubin: 0.9 mg/dL (ref 0.3–1.2)
Total Protein: 7.1 g/dL (ref 6.5–8.1)

## 2022-11-11 LAB — URINALYSIS, ROUTINE W REFLEX MICROSCOPIC
Bilirubin Urine: NEGATIVE
Glucose, UA: NEGATIVE mg/dL
Hgb urine dipstick: NEGATIVE
Ketones, ur: 5 mg/dL — AB
Nitrite: POSITIVE — AB
Protein, ur: NEGATIVE mg/dL
Specific Gravity, Urine: 1.015 (ref 1.005–1.030)
pH: 6 (ref 5.0–8.0)

## 2022-11-11 LAB — GLUCOSE, CAPILLARY: Glucose-Capillary: 100 mg/dL — ABNORMAL HIGH (ref 70–99)

## 2022-11-11 LAB — HEMOGLOBIN A1C
Hgb A1c MFr Bld: 6.4 % — ABNORMAL HIGH (ref 4.8–5.6)
Mean Plasma Glucose: 136.98 mg/dL

## 2022-11-11 LAB — I-STAT CG4 LACTIC ACID, ED: Lactic Acid, Venous: 1.9 mmol/L (ref 0.5–1.9)

## 2022-11-11 LAB — LIPASE, BLOOD: Lipase: 27 U/L (ref 11–51)

## 2022-11-11 LAB — CBG MONITORING, ED: Glucose-Capillary: 185 mg/dL — ABNORMAL HIGH (ref 70–99)

## 2022-11-11 MED ORDER — ACETAMINOPHEN 325 MG PO TABS: 650.0000 mg | ORAL_TABLET | Freq: Four times a day (QID) | ORAL | Status: DC | PRN

## 2022-11-11 MED ORDER — PANTOPRAZOLE SODIUM 40 MG PO TBEC
40.0000 mg | DELAYED_RELEASE_TABLET | Freq: Every day | ORAL | Status: DC
  Administered 2022-11-11 – 2022-11-15 (×5): 40 mg via ORAL
  Filled 2022-11-11 (×5): qty 1

## 2022-11-11 MED ORDER — SODIUM CHLORIDE 0.9 % IV SOLN
1000.0000 mL | INTRAVENOUS | Status: DC
  Administered 2022-11-11: 1000 mL via INTRAVENOUS

## 2022-11-11 MED ORDER — ONDANSETRON HCL 4 MG PO TABS: 4.0000 mg | ORAL_TABLET | Freq: Four times a day (QID) | ORAL | Status: DC | PRN

## 2022-11-11 MED ORDER — GABAPENTIN 100 MG PO CAPS
100.0000 mg | ORAL_CAPSULE | Freq: Two times a day (BID) | ORAL | Status: DC
  Administered 2022-11-11 – 2022-11-15 (×8): 100 mg via ORAL
  Filled 2022-11-11 (×8): qty 1

## 2022-11-11 MED ORDER — GLIPIZIDE ER 10 MG PO TB24
10.0000 mg | ORAL_TABLET | Freq: Every day | ORAL | Status: DC
  Filled 2022-11-11: qty 1

## 2022-11-11 MED ORDER — METFORMIN HCL 500 MG PO TABS: 1000.0000 mg | ORAL_TABLET | Freq: Two times a day (BID) | ORAL | Status: DC

## 2022-11-11 MED ORDER — SERTRALINE HCL 25 MG PO TABS
25.0000 mg | ORAL_TABLET | Freq: Every day | ORAL | Status: DC
  Administered 2022-11-12 – 2022-11-15 (×4): 25 mg via ORAL
  Filled 2022-11-11 (×4): qty 1

## 2022-11-11 MED ORDER — AMLODIPINE BESYLATE 5 MG PO TABS
10.0000 mg | ORAL_TABLET | Freq: Every day | ORAL | Status: DC
  Administered 2022-11-11: 10 mg via ORAL
  Filled 2022-11-11: qty 2

## 2022-11-11 MED ORDER — INSULIN ASPART 100 UNIT/ML IJ SOLN
0.0000 [IU] | Freq: Three times a day (TID) | INTRAMUSCULAR | Status: DC
  Administered 2022-11-13 – 2022-11-15 (×3): 1 [IU] via SUBCUTANEOUS
  Administered 2022-11-15: 2 [IU] via SUBCUTANEOUS
  Filled 2022-11-11: qty 0.09

## 2022-11-11 MED ORDER — SODIUM CHLORIDE 0.9 % IV BOLUS (SEPSIS)
1000.0000 mL | Freq: Once | INTRAVENOUS | Status: AC
  Administered 2022-11-11: 1000 mL via INTRAVENOUS

## 2022-11-11 MED ORDER — CLOPIDOGREL BISULFATE 75 MG PO TABS
75.0000 mg | ORAL_TABLET | Freq: Every day | ORAL | Status: DC
  Administered 2022-11-12 – 2022-11-15 (×4): 75 mg via ORAL
  Filled 2022-11-11 (×4): qty 1

## 2022-11-11 MED ORDER — LEVOTHYROXINE SODIUM 50 MCG PO TABS
50.0000 ug | ORAL_TABLET | Freq: Every day | ORAL | Status: DC
  Administered 2022-11-12 – 2022-11-15 (×4): 50 ug via ORAL
  Filled 2022-11-11 (×4): qty 1

## 2022-11-11 MED ORDER — ACETAMINOPHEN 650 MG RE SUPP: 650.0000 mg | Freq: Four times a day (QID) | RECTAL | Status: DC | PRN

## 2022-11-11 MED ORDER — MIRTAZAPINE 15 MG PO TABS
15.0000 mg | ORAL_TABLET | Freq: Every day | ORAL | Status: DC
  Administered 2022-11-11 – 2022-11-14 (×4): 15 mg via ORAL
  Filled 2022-11-11: qty 1
  Filled 2022-11-11: qty 2
  Filled 2022-11-11 (×2): qty 1

## 2022-11-11 MED ORDER — IOHEXOL 300 MG/ML  SOLN
100.0000 mL | Freq: Once | INTRAMUSCULAR | Status: AC | PRN
  Administered 2022-11-11: 100 mL via INTRAVENOUS

## 2022-11-11 MED ORDER — ZINC OXIDE 20 % EX OINT: 1.0000 | TOPICAL_OINTMENT | Freq: Three times a day (TID) | CUTANEOUS | Status: DC | PRN

## 2022-11-11 MED ORDER — ASPIRIN 81 MG PO TBEC
81.0000 mg | DELAYED_RELEASE_TABLET | Freq: Every day | ORAL | Status: DC
  Administered 2022-11-12 – 2022-11-15 (×4): 81 mg via ORAL
  Filled 2022-11-11 (×4): qty 1

## 2022-11-11 MED ORDER — ONDANSETRON HCL 4 MG/2ML IJ SOLN: 4.0000 mg | Freq: Four times a day (QID) | INTRAMUSCULAR | Status: DC | PRN

## 2022-11-11 MED ORDER — ENOXAPARIN SODIUM 40 MG/0.4ML IJ SOSY
40.0000 mg | PREFILLED_SYRINGE | INTRAMUSCULAR | Status: DC
  Administered 2022-11-11 – 2022-11-14 (×4): 40 mg via SUBCUTANEOUS
  Filled 2022-11-11 (×4): qty 0.4

## 2022-11-11 MED ORDER — SODIUM CHLORIDE 0.9 % IV SOLN
1.0000 g | INTRAVENOUS | Status: DC
  Administered 2022-11-12: 1 g via INTRAVENOUS
  Filled 2022-11-11 (×2): qty 10

## 2022-11-11 MED ORDER — SODIUM CHLORIDE 0.9 % IV SOLN
1000.0000 mL | INTRAVENOUS | Status: AC
  Administered 2022-11-11 (×2): 1000 mL via INTRAVENOUS

## 2022-11-11 MED ORDER — SODIUM CHLORIDE 0.9 % IV SOLN
1.0000 g | Freq: Once | INTRAVENOUS | Status: AC
  Administered 2022-11-11: 1 g via INTRAVENOUS
  Filled 2022-11-11: qty 10

## 2022-11-11 NOTE — ED Triage Notes (Signed)
BIB [facility] from [facility] c/o diarrhea and generalized weakness. Hypotensive when EMS arrived.

## 2022-11-11 NOTE — ED Notes (Signed)
Patient transported to CT 

## 2022-11-11 NOTE — H&P (Signed)
History and Physical    Patient: Paula Hansen DOB: 13-Mar-1934 DOA: 11/11/2022 DOS: the patient was seen and examined on 11/11/2022 PCP: Gracelyn Nurse, MD  Patient coming from: Home  Chief Complaint:  Chief Complaint  Patient presents with   Diarrhea   HPI: Paula Hansen is a 88 y.o. female with medical history significant of osteoarthritis, type 2 diabetes, diabetic neuropathy, GERD, hyperlipidemia, hypertension, hypothyroidism, lumbar stenosis, carotid stenosis, cerebellar hemorrhage, history of sepsis secondary to UTI who was sent to the emergency department with complaints of diarrhea from her nursing facility.  She is oriented to person, she thought she was at [facility], she is disoriented to time, date and situation.  She is unable to provide further information at this time, however she is able to answer simple questions.  She denied headache, abdominal, back or chest pain at the moment.  Lab work: Urine analysis with ketones of 5, positive nitrate, large leukocyte esterase, 21-50 WBC and rare bacteria.  CBC is her white count 11.5, hemoglobin 13.6 g/dL and platelets 528.  Lipase is 27 units/L.  Lactic acid was normal.  CMP showed an albumin of 3.4 g/dL, BUN 24, creatinine 4.13 and glucose 180 mg/deciliter.  The rest of the CMP measurements were normal.  Imaging: CT abdomen/pelvis with contrast showing emphysematous cystitis due to gas-forming infection of the urinary bladder.  There is cholelithiasis without evidence of cholecystitis.   ED course: Initial vital signs were temperature 97.6 F, pulse 93, respiration 12, BP 162/87 mmHg O2 sat 93% on room air.  Patient received ceftriaxone 1 g IVPB 1000 mL of normal saline bolus.  Review of Systems: As mentioned in the history of present illness. All other systems reviewed and are negative.  Past Medical History:  Diagnosis Date   Arthritis    Diabetes mellitus    Diabetic neuropathy (HCC)    GERD  (gastroesophageal reflux disease)    Hypercholesteremia    Hypertension    Hypothyroidism    Lumbar stenosis    L3-L4 left   PONV (postoperative nausea and vomiting)    Wears glasses    Wears partial dentures    lower   Past Surgical History:  Procedure Laterality Date   ABDOMINAL HYSTERECTOMY  1978   COLONOSCOPY     COLONOSCOPY W/ POLYPECTOMY     DILATION AND CURETTAGE OF UTERUS     JOINT REPLACEMENT     left knee October 2012.   KNEE ARTHROSCOPY W/ MENISCAL REPAIR     left knee   LUMBAR LAMINECTOMY/DECOMPRESSION MICRODISCECTOMY  08/17/2011   Procedure: LUMBAR LAMINECTOMY/DECOMPRESSION MICRODISCECTOMY 1 LEVEL;  Surgeon: Clydene Fake, MD;  Location: [facility];  Service: Neurosurgery;  Laterality: Right;  Right Lumbar Two-Three Laminectomy/Diskectomy   LUMBAR LAMINECTOMY/DECOMPRESSION MICRODISCECTOMY Left 10/22/2015   Procedure: Laminectomy and Foraminotomy - Lumbar three-lumbar four, left;  Surgeon: Tia Alert, MD;  Location: [facility];  Service: Neurosurgery;  Laterality: Left;   TOOTH EXTRACTION     Social History:  reports that she has quit smoking. Her smoking use included cigarettes. She has never used smokeless tobacco. She reports that she does not drink alcohol and does not use drugs.  Allergies  Allergen Reactions   Nickel Other (See Comments)    BREAKING OUT IN MOUTH IRRITATION RINGING IN THE EARS   Atorvastatin Other (See Comments)    Muscle Pain   Ezetimibe-Simvastatin Other (See Comments)    MYALGIAS   Pravastatin Other (See Comments)  MYALGIAS   Simvastatin Other (See Comments)    MYALGIAS   Amoxicillin Other (See Comments)    UNSPECIFIED REACTION    Codeine Other (See Comments)    Chest pain   Morphine And Codeine Nausea And Vomiting   Oxycodone Nausea And Vomiting   Shellfish Allergy Nausea And Vomiting   Sulfa Antibiotics Nausea Only and Other (See Comments)    Upset stomach with burning     Family History  Problem Relation Age of  Onset   Breast cancer Maternal Aunt        13's   Stroke Mother    Stroke Father    Cancer - Other Brother    Breast cancer Cousin     Prior to Admission medications   Medication Sig Start Date End Date Taking? Authorizing Provider  acetaminophen (TYLENOL) 325 MG tablet Take 2 tablets (650 mg total) by mouth every 6 (six) hours as needed (Fever >/= 101). 02/16/22   Rodolph Bong, MD  amLODipine (NORVASC) 10 MG tablet Take 1 tablet (10 mg total) by mouth daily. 02/17/22   Rodolph Bong, MD  aspirin EC 81 MG tablet Take 81 mg by mouth every other day.     [provider]  Biotin 2.5 MG TABS Take 2,500 mcg by mouth daily.     [provider]  Calcium Carbonate-Vit D-Min (CALTRATE PLUS PO) Take 1 tablet by mouth daily.    [provider]  cetirizine (ZYRTEC) 10 MG tablet Take 10 mg by mouth daily.    [provider]  Cholecalciferol (VITAMIN D-1000 MAX ST) 1000 units tablet Take 1,000 Units by mouth daily.     [provider]  Coenzyme Q10 10 MG capsule Take 10 mg by mouth daily.    [provider]  esomeprazole (NEXIUM) 40 MG capsule Take 40 mg by mouth daily before breakfast.    [provider]  feeding supplement (ENSURE ENLIVE / ENSURE PLUS) LIQD Take 237 mLs by mouth 2 (two) times daily between meals. 02/17/22   Rodolph Bong, MD  Flaxseed, Linseed, (FLAXSEED OIL) 1000 MG CAPS Take 1,000 mg by mouth daily.    [provider]  gabapentin (NEURONTIN) 100 MG capsule Take 1 capsule (100 mg total) by mouth 2 (two) times daily. 02/16/22   Rodolph Bong, MD  glipiZIDE (GLUCOTROL XL) 10 MG 24 hr tablet Take 10 mg by mouth 2 (two) times daily.  08/02/15   [provider]  levothyroxine (SYNTHROID, LEVOTHROID) 50 MCG tablet Take 50 mcg by mouth daily before breakfast.  08/19/15   [provider]  metFORMIN (GLUCOPHAGE) 500 MG tablet Take 1,000 mg by mouth 2 (two) times daily with a meal.      [provider]  Multiple Vitamin (MULTIVITAMIN WITH MINERALS) TABS Take 1 tablet by mouth daily.    [provider]  ondansetron (ZOFRAN-ODT) 4 MG disintegrating tablet Take 1 tablet (4 mg total) by mouth every 6 (six) hours as needed for nausea. 02/16/22   Rodolph Bong, MD  pioglitazone (ACTOS) 30 MG tablet Take 30 mg by mouth daily. 07/20/21   [provider]  Probiotic Product (ALIGN PO) Take by mouth.    [provider]  rosuvastatin (CRESTOR) 10 MG tablet Take 1 tablet (10 mg total) by mouth daily. 02/17/22   Rodolph Bong, MD  vitamin E 400 UNIT capsule Take 400 Units by mouth daily.    [provider]    Physical Exam: Vitals:  11/11/22 0922 11/11/22 1215 11/11/22 1307  BP: (!) 162/87 (!) 164/76 (!) 164/75  Pulse: 93 82 79  Resp: 12 20 18   Temp: 97.6 F (36.4 C)  98.5 F (36.9 C)  TempSrc: Oral  Oral  SpO2: 93% 95% 93%   Physical Exam Vitals and nursing note reviewed.  Constitutional:      General: She is awake. She is not in acute distress.    Appearance: Normal appearance. She is ill-appearing. She is not toxic-appearing.  HENT:     Head: Normocephalic.     Nose: No rhinorrhea.     Mouth/Throat:     Mouth: Mucous membranes are dry.  Eyes:     General: No scleral icterus.    Pupils: Pupils are equal, round, and reactive to light.  Neck:     Vascular: No JVD.  Cardiovascular:     Rate and Rhythm: Normal rate and regular rhythm.     Heart sounds: S1 normal and S2 normal.  Pulmonary:     Effort: Pulmonary effort is normal.     Breath sounds: Normal breath sounds. No wheezing, rhonchi or rales.  Abdominal:     General: Bowel sounds are normal.     Palpations: Abdomen is soft.  Musculoskeletal:     Cervical back: Neck supple.     Right lower leg: No edema.     Left lower leg: No edema.  Skin:    General: Skin is warm and dry.  Neurological:     General: No focal deficit present.     Mental Status: She is  alert. She is disoriented.  Psychiatric:        Mood and Affect: Mood normal.        Behavior: Behavior normal. Behavior is cooperative.    Data Reviewed:  Results are pending, will review when available.  Assessment and Plan: Principal Problem:   Acute encephalopathy In the setting of:   Emphysematous cystitis Admit to telemetry/inpatient. Gentle, time-limited IV hydration. Continue ceftriaxone 1 g IVPB daily. Follow-up urine culture and sensitivity. Follow-up CBC and chemistry in the morning. Supportive care as needed.  Active Problems:   Type II diabetes mellitus (HCC) Carbohydrate modified diet. Continue glipizide XL 10 mg with breakfast. Continue metformin 1000 mg p.o. twice daily. CBG monitoring with RI SS. Check hemoglobin A1c.    Essential hypertension Continue amlodipine 10 mg p.o. daily.    Chronic kidney disease, stage III (moderate) (HCC) Monitor renal function and electrolytes. Avoid nephrotoxins as possible.    Gastroesophageal reflux disease without esophagitis Continue omeprazole or formulary equivalent.    Mixed hyperlipidemia No longer on rosuvastatin. Follow-up with PCP.    Hypothyroidism Continue levothyroxine 50 mcg p.o. daily.    Insomnia/anxiety Continue mirtazapine 15 mg p.o. bedtime. Continue sertraline 25 mg p.o. daily.    Advance Care Planning:   Code Status: Limited: Do not attempt resuscitation (DNR) -DNR-LIMITED -Do Not Intubate/DNI    Consults:   Family Communication:   Severity of Illness: The appropriate patient status for this patient is INPATIENT. Inpatient status is judged to be reasonable and necessary in order to provide the required intensity of service to ensure the patient's safety. The patient's presenting symptoms, physical exam findings, and initial radiographic and laboratory data in the context of their chronic comorbidities is felt to place them at high risk for further clinical deterioration. Furthermore, it is  not anticipated that the patient will be medically stable for discharge from the hospital within 2 midnights of admission.   *  I certify that at the point of admission it is my clinical judgment that the patient will require inpatient hospital care spanning beyond 2 midnights from the point of admission due to high intensity of service, high risk for further deterioration and high frequency of surveillance required.*  Author: Bobette Mo, MD 11/11/2022 3:15 PM  For on call review [email or web address].   This document was prepared using Dragon voice recognition software and may contain some unintended transcription errors.

## 2022-11-11 NOTE — Progress Notes (Signed)
[facility] liaison note     This patient is a current hospice patient with [facility].   Liaison will continue to follow for any discharge planning needs and to coordinate continuation of hospice care.   Please don't hesitate to call with any Hospice related questions or concerns.    Thank you for the opportunity to participate in this patient's care. Thea Gist, Charity fundraiser, BSN [facility] Liaison [phone]

## 2022-11-11 NOTE — ED Provider Notes (Signed)
[place] EMERGENCY DEPARTMENT AT [facility] Provider Note   CSN: [id number] Arrival date & time: 11/11/22  0913     History  Chief Complaint  Patient presents with   Diarrhea    Paula Hansen is a 88 y.o. female.   Diarrhea    Patient has a history of hypertension arthritis diabetes reflux hypothyroidism lumbar stenosis.  Patient currently resides at a nursing facility.  Patient started having episodes of diarrhea last evening according to the family.  Today she had an episode of nausea vomiting.  Staff at the facility found that she had thrown up.  Patient was being helped to the restroom when she had a syncopal episode.  She was sent to the ED for further evaluation.  Patient is more fatigued and slower to respond than usual according to family.  Patient admits to having abdominal pain earlier although currently is not having any pain.  Unknown if she has been on antibiotics recently  Home Medications Prior to Admission medications   Medication Sig Start Date End Date Taking? Authorizing Provider  acetaminophen (TYLENOL) 325 MG tablet Take 2 tablets (650 mg total) by mouth every 6 (six) hours as needed (Fever >/= 101). 02/16/22   Rodolph Bong, MD  amLODipine (NORVASC) 10 MG tablet Take 1 tablet (10 mg total) by mouth daily. 02/17/22   Rodolph Bong, MD  aspirin EC 81 MG tablet Take 81 mg by mouth every other day.     [provider]  Biotin 2.5 MG TABS Take 2,500 mcg by mouth daily.     [provider]  Calcium Carbonate-Vit D-Min (CALTRATE PLUS PO) Take 1 tablet by mouth daily.    [provider]  cetirizine (ZYRTEC) 10 MG tablet Take 10 mg by mouth daily.    [provider]  Cholecalciferol (VITAMIN D-1000 MAX ST) 1000 units tablet Take 1,000 Units by mouth daily.     [provider]  Coenzyme Q10 10 MG capsule Take 10 mg by mouth daily.    [provider]  esomeprazole (NEXIUM) 40 MG capsule Take 40  mg by mouth daily before breakfast.    [provider]  feeding supplement (ENSURE ENLIVE / ENSURE PLUS) LIQD Take 237 mLs by mouth 2 (two) times daily between meals. 02/17/22   Rodolph Bong, MD  Flaxseed, Linseed, (FLAXSEED OIL) 1000 MG CAPS Take 1,000 mg by mouth daily.    [provider]  gabapentin (NEURONTIN) 100 MG capsule Take 1 capsule (100 mg total) by mouth 2 (two) times daily. 02/16/22   Rodolph Bong, MD  glipiZIDE (GLUCOTROL XL) 10 MG 24 hr tablet Take 10 mg by mouth 2 (two) times daily.  08/02/15   [provider]  levothyroxine (SYNTHROID, LEVOTHROID) 50 MCG tablet Take 50 mcg by mouth daily before breakfast.  08/19/15   [provider]  metFORMIN (GLUCOPHAGE) 500 MG tablet Take 1,000 mg by mouth 2 (two) times daily with a meal.     [provider]  Multiple Vitamin (MULTIVITAMIN WITH MINERALS) TABS Take 1 tablet by mouth daily.    [provider]  ondansetron (ZOFRAN-ODT) 4 MG disintegrating tablet Take 1 tablet (4 mg total) by mouth every 6 (six) hours as needed for nausea. 02/16/22   Rodolph Bong, MD  pioglitazone (ACTOS) 30 MG tablet Take 30 mg by mouth daily. 07/20/21   [provider]  Probiotic Product (ALIGN PO) Take by mouth.    [provider]  rosuvastatin (CRESTOR) 10 MG tablet Take 1 tablet (10 mg total) by mouth daily. 02/17/22   Rodolph Bong, MD  vitamin E 400 UNIT capsule Take 400 Units by mouth daily.    [provider]      Allergies    Nickel, Atorvastatin, Ezetimibe-simvastatin, Pravastatin, Simvastatin, Amoxicillin, Codeine, Morphine and codeine, Oxycodone, Shellfish allergy, and Sulfa antibiotics    Review of Systems   Review of Systems  Gastrointestinal:  Positive for diarrhea.    Physical Exam Updated Vital Signs BP (!) 164/75 Comment: nurse notified  Pulse 79   Temp 98.5 F (36.9 C) (Oral)   Resp 18   SpO2 93%  Physical Exam Vitals and nursing  note reviewed.  Constitutional:      Appearance: She is well-developed. She is not diaphoretic.     Comments: Elderly, frail  HENT:     Head: Normocephalic and atraumatic.     Right Ear: External ear normal.     Left Ear: External ear normal.  Eyes:     General: No scleral icterus.       Right eye: No discharge.        Left eye: No discharge.     Conjunctiva/sclera: Conjunctivae normal.  Neck:     Trachea: No tracheal deviation.  Cardiovascular:     Rate and Rhythm: Normal rate and regular rhythm.  Pulmonary:     Effort: Pulmonary effort is normal. No respiratory distress.     Breath sounds: Normal breath sounds. No stridor. No wheezing or rales.  Abdominal:     General: Bowel sounds are normal. There is no distension.     Palpations: Abdomen is soft.     Tenderness: There is no abdominal tenderness. There is no guarding or rebound.  Musculoskeletal:        General: No tenderness or deformity.     Cervical back: Neck supple.  Skin:    General: Skin is warm and dry.     Coloration: Skin is pale.     Findings: No rash.  Neurological:     General: No focal deficit present.     Cranial Nerves: No cranial nerve deficit, dysarthria or facial asymmetry.     Sensory: No sensory deficit.     Motor: No abnormal muscle tone or seizure activity.     Coordination: Coordination normal.     Comments: Generalized weakness  Psychiatric:        Mood and Affect: Mood normal.     ED Results / Procedures / Treatments   Labs (all labs ordered are listed, but only abnormal results are displayed) Labs Reviewed  COMPREHENSIVE METABOLIC PANEL - Abnormal; Notable for the following components:      Result Value   Glucose, Bld 180 (*)    BUN 24 (*)    Creatinine, Ser 1.06 (*)    Albumin 3.4 (*)    GFR, Estimated 51 (*)    All other components within normal limits  CBC WITH DIFFERENTIAL/PLATELET - Abnormal; Notable for the following components:   WBC 11.5 (*)    Neutro Abs 10.2 (*)     Lymphs Abs 0.4 (*)    All other components within normal limits  URINALYSIS, ROUTINE W REFLEX MICROSCOPIC - Abnormal; Notable for the following components:   Color, Urine STRAW (*)    Ketones, ur 5 (*)    Nitrite POSITIVE (*)    Leukocytes,Ua LARGE (*)    Bacteria, UA RARE (*)    All other components  within normal limits  CBG MONITORING, ED - Abnormal; Notable for the following components:   Glucose-Capillary 185 (*)    All other components within normal limits  C DIFFICILE QUICK SCREEN W PCR REFLEX    URINE CULTURE  LIPASE, BLOOD  I-STAT CG4 LACTIC ACID, ED    EKG None  Radiology CT ABDOMEN PELVIS W CONTRAST  Result Date: 11/11/2022 CLINICAL DATA:  Abdominal pain, acute, nonlocalized.  Diarrhea. EXAM: CT ABDOMEN AND PELVIS WITH CONTRAST TECHNIQUE: Multidetector CT imaging of the abdomen and pelvis was performed using the standard protocol following bolus administration of intravenous contrast. RADIATION DOSE REDUCTION: This exam was performed according to the departmental dose-optimization program which includes automated exposure control, adjustment of the mA and/or kV according to patient size and/or use of iterative reconstruction technique. CONTRAST:  OMNIPAQUE IOHEXOL 300 MG/ML  SOLN COMPARISON:  CT scan abdomen and pelvis from 02/10/2022. FINDINGS: Lower chest: There are subpleural atelectatic changes in the visualized lung bases. No overt consolidation. No pleural effusion. The heart is normal in size. No pericardial effusion. Hepatobiliary: The liver is normal in size. Non-cirrhotic configuration. No suspicious mass. These is mild diffuse hepatic steatosis. No intrahepatic or extrahepatic bile duct dilation. The gallbladder is physiologically distended and exhibits small volume dependent calcified gallstones. No abnormal wall thickening or pericholecystic inflammatory changes. Pancreas: Unremarkable. No pancreatic ductal dilatation or surrounding inflammatory changes. Spleen:  Within normal limits. No focal lesion. Adrenals/Urinary Tract: Stable bilateral adrenal glands. No suspicious renal mass. There is a 1.6 x 2.0 cm simple cyst in the left kidney upper pole. No hydronephrosis. There are stable calcifications in the left kidney upper pole, likely vascular. Otherwise, no renal or ureteric calculi. Small areas of focal scarring noted in bilateral kidneys. Urinary bladder is distended and exhibits air within its wall, compatible with emphysematous cystitis. No focal mass or calculi. Stomach/Bowel: Small to medium diverticulum noted arising from the proximal duodenum. No disproportionate dilation of the small or large bowel loops. No evidence of abnormal bowel wall thickening or inflammatory changes. The appendix is unremarkable. There are multiple diverticula mainly in the left hemi colon, without imaging signs of diverticulitis. Vascular/Lymphatic: No ascites or pneumoperitoneum. No abdominal or pelvic lymphadenopathy, by size criteria. No aneurysmal dilation of the major abdominal arteries. There are marked peripheral atherosclerotic vascular calcifications of the aorta and its major branches. Reproductive: The uterus is surgically absent. No large adnexal mass. Other: There is a tiny fat containing umbilical hernia. The soft tissues and abdominal wall are otherwise unremarkable. Musculoskeletal: No suspicious osseous lesions. There are moderate - marked multilevel degenerative changes in the visualized spine. IMPRESSION: 1. Emphysematous cystitis, which is due to gas-forming infection of the urinary bladder wall. 2. Cholelithiasis. No CT evidence of acute cholecystitis. 3. Multiple other nonacute observations, as described above. Aortic Atherosclerosis (ICD10-I70.0) and Emphysema (ICD10-J43.9). Electronically Signed   By: Jules Schick M.D.   On: 11/11/2022 14:28    Procedures Procedures    Medications Ordered in ED Medications  sodium chloride 0.9 % bolus 1,000 mL (0 mLs  Intravenous Stopped 11/11/22 1311)    Followed by  0.9 %  sodium chloride infusion (1,000 mLs Intravenous New Bag/Given 11/11/22 1121)  iohexol (OMNIPAQUE) 300 MG/ML solution 100 mL (100 mLs Intravenous Contrast Given 11/11/22 1135)  cefTRIAXone (ROCEPHIN) 1 g in sodium chloride 0.9 % 100 mL IVPB (1 g Intravenous New Bag/Given 11/11/22 1310)    ED Course/ Medical Decision Making/ A&P Clinical Course as of 11/11/22 1502  Thu Nov 11, 2022  1125 White blood cell count elevated 11.5.  Metabolic panel shows creatinine increased to 1.0 lactic acid level normal.  Lipase normal [JK]  1246 Urinalysis suggestive of possible UTI [JK]  1434 CT scan shows emphysematous cystitis.  Cholelithiasis noted but no cholecystitis [JK]  1444 Case discussed with the patient's son.  Patient has hospice at home check on her but she is watched at a memory care unit.  She does not typically require intensive nursing care [JK]  1501 Case discussed with Dr Robb Matar [JK]    Clinical Course User Index [JK] Linwood Dibbles, MD                                 Medical Decision Making Problems Addressed: Altered mental status, unspecified altered mental status type: acute illness or injury that poses a threat to life or bodily functions Emphysematous cystitis: acute illness or injury that poses a threat to life or bodily functions Syncope, unspecified syncope type: acute illness or injury that poses a threat to life or bodily functions  Amount and/or Complexity of Data Reviewed Labs: ordered. Decision-making details documented in ED Course. Radiology: ordered and independent interpretation performed.  Risk Prescription drug management. Decision regarding hospitalization.   Patient presented to ED for evaluation of abdominal pain vomiting syncopal episode today.  Patient's laboratory test were notable for leukocytosis.  No anemia noted.  No signs of severe dehydration.  Urinalysis was suggestive of urinary tract infection.   Patient was started on IV antibiotics and IV fluids.  CT scan was performed considering her age and abdominal pain.  CT scan shows findings suggestive of emphysematous cystitis.  No evidence of diverticulitis or other acute intra-abdominal abnormality.  Patient's syncopal episode may have been vasovagal in nature.  Patient is also more listless than usual.  No signs of sepsis at this time however with her age comorbidities and abnormal CT scan findings I will consult medical service for IV antibiotics and observation.  I did discuss this case with the patient's son and he is comfortable with this plan        Final Clinical Impression(s) / ED Diagnoses Final diagnoses:  Emphysematous cystitis  Syncope, unspecified syncope type  Altered mental status, unspecified altered mental status type    Rx / DC Orders ED Discharge Orders     None         Linwood Dibbles, MD 11/11/22 1502

## 2022-11-12 ENCOUNTER — Other Ambulatory Visit: Payer: Self-pay

## 2022-11-12 DIAGNOSIS — N3080 Other cystitis without hematuria: Secondary | ICD-10-CM

## 2022-11-12 LAB — CBC
HCT: 39.2 % (ref 36.0–46.0)
Hemoglobin: 12.6 g/dL (ref 12.0–15.0)
MCH: 30.7 pg (ref 26.0–34.0)
MCHC: 32.1 g/dL (ref 30.0–36.0)
MCV: 95.6 fL (ref 80.0–100.0)
Platelets: 313 10*3/uL (ref 150–400)
RBC: 4.1 MIL/uL (ref 3.87–5.11)
RDW: 13.4 % (ref 11.5–15.5)
WBC: 7.9 10*3/uL (ref 4.0–10.5)
nRBC: 0 % (ref 0.0–0.2)

## 2022-11-12 LAB — MRSA NEXT GEN BY PCR, NASAL: MRSA by PCR Next Gen: DETECTED — AB

## 2022-11-12 LAB — GLUCOSE, CAPILLARY: Glucose-Capillary: 103 mg/dL — ABNORMAL HIGH (ref 70–99)

## 2022-11-12 MED ORDER — LORAZEPAM 0.5 MG PO TABS: 0.5000 mg | ORAL_TABLET | ORAL | Status: DC | PRN

## 2022-11-12 MED ORDER — LORAZEPAM 2 MG/ML IJ SOLN
INTRAMUSCULAR | Status: AC
  Administered 2022-11-12: 0.5 mg via INTRAVENOUS
  Filled 2022-11-12: qty 1

## 2022-11-12 MED ORDER — MUPIROCIN 2 % EX OINT
1.0000 | TOPICAL_OINTMENT | Freq: Two times a day (BID) | CUTANEOUS | Status: DC
  Administered 2022-11-12 – 2022-11-15 (×7): 1 via NASAL
  Filled 2022-11-12 (×3): qty 22

## 2022-11-12 MED ORDER — CHLORHEXIDINE GLUCONATE CLOTH 2 % EX PADS
6.0000 | MEDICATED_PAD | Freq: Every day | CUTANEOUS | Status: DC
  Administered 2022-11-12 – 2022-11-14 (×3): 6 via TOPICAL

## 2022-11-12 MED ORDER — LORAZEPAM 2 MG/ML IJ SOLN
0.5000 mg | INTRAMUSCULAR | Status: DC | PRN
  Administered 2022-11-12: 0.5 mg via INTRAVENOUS
  Filled 2022-11-12: qty 1

## 2022-11-12 MED ORDER — ORAL CARE MOUTH RINSE: 15.0000 mL | OROMUCOSAL | Status: DC | PRN

## 2022-11-12 NOTE — TOC Initial Note (Signed)
Transition of Care [facility]) - Initial/Assessment Note    Patient Details  Name: Paula Hansen MRN: [id number] Date of Birth: 07-08-1934  Transition of Care [facility] CM/SW Contact:    Erin Sons, LCSW Phone Number: 11/12/2022, 1:15 PM  Clinical Narrative:                  CSW was forwarded a message that pt was from [facility] with phone number for andrea, [phone]. CSW called Sue Lush who confirmed pt is a resident with them under hospice services. CSW provided general update. Sue Lush reports she will contact CSW back with fax# for [facility]. TOC will continue to follow to assist with DC needs.  Expected Discharge Plan: Memory Care Barriers to Discharge: Continued Medical Work up   Patient Goals and CMS Choice            Expected Discharge Plan and Services                                              Prior Living Arrangements/Services     Patient language and need for interpreter reviewed:: Yes        Need for Family Participation in Patient Care: Yes (Comment) Care giver support system in place?: Yes (comment)   Criminal Activity/Legal Involvement Pertinent to Current Situation/Hospitalization: No - Comment as needed  Activities of Daily Living Home Assistive Devices/Equipment: Other (Comment) (From [facility]) ADL Screening (condition at time of admission) Patient's cognitive ability adequate to safely complete daily activities?: No Is the patient deaf or have difficulty hearing?: No Does the patient have difficulty seeing, even when wearing glasses/contacts?: No Does the patient have difficulty concentrating, remembering, or making decisions?: Yes Patient able to express need for assistance with ADLs?: Yes Does the patient have difficulty dressing or bathing?: Yes Independently performs ADLs?: No Communication: Needs assistance Toileting: Needs assistance Is this a change from baseline?: Pre-admission baseline In/Out Bed: Needs  assistance Is this a change from baseline?: Pre-admission baseline Walks in Home: Needs assistance Does the patient have difficulty walking or climbing stairs?: Yes Weakness of Legs: Both Weakness of Arms/Hands: None  Permission Sought/Granted                  Emotional Assessment              Admission diagnosis:  Emphysematous cystitis [N30.80] Altered mental status, unspecified altered mental status type [R41.82] Syncope, unspecified syncope type [R55] Patient Active Problem List   Diagnosis Date Noted   Emphysematous cystitis 11/11/2022   NSTEMI (non-ST elevated myocardial infarction) (HCC) 02/16/2022   Sepsis secondary to UTI (HCC) 02/12/2022   Acute renal failure (HCC) 02/12/2022   Sepsis (HCC) 02/10/2022   AKI (acute kidney injury) (HCC) 02/10/2022   Acute encephalopathy 02/10/2022   Hyperkalemia 02/10/2022   Demand ischemia 02/10/2022   Known medical problems 02/06/2022   TIA (transient ischemic attack) 02/05/2022   Carotid stenosis 07/29/2021   Subclavian artery stenosis (HCC) 07/29/2021   Essential hypertension 07/29/2021   Hyperlipidemia 07/29/2021   Diabetes (HCC) 07/29/2021   Closed Colles' fracture 07/06/2021   Swelling of joint of left wrist 06/15/2021   Diabetic polyneuropathy associated with type 2 diabetes mellitus (HCC) 05/28/2020   Nonproliferative diabetic retinopathy of both eyes (HCC) 05/11/2019   Cerebellar hemorrhage (HCC) 07/24/2018   Chronic kidney disease, stage III (moderate) (HCC)  12/22/2017   S/P lumbar laminectomy 10/22/2015   Status post total left knee replacement 12/01/2014   Type II diabetes mellitus (HCC) 08/19/2014   Gastroesophageal reflux disease without esophagitis 02/06/2014   Mixed hyperlipidemia 02/06/2014   Hypothyroidism 08/13/2013   PCP:  Gracelyn Nurse, MD Pharmacy:  No Pharmacies Listed    Social Determinants of Health (SDOH) Social History: SDOH Screenings   Food Insecurity: No Food Insecurity  (11/11/2022)  Housing: Low Risk  (11/11/2022)  Transportation Needs: No Transportation Needs (11/11/2022)  Utilities: Not At Risk (11/11/2022)  Tobacco Use: Medium Risk (11/11/2022)   SDOH Interventions:     Readmission Risk Interventions     No data to display

## 2022-11-12 NOTE — Progress Notes (Signed)
[facility] Progress Note Patient: Paula Hansen QMV:[id number] DOB: December 21, 1934 DOA: 11/11/2022  DOS: the patient was seen and examined on 11/12/2022  Brief hospital course: PMH of type II DM, neuropathy, GERD, HLD, HTN, hypothyroidism, ICH, presented to the hospital with complaints of diarrhea. Reportedly was confused more than her usual.  At baseline oriented to person. Currently being treated for Klebsiella emphysematous UTI. 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 Presents with confusion. Has prior history of UTI and therefore CT abdomen was performed which shows evidence of emphysematous cystitis. Receiving IV ceftriaxone, renally dosed. Received IV fluid as well. No abdominal tenderness at the time of my evaluation. Will monitor response.  Acute metabolic encephalopathy. In the setting of UTI. Currently being treated with IV antibiotic. Will monitor for response. Ativan as needed for agitation.  Diabetes mellitus type 2 with CKD without long-term insulin use. Currently on sliding scale insulin. Monitor.  CKD 3A. GFR 50.  Continue with IV hydration.  Monitor.  HLD. On statin in the past. Monitor.  GERD. On PPI in the past.  Monitor.  Hypothyroidism. On Synthroid.  Currently being continued.  Insomnia and anxiety. On Remeron and Zoloft at home which I will continue.  Goals of care conversation. Patient is active with hospice since December 2023. Currently DNR/DNI. If no improvement in her mentation or medical condition we may need to discuss goals of care to transition to complete comfort for her in the hospital as well.   Subjective: No nausea no vomiting.  Agitated trying to get out of the bed.  No fever no chills.  Physical Exam: General: in Mild distress, No Rash Cardiovascular: S1 and S2 Present, No Murmur Respiratory: Good respiratory effort, Bilateral Air entry present. No Crackles, No wheezes Abdomen: Bowel  Sound present, No tenderness Extremities: No edema Neuro: Alert and disoriented x3, no new focal deficit  Data Reviewed: I have Reviewed nursing notes, Vitals, and Lab results. Since last encounter, pertinent lab results CBC and BMP   . I have ordered test including CBC and BMP  .   Disposition: Status is: Inpatient Remains inpatient appropriate because: Awaiting further workup and improvement in mentation  enoxaparin (LOVENOX) injection 40 mg Start: 11/11/22 2200   Family Communication: No one at bedside Level of care: Telemetry   Vitals:   11/11/22 2133 11/11/22 2210 11/12/22 0145 11/12/22 0648  BP: 137/80 (!) 170/78 122/77 129/87  Pulse: 85 86 (!) 50 98  Resp: 20 15  14   Temp:  98.2 F (36.8 C) 97.7 F (36.5 C) 97.8 F (36.6 C)  TempSrc:  Oral Oral Oral  SpO2: 96% 92% 91% 93%  Weight:      Height:         Author: Lynden Oxford, MD 11/12/2022 7:26 PM  Please look on [email or web address] to find out who is on call.

## 2022-11-12 NOTE — Progress Notes (Signed)
Mobility Specialist - Progress Note   11/12/22 1039  Mobility  Activity Transferred from bed to chair  Level of Assistance Minimal assist, patient does 75% or more  Assistive Device Front wheel walker  Range of Motion/Exercises Active  Activity Response Tolerated well  Mobility Referral Yes  $Mobility charge 1 Mobility  Mobility Specialist Start Time (ACUTE ONLY) 1025  Mobility Specialist Stop Time (ACUTE ONLY) 1039  Mobility Specialist Time Calculation (min) (ACUTE ONLY) 14 min   Pt was found in bed and agreeable to transfer to recliner chair. Was min-A for bed mobility and CG to stand and pivot. Able to take side steps and steps to back into chair. At EOS was left on recliner chair with all needs met. Call bell in reach and chair alarm on.  Billey Chang Mobility Specialist

## 2022-11-12 NOTE — Progress Notes (Signed)
[name] [address] Hospitalized Hospice Patient   [name] is a current hospice patient followed for terminal diagnosis of other symptoms and signs with cognitive functions following other cerebrovascular disease.  Patient is being admitted to [facility] with diagnosis of Acute encephalopathy and emphysematous cystitis.  Per Dr. Patric Dykes, hospice physician, this is a hospice related hospital admission. Facility staff called hospice reporting weakness and decreased responsiveness. Patient was sent to ED for evaluation.  Visited patient in hospital. She is resting comfortably eating breakfast in chair without distress. She is disoriented to place and time but denies complaint.   Patient remains GIP appropriate due to need for close monitoring related to recent fall.  Vital Signs: 97.8/98/14     129/87    O2 93% on RA  I&O:   1144/250  Abnormal labs: BUN 24, Cre 1.06, GFR 51, Alb 3.4, WBC 11.5, UA with leukocytes and positive for nitrite. Urine Culture positive for Klebsiella Pneumoniae  Diagnostics: CT abdomen without significant findings  IV/PRN Meds: NS bolus and then at 125 and 113ml/hr. Currently IV fluids stopped. Rocephin 1G IV x 1  Problem List: Acute encephalopathy In the setting of:   Emphysematous cystitis Admit to telemetry/inpatient. Gentle, time-limited IV hydration. Continue ceftriaxone 1 g IVPB daily. Follow-up urine culture and sensitivity. Follow-up CBC and chemistry in the morning. Supportive care as needed.     Chronic kidney disease, stage III (moderate) (HCC) Monitor renal function and electrolytes. Avoid nephrotoxins as possible.    Discharge Planning: Ongoing  Family Contact: Spoke with son by phone.  IDT: Updated  Goals of Care: DNR   Should patient need ambulance transfer at discharge- please use [facility]) as they contract this service for our active hospice patients.  Glenna Fellows BSN, RN,  [facility] Hospice hospital liaison [phone]

## 2022-11-12 NOTE — Hospital Course (Signed)
Brief hospital course: PMH of type II DM, neuropathy, GERD, HLD, HTN, hypothyroidism, ICH, presented to the hospital with complaints of diarrhea. Reportedly was confused more than her usual.  At baseline oriented to person. Currently being treated for Klebsiella emphysematous UTI. 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 Presents with confusion. Has prior history of UTI and therefore CT abdomen was performed which shows evidence of emphysematous cystitis. Receiving IV ceftriaxone, renally dosed. Received IV fluid as well. No abdominal tenderness at the time of my evaluation. Will monitor response.  Acute metabolic encephalopathy. In the setting of UTI. Currently being treated with IV antibiotic. Will monitor for response. Ativan as needed for agitation.  Diabetes mellitus type 2 with CKD without long-term insulin use. Currently on sliding scale insulin. Monitor.  CKD 3A. GFR 50.  Continue with IV hydration.  Monitor.  HLD. On statin in the past. Monitor.  GERD. On PPI in the past.  Monitor.  Hypothyroidism. On Synthroid.  Currently being continued.  Insomnia and anxiety. On Remeron and Zoloft at home which I will continue.  Goals of care conversation. Patient is active with hospice since December 2023. Currently DNR/DNI. If no improvement in her mentation or medical condition we may need to discuss goals of care to transition to complete comfort for her in the hospital as well. Unable to reach son on the phone.

## 2022-11-12 NOTE — Plan of Care (Signed)
  Problem: Fluid Volume: Goal: Hemodynamic stability will improve Outcome: Progressing   Problem: Clinical Measurements: Goal: Diagnostic test results will improve Outcome: Progressing Goal: Signs and symptoms of infection will decrease Outcome: Progressing   Problem: Respiratory: Goal: Ability to maintain adequate ventilation will improve Outcome: Progressing   Problem: Coping: Goal: Will verbalize positive feelings about self Outcome: Progressing   Problem: Nutrition: Goal: Risk of aspiration will decrease Outcome: Progressing Goal: Dietary intake will improve Outcome: Progressing

## 2022-11-13 DIAGNOSIS — N3080 Other cystitis without hematuria: Secondary | ICD-10-CM | POA: Diagnosis not present

## 2022-11-13 LAB — GLUCOSE, CAPILLARY
Glucose-Capillary: 114 mg/dL — ABNORMAL HIGH (ref 70–99)
Glucose-Capillary: 123 mg/dL — ABNORMAL HIGH (ref 70–99)
Glucose-Capillary: 105 mg/dL — ABNORMAL HIGH (ref 70–99)
Glucose-Capillary: 115 mg/dL — ABNORMAL HIGH (ref 70–99)

## 2022-11-13 LAB — URINE CULTURE: Culture: >=100000 — AB

## 2022-11-13 MED ORDER — LORAZEPAM 0.5 MG PO TABS
0.5000 mg | ORAL_TABLET | Freq: Four times a day (QID) | ORAL | Status: DC | PRN
  Administered 2022-11-13: 0.5 mg via ORAL
  Filled 2022-11-13: qty 1

## 2022-11-13 MED ORDER — CEFAZOLIN SODIUM-DEXTROSE 2-4 GM/100ML-% IV SOLN
2.0000 g | Freq: Three times a day (TID) | INTRAVENOUS | Status: DC
  Administered 2022-11-13 – 2022-11-14 (×3): 2 g via INTRAVENOUS
  Filled 2022-11-13 (×3): qty 100

## 2022-11-13 NOTE — Progress Notes (Signed)
[facility] Progress Note Patient: Paula Hansen ZOX:[id number] DOB: 1934-05-12 DOA: 11/11/2022  DOS: the patient was seen and examined on 11/13/2022  Brief hospital course: PMH of type II DM, neuropathy, GERD, HLD, HTN, hypothyroidism, ICH, presented to the hospital with complaints of diarrhea. Reportedly was confused more than her usual.  At baseline oriented to person. Currently being treated for Klebsiella emphysematous UTI. 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 Presents with confusion. Has prior history of UTI and therefore CT abdomen was performed which shows evidence of emphysematous cystitis. Receiving IV ceftriaxone, renally dosed. Received IV fluid as well. No abdominal tenderness at the time of my evaluation. Will monitor response.  Acute metabolic encephalopathy. In the setting of UTI. Currently being treated with IV antibiotic. Will monitor for response. Ativan as needed for agitation.  Diabetes mellitus type 2 with CKD without long-term insulin use. Currently on sliding scale insulin. Monitor.  CKD 3A. GFR 50.  Continue with IV hydration.  Monitor.  HLD. On statin in the past. Monitor.  GERD. On PPI in the past.  Monitor.  Hypothyroidism. On Synthroid.  Currently being continued.  Insomnia and anxiety. On Remeron and Zoloft at home which I will continue.  Goals of care conversation. Patient is active with hospice since December 2023. Currently DNR/DNI. If no improvement in her mentation or medical condition we may need to discuss goals of care to transition to complete comfort for her in the hospital as well. Unable to reach son on the phone.   Subjective: No nausea vomiting fever or chills.  Ongoing confusion but no abdominal pain.  Physical Exam: General: in Mild distress, No Rash Cardiovascular: S1 and S2 Present, No Murmur Respiratory: Good respiratory effort, Bilateral Air entry present. No Crackles,  No wheezes Abdomen: Bowel Sound present, No tenderness Extremities: No edema Neuro: Alert and oriented place and time, no new focal deficit Place and time Data Reviewed: I have Reviewed nursing notes, Vitals, and Lab results. I have ordered test including CBC and BMP  .   Disposition: Status is: Inpatient Remains inpatient appropriate because: Awaiting improvement in mentation and availability of son to discuss follow-up care.  enoxaparin (LOVENOX) injection 40 mg Start: 11/11/22 2200   Family Communication: No one at bedside.  Unable to reach son on the phone. Level of care: Med-Surg   Vitals:   11/13/22 0500 11/13/22 0937 11/13/22 1233 11/13/22 1257  BP:  111/79 113/80   Pulse:  83 (!) 58   Resp:   16   Temp:   98.4 F (36.9 C)   TempSrc:   Oral   SpO2: 94% 92% (!) 86% 97%  Weight:      Height:         Author: Lynden Oxford, MD 11/13/2022 8:09 PM  Please look on [email or web address] to find out who is on call.

## 2022-11-13 NOTE — Progress Notes (Signed)
Mobility Specialist - Progress Note   11/13/22 1156  Mobility  Activity Transferred from bed to chair  Level of Assistance Minimal assist, patient does 75% or more  Assistive Device Front wheel walker  Range of Motion/Exercises Active Assistive  Activity Response Tolerated fair  $Mobility charge 1 Mobility  Mobility Specialist Start Time (ACUTE ONLY) 1147  Mobility Specialist Stop Time (ACUTE ONLY) 1156  Mobility Specialist Time Calculation (min) (ACUTE ONLY) 9 min   Pt was found in bed and agreeable to transfer to recliner chair. No complaints with session. At EOS was left on recliner chair with all needs met. Call bell in reach and chair alarm on.  Billey Chang Mobility Specialist

## 2022-11-13 NOTE — Progress Notes (Signed)
Civil engineer, contracting Hospitalized Hospice Patient   Paula Hansen is a current hospice patient followed for terminal diagnosis of other symptoms and signs with cognitive functions following other cerebrovascular disease.  Patient is being admitted to [facility] with diagnosis of Acute encephalopathy and emphysematous cystitis.  Per Dr. Patric Dykes, hospice physician, this is a hospice related hospital admission. Facility staff called hospice reporting weakness and decreased responsiveness. Patient was sent to ED for evaluation.   Patient is laying in bed awake. She is fidgeting with her blankets and states that she is tired. Earlier today she worked with the Network engineer. Per MD, plan is likely to dc tomorrow due to her getting a dose of IV ativan last night for agitation. No family present bedside   Patient remains GIP appropriate due to need for close monitoring related to recent fall.   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 Presents with confusion. Has prior history of UTI and therefore CT abdomen was performed which shows evidence of emphysematous cystitis. Receiving IV ceftriaxone, renally dosed. Received IV fluid as well. No abdominal tenderness at the time of my evaluation. Will monitor response.   Acute metabolic encephalopathy. In the setting of UTI. Currently being treated with IV antibiotic. Will monitor for response. Ativan as needed for agitation.   Diabetes mellitus type 2 with CKD without long-term insulin use. Currently on sliding scale insulin. Monitor.   CKD 3A. GFR 50.  Continue with IV hydration.  Monitor.   HLD. On statin in the past. Monitor.    GERD. On PPI in the past.  Monitor.   Hypothyroidism. On Synthroid.  Currently being continued.   Insomnia and anxiety. On Remeron and Zoloft at home which I will continue.   Goals of care conversation. Patient is active with hospice since December 2023. Currently DNR/DNI. If no improvement in her mentation or medical condition we may need to discuss goals of care to transition to complete comfort for her in the hospital as well.     Discharge Planning: Ongoing   Family Contact: Spoke with son by phone.   IDT: Updated   Goals of Care: DNR    Should patient need ambulance transfer at discharge- please use [facility]) as they contract this service for our active hospice patients.  Please call with any questions or concerns. Thank you   Dionicio Stall, LCSW [facility] hospital liaison [phone]

## 2022-11-14 DIAGNOSIS — N3080 Other cystitis without hematuria: Secondary | ICD-10-CM | POA: Diagnosis not present

## 2022-11-14 LAB — GLUCOSE, CAPILLARY
Glucose-Capillary: 127 mg/dL — ABNORMAL HIGH (ref 70–99)
Glucose-Capillary: 109 mg/dL — ABNORMAL HIGH (ref 70–99)
Glucose-Capillary: 107 mg/dL — ABNORMAL HIGH (ref 70–99)
Glucose-Capillary: 175 mg/dL — ABNORMAL HIGH (ref 70–99)

## 2022-11-14 MED ORDER — CEFADROXIL 500 MG PO CAPS
1000.0000 mg | ORAL_CAPSULE | Freq: Two times a day (BID) | ORAL | Status: DC
  Administered 2022-11-14 – 2022-11-15 (×3): 1000 mg via ORAL
  Filled 2022-11-14 (×4): qty 2

## 2022-11-14 NOTE — Plan of Care (Signed)
  Problem: Clinical Measurements: Goal: Diagnostic test results will improve Outcome: Progressing Goal: Signs and symptoms of infection will decrease Outcome: Progressing   Problem: Respiratory: Goal: Ability to maintain adequate ventilation will improve Outcome: Progressing   Problem: Health Behavior/Discharge Planning: Goal: Ability to manage health-related needs will improve Outcome: Progressing

## 2022-11-14 NOTE — Progress Notes (Signed)
Mobility Specialist - Progress Note   11/14/22 1136  Mobility  Activity Transferred from bed to chair  Level of Assistance Moderate assist, patient does 50-74%  Assistive Device Front wheel walker  Distance Ambulated (ft) 6 ft  Range of Motion/Exercises Active  Activity Response Tolerated well  Mobility Referral Yes  $Mobility charge 1 Mobility  Mobility Specialist Start Time (ACUTE ONLY) 1120  Mobility Specialist Stop Time (ACUTE ONLY) 1135  Mobility Specialist Time Calculation (min) (ACUTE ONLY) 15 min   Pt received in bed and agreed to transfer to chair.   Pt was Mod A for bed mobility, Min A for STS. Once standing, pt required lots of cues for when and where to step.  Pt landed in recliner with all needs met, alarm on.  Marilynne Halsted Mobility Specialist

## 2022-11-14 NOTE — Progress Notes (Signed)
[facility] Progress Note Patient: Paula Hansen [id number]:[id number] DOB: 21-Apr-1934 DOA: 11/11/2022  DOS: the patient was seen and examined on 11/14/2022  Brief hospital course: PMH of type II DM, neuropathy, GERD, HLD, HTN, hypothyroidism, ICH, presented to the hospital with complaints of diarrhea. Reportedly was confused more than her usual.  At baseline oriented to person. Currently being treated for Klebsiella emphysematous UTI. 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 Presents with confusion. Has prior history of UTI and therefore CT abdomen was performed which shows evidence of emphysematous cystitis. Receiving IV ceftriaxone, renally dosed. Received IV fluid as well. No abdominal tenderness at the time of my evaluation. Now on oral antibiotic Will monitor response.  Acute metabolic encephalopathy. In the setting of UTI. Currently being treated with IV antibiotic. Will monitor for response. Ativan as needed for agitation.  Diabetes mellitus type 2 with CKD without long-term insulin use. Currently on sliding scale insulin. Monitor.  CKD 3A. GFR 50.  Continue with IV hydration.  Monitor.  HLD. On statin in the past. Monitor.  GERD. On PPI in the past.  Monitor.  Hypothyroidism. On Synthroid.  Currently being continued.  Insomnia and anxiety. On Remeron and Zoloft at home which I will continue.  Goals of care conversation. Patient is active with hospice since December 2023. Currently DNR/DNI. If no improvement in her mentation or medical condition we may need to discuss goals of care to transition to complete comfort for her in the hospital as well. Discussed with son on the phone.  Continue hospice for now.  Recommended the patient will be at risk for further recurrent infection and treatment may not be appropriate answer for someone on hospice.   Subjective: No nausea no vomiting no fever no chills.  No other acute  complaint.  Physical Exam: General: in Mild distress, No Rash Cardiovascular: S1 and S2 Present, No Murmur Respiratory: Good respiratory effort, Bilateral Air entry present. No Crackles, No wheezes Abdomen: Bowel Sound present, No tenderness Extremities: No edema Neuro: Alert and oriented x3, no new focal deficit  Data Reviewed: I have Reviewed nursing notes, Vitals, and Lab results. Since last encounter, pertinent lab results CBC and BMP   .   Disposition: Status is: Inpatient Remains inpatient appropriate because: Awaiting placement.  Facility requested transfer on Monday.  enoxaparin (LOVENOX) injection 40 mg Start: 11/11/22 2200   Family Communication: No one at bedside discussed with son on the phone Level of care: Med-Surg   Vitals:   11/13/22 1257 11/13/22 2021 11/14/22 0623 11/14/22 1411  BP:  (!) 129/105 107/80 (!) 110/32  Pulse:  90 82 90  Resp:  20 18 18   Temp:  98 F (36.7 C) (!) 97.5 F (36.4 C) (!) 97.5 F (36.4 C)  TempSrc:  Oral Oral Oral  SpO2: 97% 91% 94% 94%  Weight:      Height:         Author: Lynden Oxford, MD 11/14/2022 6:48 PM  Please look on [email or web address] to find out who is on call.

## 2022-11-14 NOTE — TOC Progression Note (Signed)
Transition of Care [facility]) - Progression Note    Patient Details  Name: Paula Hansen MRN: [id number] Date of Birth: 06/18/1934  Transition of [facility]) CM/SW Contact  Adrian Prows, RN Phone Number: 11/14/2022, 12:38 PM  Clinical Narrative:    Possible d/c today; pt from [facility]; spoke w/ Sue Lush [phone]) at facility; she says it would be best if pt returns to facility tomorrow so that RN can complete assessment; pt is [facility] by [facility]; Dr Allena Katz and Dionicio Stall, hospital liaison notified; [facility] will follow.    Expected Discharge Plan: Memory Care Barriers to Discharge: Continued Medical Work up  Expected Discharge Plan and Services                                               Social Determinants of Health (SDOH) Interventions SDOH Screenings   Food Insecurity: No Food Insecurity (11/11/2022)  Housing: Low Risk  (11/11/2022)  Transportation Needs: No Transportation Needs (11/11/2022)  Utilities: Not At Risk (11/11/2022)  Tobacco Use: Medium Risk (11/11/2022)    Readmission Risk Interventions     No data to display

## 2022-11-15 DIAGNOSIS — N3080 Other cystitis without hematuria: Secondary | ICD-10-CM | POA: Diagnosis not present

## 2022-11-15 LAB — GLUCOSE, CAPILLARY
Glucose-Capillary: 144 mg/dL — ABNORMAL HIGH (ref 70–99)
Glucose-Capillary: 194 mg/dL — ABNORMAL HIGH (ref 70–99)

## 2022-11-15 MED ORDER — CEFADROXIL 500 MG PO CAPS: 1000.0000 mg | ORAL_CAPSULE | Freq: Two times a day (BID) | ORAL | 0 refills | Status: AC

## 2022-11-15 NOTE — NC FL2 (Signed)
[place] MEDICAID FL2 LEVEL OF CARE FORM     IDENTIFICATION  Patient Name: Rawan Mister Wilson Birthdate: [birth year] Sex: female Admission Date (Current Location): 11/11/2022  [facility] and [place] Number:  Producer, television/film/video and Address:  [facility],  [address] [place], [place] [address]      Provider Number: [id number]  Attending Physician Name and Address:  Rolly Salter, MD  Relative Name and Phone Number:  Kashlynn, Nazzal [phone]    Current Level of Care: Hospital Recommended Level of Care: Assisted Living Facility, Memory Care Prior Approval Number:    Date Approved/Denied:   PASRR Number:    Discharge Plan: Domiciliary (Rest home) [facility])    Current Diagnoses: Patient Active Problem List   Diagnosis Date Noted   Emphysematous cystitis 11/11/2022   NSTEMI (non-ST elevated myocardial infarction) (HCC) 02/16/2022   Sepsis secondary to UTI (HCC) 02/12/2022   Acute renal failure (HCC) 02/12/2022   Sepsis (HCC) 02/10/2022   AKI (acute kidney injury) (HCC) 02/10/2022   Acute encephalopathy 02/10/2022   Hyperkalemia 02/10/2022   Demand ischemia 02/10/2022   Known medical problems 02/06/2022   TIA (transient ischemic attack) 02/05/2022   Carotid stenosis 07/29/2021   Subclavian artery stenosis (HCC) 07/29/2021   Essential hypertension 07/29/2021   Hyperlipidemia 07/29/2021   Diabetes (HCC) 07/29/2021   Closed Colles' fracture 07/06/2021   Swelling of joint of left wrist 06/15/2021   Diabetic polyneuropathy associated with type 2 diabetes mellitus (HCC) 05/28/2020   Nonproliferative diabetic retinopathy of both eyes (HCC) 05/11/2019   Cerebellar hemorrhage (HCC) 07/24/2018   Chronic kidney disease, stage III (moderate) (HCC) 12/22/2017   S/P lumbar laminectomy 10/22/2015   Status post total left knee replacement 12/01/2014   Type II diabetes mellitus (HCC) 08/19/2014   Gastroesophageal reflux disease without esophagitis  02/06/2014   Mixed hyperlipidemia 02/06/2014   Hypothyroidism 08/13/2013    Orientation RESPIRATION BLADDER Height & Weight     Self  O2 (1L nasal cannula for comfort) Continent Weight: 160 lb (72.6 kg) Height:  5\' 4"  (162.6 cm)  BEHAVIORAL SYMPTOMS/MOOD NEUROLOGICAL BOWEL NUTRITION STATUS      Continent Diet  AMBULATORY STATUS COMMUNICATION OF NEEDS Skin   Supervision Verbally Normal                       Personal Care Assistance Level of Assistance  Bathing, Feeding, Dressing Bathing Assistance: Limited assistance Feeding assistance: Independent Dressing Assistance: Limited assistance     Functional Limitations Info  Sight, Speech, Hearing Sight Info: Adequate Hearing Info: Adequate Speech Info: Adequate    SPECIAL CARE FACTORS FREQUENCY                       Contractures Contractures Info: Not present    Additional Factors Info  Code Status, Allergies, Psychotropic Code Status Info: DNR Allergies Info: Nickel  Atorvastatin  Ezetimibe-simvastatin  Pravastatin  Simvastatin  Amoxicillin  Codeine  Morphine And Codeine  Oxycodone  Shellfish Allergy  Sulfa Antibiotics Psychotropic Info: sertraline (ZOLOFT) tablet 25 mg         Current Medications (11/15/2022):  This is the current hospital active medication list Current Facility-Administered Medications  Medication Dose Route Frequency Provider Last Rate Last Admin   acetaminophen (TYLENOL) tablet 650 mg  650 mg Oral Q6H PRN Bobette Mo, MD       Or   acetaminophen (TYLENOL) suppository 650 mg  650 mg Rectal Q6H PRN Robb Matar,  Ernestene Kiel, MD       aspirin EC tablet 81 mg  81 mg Oral Daily Bobette Mo, MD   81 mg at 11/15/22 1026   cefadroxil (DURICEF) capsule 1,000 mg  1,000 mg Oral BID Rolly Salter, MD   1,000 mg at 11/15/22 1030   Chlorhexidine Gluconate Cloth 2 % PADS 6 each  6 each Topical Q0600 Rolly Salter, MD   6 each at 11/14/22 [phone]   clopidogrel (PLAVIX) tablet 75 mg  75 mg  Oral Daily Bobette Mo, MD   75 mg at 11/15/22 1026   enoxaparin (LOVENOX) injection 40 mg  40 mg Subcutaneous Q24H Bobette Mo, MD   40 mg at 11/14/22 2317   gabapentin (NEURONTIN) capsule 100 mg  100 mg Oral BID Bobette Mo, MD   100 mg at 11/15/22 1026   insulin aspart (novoLOG) injection 0-9 Units  0-9 Units Subcutaneous TID WC Bobette Mo, MD   1 Units at 11/15/22 0857   levothyroxine (SYNTHROID) tablet 50 mcg  50 mcg Oral QAC breakfast Bobette Mo, MD   50 mcg at 11/15/22 0602   LORazepam (ATIVAN) tablet 0.5 mg  0.5 mg Oral Q6H PRN Rolly Salter, MD   0.5 mg at 11/13/22 1835   mirtazapine (REMERON) tablet 15 mg  15 mg Oral QHS Bobette Mo, MD   15 mg at 11/14/22 2317   mupirocin ointment (BACTROBAN) 2 % 1 Application  1 Application Nasal BID Rolly Salter, MD   1 Application at 11/15/22 1027   ondansetron (ZOFRAN) tablet 4 mg  4 mg Oral Q6H PRN Bobette Mo, MD       Or   ondansetron [facility]) injection 4 mg  4 mg Intravenous Q6H PRN Bobette Mo, MD       Oral care mouth rinse  15 mL Mouth Rinse PRN Rolly Salter, MD       pantoprazole (PROTONIX) EC tablet 40 mg  40 mg Oral Daily Bobette Mo, MD   40 mg at 11/15/22 1027   sertraline (ZOLOFT) tablet 25 mg  25 mg Oral Daily Bobette Mo, MD   25 mg at 11/15/22 1032   zinc oxide 20 % ointment 1 Application  1 Application Topical TID PRN Bobette Mo, MD         Discharge Medications: TAKE these medications     acetaminophen 325 MG tablet Commonly known as: TYLENOL Take 2 tablets (650 mg total) by mouth every 6 (six) hours as needed (Fever >/= 101). What changed:  when to take this reasons to take this    amLODipine 10 MG tablet Commonly known as: NORVASC Take 1 tablet (10 mg total) by mouth daily.    aspirin EC 81 MG tablet Take 81 mg by mouth in the morning.    cefadroxil 500 MG capsule Commonly known as: DURICEF Take 2 capsules (1,000  mg total) by mouth 2 (two) times daily for 4 days.    clopidogrel 75 MG tablet Commonly known as: PLAVIX Take 75 mg by mouth in the morning.    feeding supplement Liqd Take 237 mLs by mouth 2 (two) times daily between meals.    gabapentin 100 MG capsule Commonly known as: NEURONTIN Take 1 capsule (100 mg total) by mouth 2 (two) times daily.    glipiZIDE 10 MG 24 hr tablet Commonly known as: GLUCOTROL XL Take 10 mg by mouth daily with breakfast.  levothyroxine 50 MCG tablet Commonly known as: SYNTHROID Take 50 mcg by mouth daily before breakfast.    metFORMIN 500 MG tablet Commonly known as: GLUCOPHAGE Take 1,000 mg by mouth 2 (two) times daily.    mirtazapine 15 MG tablet Commonly known as: REMERON Take 15 mg by mouth at bedtime.    omeprazole 20 MG capsule Commonly known as: PRILOSEC Take 20 mg by mouth daily before breakfast.    ondansetron 4 MG disintegrating tablet Commonly known as: ZOFRAN-ODT Take 1 tablet (4 mg total) by mouth every 6 (six) hours as needed for nausea.    ondansetron 4 MG tablet Commonly known as: ZOFRAN Take 4 mg by mouth every 6 (six) hours as needed for nausea.    rosuvastatin 10 MG tablet Commonly known as: CRESTOR Take 1 tablet (10 mg total) by mouth daily.    sertraline 25 MG tablet Commonly known as: ZOLOFT Take 25 mg by mouth in the morning.    zinc oxide 20 % ointment Apply 1 Application topically 3 (three) times daily as needed (for skin breakdown- buttocks/sacrum area).    Relevant Imaging Results:  Relevant Lab Results:   Additional Information SSN [id number]  Darleene Cleaver, LCSW

## 2022-11-15 NOTE — Discharge Summary (Signed)
Physician Discharge Summary   Patient: Paula Hansen MRN: [id number] DOB: Jul 02, 1934  Admit date:     11/11/2022  Discharge date: 11/15/22  Discharge Physician: Lynden Oxford  PCP: Gracelyn Nurse, MD  Recommendations at discharge:  Follow up with PCP   Follow-up Information     Gracelyn Nurse, MD. Schedule an appointment as soon as possible for a visit in 1 week(s).   Specialty: Internal Medicine Contact information: [address] [place] [address] [phone]                Discharge Diagnoses: Principal Problem:   Emphysematous cystitis Active Problems:   Acute encephalopathy   Type II diabetes mellitus (HCC)   Hypothyroidism   Essential hypertension   Chronic kidney disease, stage III (moderate) (HCC)   Gastroesophageal reflux disease without esophagitis   Mixed hyperlipidemia  Hospital Course: Brief hospital course: PMH of type II DM, neuropathy, GERD, HLD, HTN, hypothyroidism, ICH, presented to the hospital with complaints of diarrhea. Reportedly was confused more than her usual.  At baseline oriented to person. Currently being treated for Klebsiella emphysematous UTI. 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 Presents with confusion. Has prior history of UTI and therefore CT abdomen was performed which shows evidence of emphysematous cystitis. Receiving IV ceftriaxone, renally dosed. Received IV fluid as well. No abdominal tenderness at the time of my evaluation. Now on oral antibiotic Will monitor response.  Acute metabolic encephalopathy. In the setting of UTI. Was  being treated with IV antibiotic. Ativan as needed for agitation.  Diabetes mellitus type 2 with CKD without long-term insulin use. Currently on sliding scale insulin. Resume home regimen  Monitor.  CKD 3A. GFR 50.  Treated with IV hydration.  Monitor.  HLD. On statin in the past. Monitor.  GERD. On PPI in the past.   Monitor.  Hypothyroidism. On Synthroid.  Currently being continued.  Insomnia and anxiety. On Remeron and Zoloft at home which I will continue.  Goals of care conversation. Patient is active with hospice since December 2023. Currently DNR/DNI. If no improvement in her mentation or medical condition we may need to discuss goals of care to transition to complete comfort for her in the hospital as well. Discussed with son on the phone.  Continue hospice for now.  Recommended the patient will be at risk for further recurrent infection and treatment may not be appropriate answer for someone on hospice.  Consultants:  none  Procedures performed:  none  DISCHARGE MEDICATION: Allergies as of 11/15/2022       Reactions   Nickel Rash, Other (See Comments)   BREAKING OUT IN MOUTH IRRITATION RINGING IN THE EARS   Atorvastatin Other (See Comments)   Muscle Pain and "ALLERGIC," per MAR   Ezetimibe-simvastatin Other (See Comments)   MYALGIAS and "ALLERGIC," per MAR   Pravastatin Other (See Comments)   MYALGIAS and "ALLERGIC," per MAR   Simvastatin Other (See Comments)   MYALGIAS and "ALLERGIC," per MAR   Amoxicillin Other (See Comments)   "ALLERGIC," per MAR   Codeine Other (See Comments)   Chest pain and "ALLERGIC," per MAR   Morphine And Codeine Nausea And Vomiting, Other (See Comments)   "ALLERGIC," per MAR   Oxycodone Nausea And Vomiting, Other (See Comments)   "ALLERGIC," per MAR   Shellfish Allergy Nausea And Vomiting, Other (See Comments)   "ALLERGIC," per MAR   Sulfa Antibiotics Nausea Only, Other (See Comments)   Upset stomach with burning- "ALLERGIC,"  per [facility]        Medication List     TAKE these medications    acetaminophen 325 MG tablet Commonly known as: TYLENOL Take 2 tablets (650 mg total) by mouth every 6 (six) hours as needed (Fever >/= 101). What changed:  when to take this reasons to take this   amLODipine 10 MG tablet Commonly known as:  NORVASC Take 1 tablet (10 mg total) by mouth daily.   aspirin EC 81 MG tablet Take 81 mg by mouth in the morning.   cefadroxil 500 MG capsule Commonly known as: DURICEF Take 2 capsules (1,000 mg total) by mouth 2 (two) times daily for 4 days.   clopidogrel 75 MG tablet Commonly known as: PLAVIX Take 75 mg by mouth in the morning.   feeding supplement Liqd Take 237 mLs by mouth 2 (two) times daily between meals.   gabapentin 100 MG capsule Commonly known as: NEURONTIN Take 1 capsule (100 mg total) by mouth 2 (two) times daily.   glipiZIDE 10 MG 24 hr tablet Commonly known as: GLUCOTROL XL Take 10 mg by mouth daily with breakfast.   levothyroxine 50 MCG tablet Commonly known as: SYNTHROID Take 50 mcg by mouth daily before breakfast.   metFORMIN 500 MG tablet Commonly known as: GLUCOPHAGE Take 1,000 mg by mouth 2 (two) times daily.   mirtazapine 15 MG tablet Commonly known as: REMERON Take 15 mg by mouth at bedtime.   omeprazole 20 MG capsule Commonly known as: PRILOSEC Take 20 mg by mouth daily before breakfast.   ondansetron 4 MG disintegrating tablet Commonly known as: ZOFRAN-ODT Take 1 tablet (4 mg total) by mouth every 6 (six) hours as needed for nausea.   ondansetron 4 MG tablet Commonly known as: ZOFRAN Take 4 mg by mouth every 6 (six) hours as needed for nausea.   rosuvastatin 10 MG tablet Commonly known as: CRESTOR Take 1 tablet (10 mg total) by mouth daily.   sertraline 25 MG tablet Commonly known as: ZOLOFT Take 25 mg by mouth in the morning.   zinc oxide 20 % ointment Apply 1 Application topically 3 (three) times daily as needed (for skin breakdown- buttocks/sacrum area).       Disposition: ALF/ILF Diet recommendation: Regular diet  Discharge Exam: Vitals:   11/14/22 0623 11/14/22 1411 11/14/22 1953 11/15/22 0443  BP: 107/80 (!) 110/32 104/85 119/82  Pulse: 82 90 86 88  Resp: 18 18 18 18   Temp: (!) 97.5 F (36.4 C) (!) 97.5 F (36.4  C) 98.6 F (37 C) 97.7 F (36.5 C)  TempSrc: Oral Oral Oral Oral  SpO2: 94% 94% 93% 93%  Weight:      Height:       General: Appear in no distress; no visible Abnormal Neck Mass Or lumps, Conjunctiva normal Cardiovascular: S1 and S2 Present, no Murmur, Respiratory: good respiratory effort, Bilateral Air entry present and CTA, no Crackles, no wheezes Abdomen: Bowel Sound present, Non tender  Extremities: no Pedal edema Neurology: alert and oriented to time, place, and person  [facility] Weights   11/11/22 1924  Weight: 72.6 kg   Condition at discharge: stable  The results of significant diagnostics from this hospitalization (including imaging, microbiology, ancillary and laboratory) are listed below for reference.   Imaging Studies: CT ABDOMEN PELVIS W CONTRAST  Result Date: 11/11/2022 CLINICAL DATA:  Abdominal pain, acute, nonlocalized.  Diarrhea. EXAM: CT ABDOMEN AND PELVIS WITH CONTRAST TECHNIQUE: Multidetector CT imaging of the abdomen and pelvis was performed using  the standard protocol following bolus administration of intravenous contrast. RADIATION DOSE REDUCTION: This exam was performed according to the departmental dose-optimization program which includes automated exposure control, adjustment of the mA and/or kV according to patient size and/or use of iterative reconstruction technique. CONTRAST:  OMNIPAQUE IOHEXOL 300 MG/ML  SOLN COMPARISON:  CT scan abdomen and pelvis from 02/10/2022. FINDINGS: Lower chest: There are subpleural atelectatic changes in the visualized lung bases. No overt consolidation. No pleural effusion. The heart is normal in size. No pericardial effusion. Hepatobiliary: The liver is normal in size. Non-cirrhotic configuration. No suspicious mass. These is mild diffuse hepatic steatosis. No intrahepatic or extrahepatic bile duct dilation. The gallbladder is physiologically distended and exhibits small volume dependent calcified gallstones. No abnormal wall  thickening or pericholecystic inflammatory changes. Pancreas: Unremarkable. No pancreatic ductal dilatation or surrounding inflammatory changes. Spleen: Within normal limits. No focal lesion. Adrenals/Urinary Tract: Stable bilateral adrenal glands. No suspicious renal mass. There is a 1.6 x 2.0 cm simple cyst in the left kidney upper pole. No hydronephrosis. There are stable calcifications in the left kidney upper pole, likely vascular. Otherwise, no renal or ureteric calculi. Small areas of focal scarring noted in bilateral kidneys. Urinary bladder is distended and exhibits air within its wall, compatible with emphysematous cystitis. No focal mass or calculi. Stomach/Bowel: Small to medium diverticulum noted arising from the proximal duodenum. No disproportionate dilation of the small or large bowel loops. No evidence of abnormal bowel wall thickening or inflammatory changes. The appendix is unremarkable. There are multiple diverticula mainly in the left hemi colon, without imaging signs of diverticulitis. Vascular/Lymphatic: No ascites or pneumoperitoneum. No abdominal or pelvic lymphadenopathy, by size criteria. No aneurysmal dilation of the major abdominal arteries. There are marked peripheral atherosclerotic vascular calcifications of the aorta and its major branches. Reproductive: The uterus is surgically absent. No large adnexal mass. Other: There is a tiny fat containing umbilical hernia. The soft tissues and abdominal wall are otherwise unremarkable. Musculoskeletal: No suspicious osseous lesions. There are moderate - marked multilevel degenerative changes in the visualized spine. IMPRESSION: 1. Emphysematous cystitis, which is due to gas-forming infection of the urinary bladder wall. 2. Cholelithiasis. No CT evidence of acute cholecystitis. 3. Multiple other nonacute observations, as described above. Aortic Atherosclerosis (ICD10-I70.0) and Emphysema (ICD10-J43.9). Electronically Signed   By: Jules Schick M.D.   On: 11/11/2022 14:28    Microbiology: Results for orders placed or performed during the hospital encounter of 11/11/22  Urine Culture     Status: Abnormal   Collection Time: 11/11/22 12:56 PM   Specimen: Urine, Catheterized  Result Value Ref Range Status   Specimen Description   Final    URINE, CATHETERIZED Performed at [facility], [address], [place], [place] [address]    Special Requests   Final    NONE Performed at [facility], [address] [place], [place] [address]    Culture >=100,000 COLONIES/mL KLEBSIELLA PNEUMONIAE (A)  Final   Report Status 11/13/2022 FINAL  Final   Organism ID, Bacteria KLEBSIELLA PNEUMONIAE (A)  Final      Susceptibility   Klebsiella pneumoniae - MIC*    AMPICILLIN RESISTANT Resistant     CEFAZOLIN <=4 SENSITIVE Sensitive     CEFEPIME <=0.12 SENSITIVE Sensitive     CEFTRIAXONE <=0.25 SENSITIVE Sensitive     CIPROFLOXACIN <=0.25 SENSITIVE Sensitive     GENTAMICIN <=1 SENSITIVE Sensitive     IMIPENEM <=0.25 SENSITIVE Sensitive     NITROFURANTOIN  128 RESISTANT Resistant     TRIMETH/SULFA <=20 SENSITIVE Sensitive     AMPICILLIN/SULBACTAM 4 SENSITIVE Sensitive     PIP/TAZO <=4 SENSITIVE Sensitive     * >=100,000 COLONIES/mL KLEBSIELLA PNEUMONIAE  MRSA Next Gen by PCR, Nasal     Status: Abnormal   Collection Time: 11/12/22  1:33 AM   Specimen: Nasal Mucosa; Nasal Swab  Result Value Ref Range Status   MRSA by PCR Next Gen DETECTED (A) NOT DETECTED Final    Comment: RESULT CALLED TO, READ BACK BY AND VERIFIED WITH: GARDEN, T. RN AT 8119 ON 11/12/2022 BY MECIAL J. (NOTE) The GeneXpert MRSA Assay (FDA approved for NASAL specimens only), is one component of a comprehensive MRSA colonization surveillance program. It is not intended to diagnose MRSA infection nor to guide or monitor treatment for MRSA infections. Test performance is not FDA approved in patients less than 30 years old. Performed  at [facility], [address] [place], [place] [address]    Labs: CBC: Recent Labs  Lab 11/11/22 1028 11/12/22 0639  WBC 11.5* 7.9  NEUTROABS 10.2*  --   HGB 13.6 12.6  HCT 42.5 39.2  MCV 96.4 95.6  PLT 339 313   Basic Metabolic Panel: Recent Labs  Lab 11/11/22 1028  NA 138  K 5.0  CL 105  CO2 22  GLUCOSE 180*  BUN 24*  CREATININE 1.06*  CALCIUM 9.2   Liver Function Tests: Recent Labs  Lab 11/11/22 1028  AST 15  ALT 11  ALKPHOS 61  BILITOT 0.9  PROT 7.1  ALBUMIN 3.4*   CBG: Recent Labs  Lab 11/14/22 0816 11/14/22 1253 11/14/22 1640 11/14/22 2126 11/15/22 0757  GLUCAP 127* 109* 107* 175* 144*    Discharge time spent: greater than 30 minutes.  Author: Lynden Oxford, MD  [facility]

## 2022-11-15 NOTE — TOC Transition Note (Addendum)
Transition of Care [facility]) - CM/SW Discharge Note   Patient Details  Name: Paula Hansen MRN: [id number] Date of Birth: 18-Aug-1934  Transition of Care [facility]) CM/SW Contact:  Darleene Cleaver, LCSW Phone Number: 11/15/2022, 2:18 PM   Clinical Narrative:     CSW was informed that patient is medically ready for discharge today back to [facility] memory care ALF.  CSW spoke to Sue Lush [phone], and she said patient can return today, she just needs the FL2, and DC summary emailed to her at [email or web address].  CSW emailed requested paperwork to facility.    CSW was informed that patient is on 1L of oxygen for comfort, CSW updated [facility] liasion Glenna Fellows, which patient is a current hospice patient with them.  Shawn notified DME company and they will arrange to have oxygen delivered before 4pm today.  CSW updated patient's son Onalee Hua, [phone], he is aware that patient is discharging today.    CSW contacted [facility] at 2:00pm, to set up transport for around 4pm.  Patient to be d/c'ed today to  [facility] room 162.  Patient and family agreeable to plans will transport via ems RN to call report to Gerrit Heck, [phone].    Final next level of care: Memory Care Barriers to Discharge: Barriers Resolved   Patient Goals and CMS Choice CMS Medicare.gov Compare Post Acute Care list provided to:: Patient Represenative (must comment) Choice offered to / list presented to : Adult Children  Discharge Placement                Patient chooses bed at: Other - please specify in the comment section below: [facility].) Patient to be transferred to facility by: [facility] Name of family member notified: Patient's son, Onalee Hua Patient and family notified of of transfer: 11/15/22  Discharge Plan and Services Additional resources added to the After Visit Summary for                  DME Arranged: Oxygen DME  Agency: Other - Comment Marcell Anger) Date DME Agency Contacted: 11/15/22 Time DME Agency Contacted: 1140 Representative spoke with at DME Agency: Shawn from [employer].   HH Agency: Other - See comment ([facility]) Date HH Agency Contacted: 11/15/22 Time HH Agency Contacted: 1140 Representative spoke with at [facility] Agency: Shawn from [employer].  Social Determinants of Health (SDOH) Interventions SDOH Screenings   Food Insecurity: No Food Insecurity (11/11/2022)  Housing: Low Risk  (11/11/2022)  Transportation Needs: No Transportation Needs (11/11/2022)  Utilities: Not At Risk (11/11/2022)  Tobacco Use: Medium Risk (11/11/2022)     Readmission Risk Interventions     No data to display

## 2022-11-15 NOTE — Progress Notes (Signed)
Report given to Gerrit Heck RN at [facility]. No more questions at this time. Patient pending transport to facility via non-emergency EMS.

## 2022-11-15 NOTE — Plan of Care (Signed)
  Problem: Coping: Goal: Ability to adjust to condition or change in health will improve Outcome: Progressing   Problem: Clinical Measurements: Goal: Ability to maintain clinical measurements within normal limits will improve Outcome: Progressing Goal: Will remain free from infection Outcome: Progressing   Problem: Safety: Goal: Ability to remain free from injury will improve Outcome: Progressing   Problem: Skin Integrity: Goal: Risk for impaired skin integrity will decrease Outcome: Progressing

## 2022-11-15 NOTE — Progress Notes (Signed)
Attempted to call facility Gerrit Heck at [phone] to give report on patient. Transferred to Gerrit Heck and got voicemail. Left message with phone number for nurse to call back and receive report on patient at her convenience. Will attempt to call again.

## 2022-11-24 LAB — GLUCOSE, CAPILLARY
Glucose-Capillary: 119 mg/dL — ABNORMAL HIGH (ref 70–99)
Glucose-Capillary: 115 mg/dL — ABNORMAL HIGH (ref 70–99)
Glucose-Capillary: 103 mg/dL — ABNORMAL HIGH (ref 70–99)

## 2023-01-23 DEATH — deceased

## 2023-01-27 ENCOUNTER — Ambulatory Visit (INDEPENDENT_AMBULATORY_CARE_PROVIDER_SITE_OTHER): Payer: Medicare Other | Admitting: Vascular Surgery

## 2023-01-27 ENCOUNTER — Encounter (INDEPENDENT_AMBULATORY_CARE_PROVIDER_SITE_OTHER): Payer: Medicare Other

## 2023-07-12 ENCOUNTER — Encounter (INDEPENDENT_AMBULATORY_CARE_PROVIDER_SITE_OTHER): Payer: Self-pay
